# Patient Record
Sex: Female | Born: 1955 | Race: White | Hispanic: No | Marital: Married | Smoking: Former smoker
Health system: Southern US, Community
[De-identification: ages and names within clinical notes are randomized; demographics above are authoritative.]

## PROBLEM LIST (undated history)

## (undated) DIAGNOSIS — E785 Hyperlipidemia, unspecified: Secondary | ICD-10-CM

## (undated) DIAGNOSIS — J302 Other seasonal allergic rhinitis: Secondary | ICD-10-CM

## (undated) DIAGNOSIS — Z8616 Personal history of COVID-19: Secondary | ICD-10-CM

## (undated) DIAGNOSIS — I10 Essential (primary) hypertension: Secondary | ICD-10-CM

## (undated) DIAGNOSIS — K219 Gastro-esophageal reflux disease without esophagitis: Secondary | ICD-10-CM

## (undated) DIAGNOSIS — I2510 Atherosclerotic heart disease of native coronary artery without angina pectoris: Secondary | ICD-10-CM

## (undated) HISTORY — DX: Hyperlipidemia, unspecified: E78.5

## (undated) HISTORY — DX: Personal history of COVID-19: Z86.16

## (undated) HISTORY — DX: Atherosclerotic heart disease of native coronary artery without angina pectoris: I25.10

---

## 1995-03-18 HISTORY — PX: OTHER SURGICAL HISTORY: SHX169

## 2005-10-21 ENCOUNTER — Ambulatory Visit: Payer: Self-pay | Admitting: Internal Medicine

## 2005-10-23 ENCOUNTER — Ambulatory Visit: Payer: Self-pay | Admitting: Internal Medicine

## 2005-10-23 ENCOUNTER — Ambulatory Visit (HOSPITAL_COMMUNITY): Admission: RE | Admit: 2005-10-23 | Discharge: 2005-10-23 | Payer: Self-pay | Admitting: Internal Medicine

## 2005-11-21 ENCOUNTER — Ambulatory Visit: Payer: Self-pay | Admitting: Internal Medicine

## 2010-03-17 HISTORY — PX: BACK SURGERY: SHX140

## 2010-07-12 ENCOUNTER — Ambulatory Visit (HOSPITAL_COMMUNITY)
Admission: RE | Admit: 2010-07-12 | Discharge: 2010-07-12 | Disposition: A | Discharge status: Home / Self Care | Payer: BC Managed Care – PPO | Source: Ambulatory Visit | Attending: Neurosurgery | Admitting: Neurosurgery

## 2010-07-12 ENCOUNTER — Encounter (HOSPITAL_COMMUNITY)
Admission: RE | Admit: 2010-07-12 | Discharge: 2010-07-12 | Disposition: A | Discharge status: Home / Self Care | Payer: BC Managed Care – PPO | Source: Ambulatory Visit | Attending: Neurosurgery | Admitting: Neurosurgery

## 2010-07-12 ENCOUNTER — Other Ambulatory Visit (HOSPITAL_COMMUNITY): Payer: Self-pay | Admitting: Neurosurgery

## 2010-07-12 DIAGNOSIS — R05 Cough: Secondary | ICD-10-CM | POA: Insufficient documentation

## 2010-07-12 DIAGNOSIS — M549 Dorsalgia, unspecified: Secondary | ICD-10-CM

## 2010-07-12 DIAGNOSIS — R059 Cough, unspecified: Secondary | ICD-10-CM | POA: Insufficient documentation

## 2010-07-12 DIAGNOSIS — Z01812 Encounter for preprocedural laboratory examination: Secondary | ICD-10-CM | POA: Insufficient documentation

## 2010-07-12 DIAGNOSIS — R0602 Shortness of breath: Secondary | ICD-10-CM | POA: Insufficient documentation

## 2010-07-12 DIAGNOSIS — Z01818 Encounter for other preprocedural examination: Secondary | ICD-10-CM | POA: Insufficient documentation

## 2010-07-12 LAB — BASIC METABOLIC PANEL
BUN: 11 mg/dL (ref 6–23)
CO2: 28 mEq/L (ref 19–32)
Calcium: 10.4 mg/dL (ref 8.4–10.5)
Chloride: 101 mEq/L (ref 96–112)
Creatinine, Ser: 0.88 mg/dL (ref 0.4–1.2)
GFR calc Af Amer: >60 mL/min (ref >60)
GFR calc non Af Amer: >60 mL/min (ref >60)
Glucose, Bld: 96 mg/dL (ref 70–99)
Potassium: 4.7 mEq/L (ref 3.5–5.1)
Sodium: 139 mEq/L (ref 135–145)

## 2010-07-12 LAB — CBC
HCT: 40.2 % (ref 36.0–46.0)
Hemoglobin: 13.3 g/dL (ref 12.0–15.0)
MCH: 28.9 pg (ref 26.0–34.0)
MCHC: 33.1 g/dL (ref 30.0–36.0)
MCV: 87.4 fL (ref 78.0–100.0)
Platelets: 214 10*3/uL (ref 150–400)
RBC: 4.6 MIL/uL (ref 3.87–5.11)
RDW: 13.8 % (ref 11.5–15.5)
WBC: 8.7 10*3/uL (ref 4.0–10.5)

## 2010-07-12 LAB — SURGICAL PCR SCREEN
MRSA, PCR: NEGATIVE
Staphylococcus aureus: NEGATIVE

## 2010-07-12 IMAGING — CR DG CHEST 2V
2 series · 2 of 2 positions shown · non-contrast
Comparison: None

CLINICAL DATA: preop radiograph.  Shortness of breath.  Dry cough.

CHEST - 2 VIEW

[view not recorded (1 of 2)]
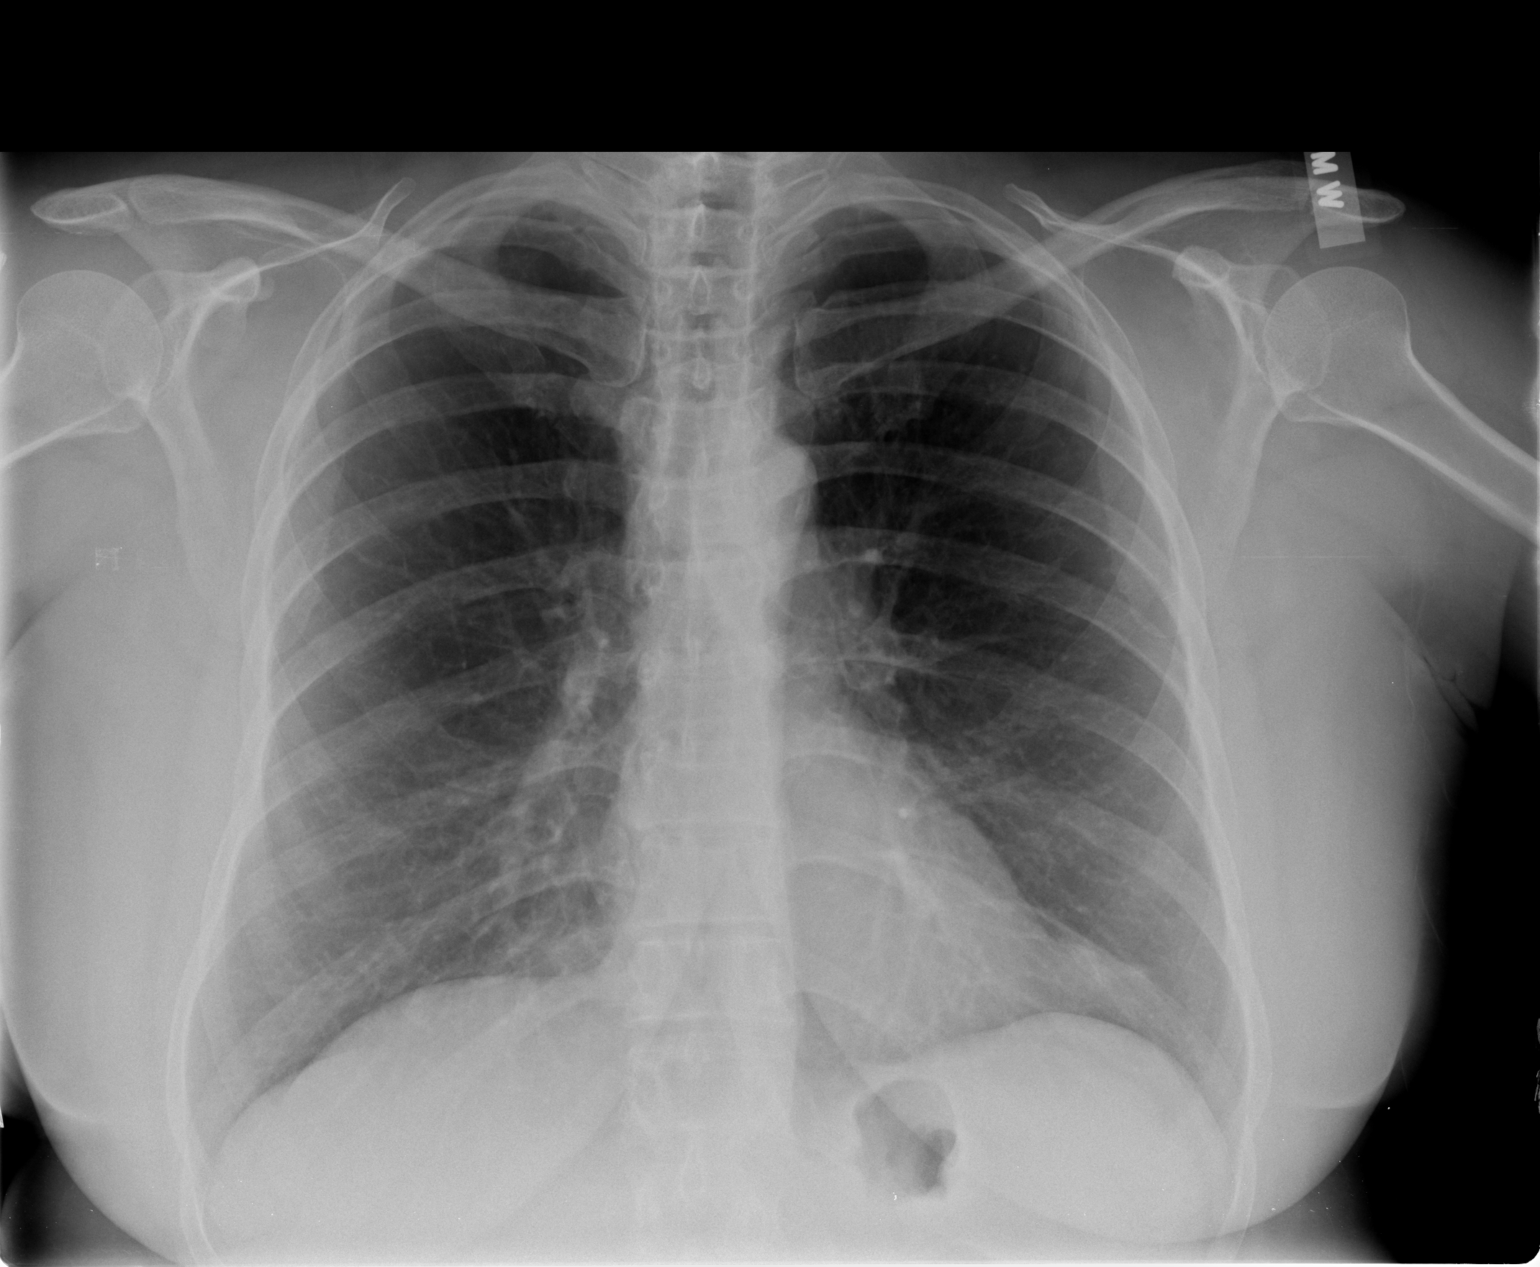

[view not recorded (2 of 2)]
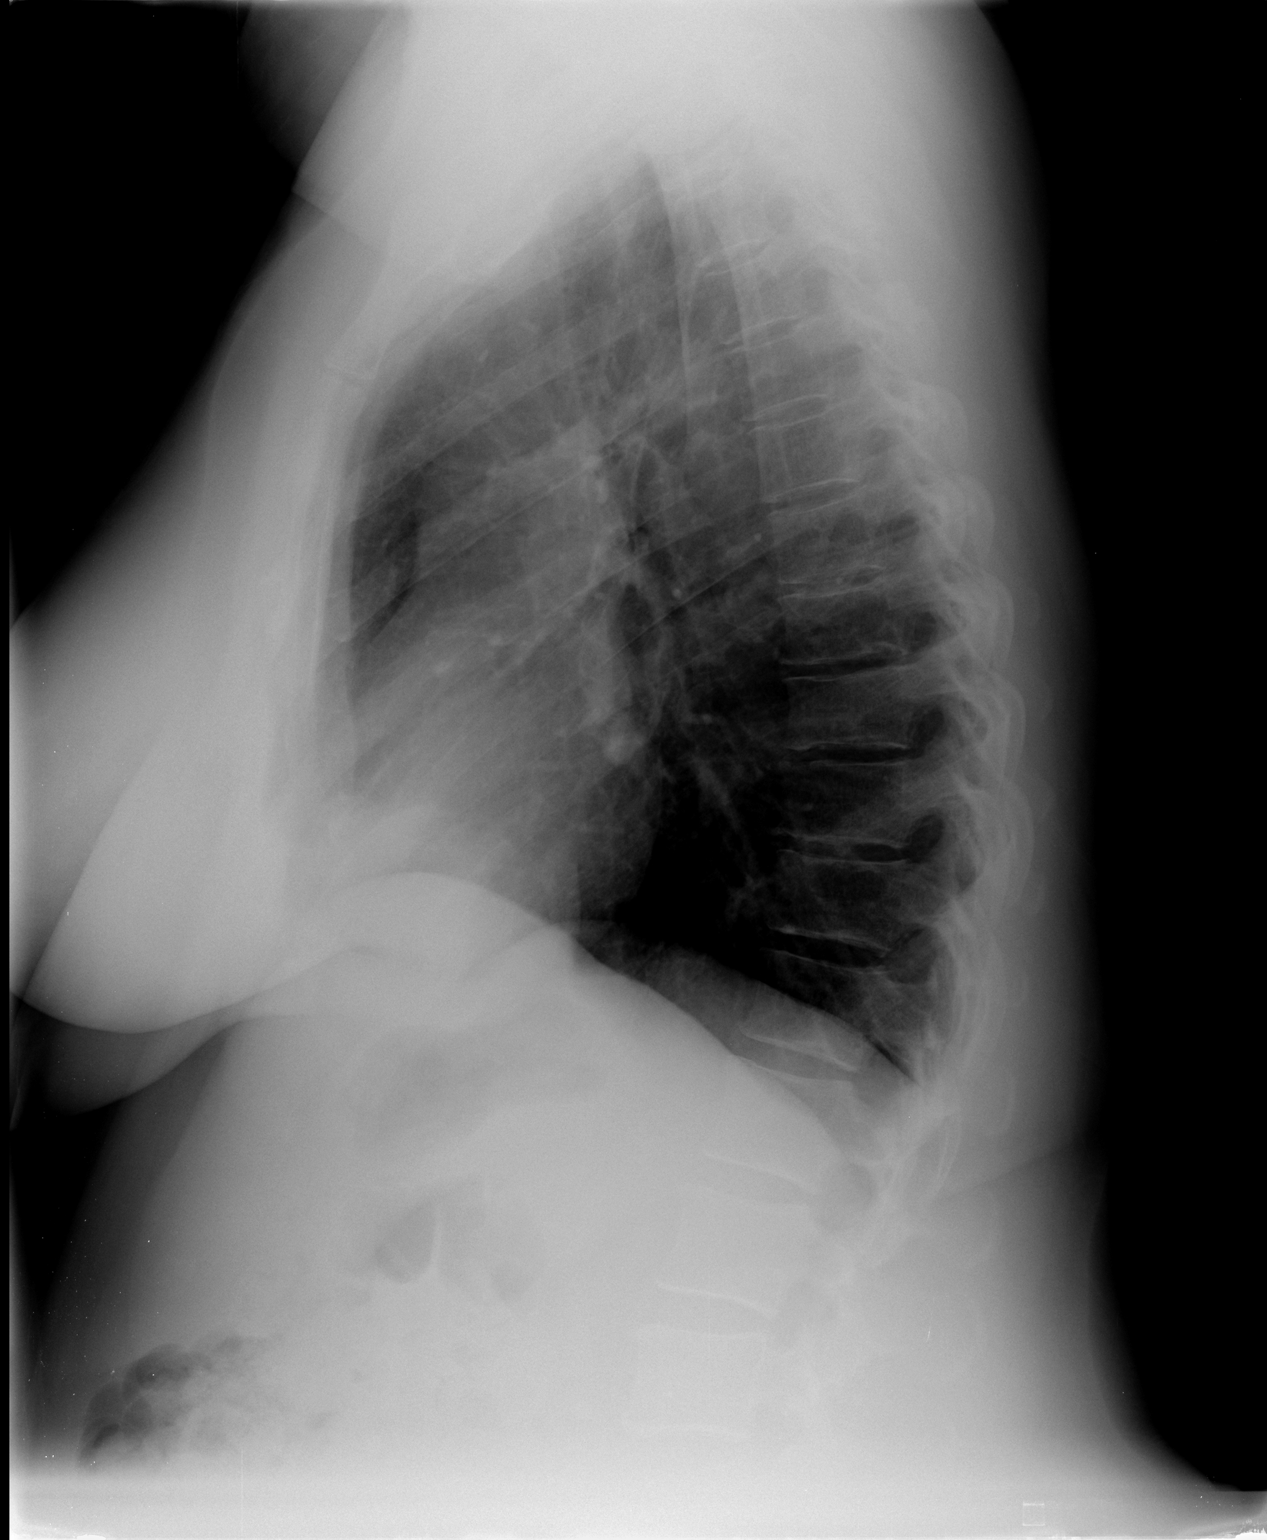

[2 of 2 positions shown; findings below may reference images not displayed]

FINDINGS: The heart size appears normal.

There is no pleural effusion or pulmonary edema

No airspace consolidation identified.

Mildly coarsened interstitial markings are identified bilaterally.

Review of the visualized osseous structures is unremarkable.
IMPRESSION: 1.  No acute cardiopulmonary abnormalities.
2.  Mild chronic coarsened interstitial markings.

## 2010-07-22 ENCOUNTER — Inpatient Hospital Stay (HOSPITAL_COMMUNITY): Payer: BC Managed Care – PPO

## 2010-07-22 ENCOUNTER — Observation Stay (HOSPITAL_COMMUNITY): Payer: BC Managed Care – PPO

## 2010-07-22 ENCOUNTER — Observation Stay (HOSPITAL_COMMUNITY)
Admission: RE | Admit: 2010-07-22 | Discharge: 2010-07-23 | Disposition: A | Discharge status: Home / Self Care | Payer: BC Managed Care – PPO | Source: Ambulatory Visit | Attending: Neurosurgery | Admitting: Neurosurgery

## 2010-07-22 DIAGNOSIS — M47817 Spondylosis without myelopathy or radiculopathy, lumbosacral region: Principal | ICD-10-CM | POA: Insufficient documentation

## 2010-07-22 DIAGNOSIS — M51379 Other intervertebral disc degeneration, lumbosacral region without mention of lumbar back pain or lower extremity pain: Secondary | ICD-10-CM | POA: Insufficient documentation

## 2010-07-22 DIAGNOSIS — M5137 Other intervertebral disc degeneration, lumbosacral region: Secondary | ICD-10-CM | POA: Insufficient documentation

## 2010-07-22 DIAGNOSIS — Q762 Congenital spondylolisthesis: Secondary | ICD-10-CM | POA: Insufficient documentation

## 2010-07-22 LAB — ABO/RH: ABO/RH(D): A POS

## 2010-07-22 LAB — TYPE AND SCREEN
ABO/RH(D): A POS
Antibody Screen: NEGATIVE

## 2010-07-22 IMAGING — CR DG LUMBAR SPINE 2-3V
1 series · 1 of 1 positions shown · non-contrast
Comparison: None.

CLINICAL DATA: L4-5 PLIF.

OPERATIVE LUMBAR SPINE - 2-3 VIEW [DATE]:

[view not recorded]
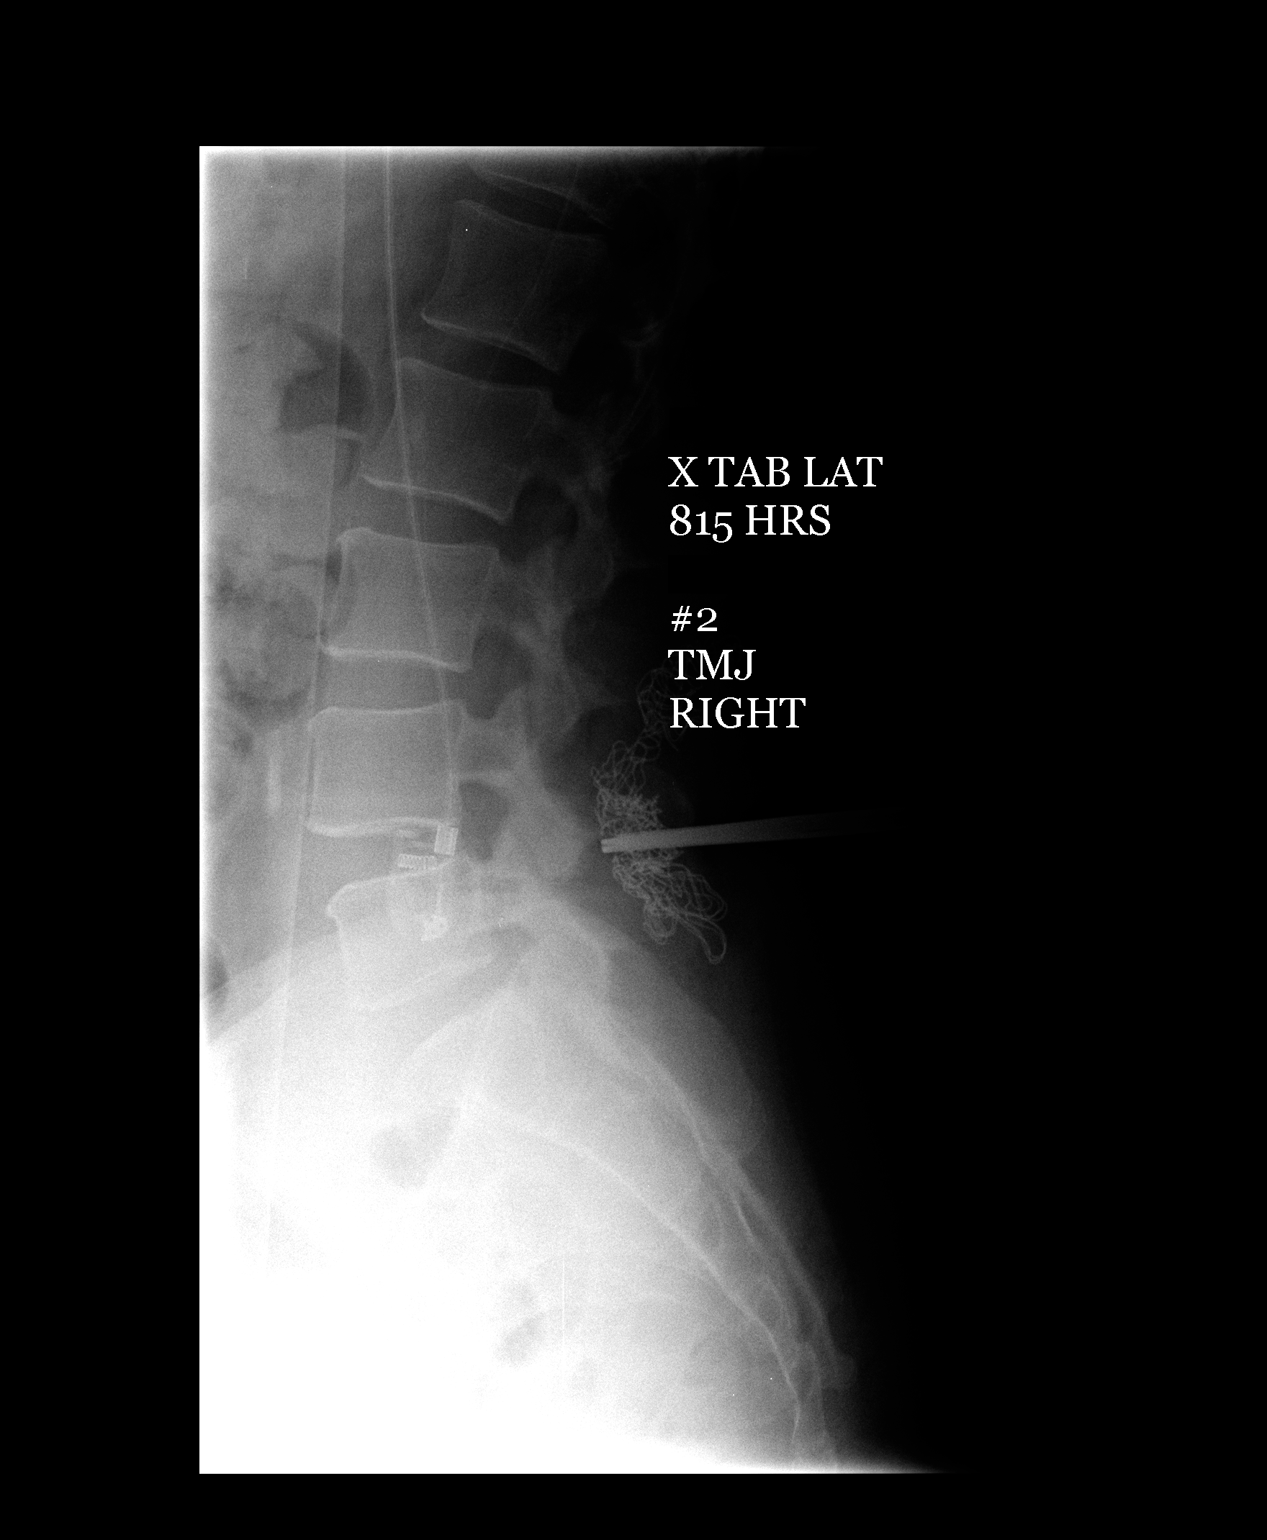

[1 of 1 positions shown; findings below may reference images not displayed]

FINDINGS: The purposes of this dictation, I am going to assume five
non-rib bearing lumbar vertebra.  Images were submitted for
interpretation post-operatively.  Initial image obtained at [DH]
hours demonstrates the localizer needle adjacent to the L3 spinous
process.  Second image obtained at [DH] hours demonstrates the
localizer device directed toward the L4-5 disc space.  A surgical
sponge is noted at the operative site posteriorly.
IMPRESSION: L4-5 localized intraoperatively.

## 2010-07-22 IMAGING — RF DG LUMBAR SPINE 2-3V
1 series · 2 of 2 positions shown · non-contrast
Comparison: None

CLINICAL DATA: L4-5 decompression.

LUMBAR SPINE - 2-3 VIEW

[Series 1: run · 2 of 2 slices shown]
[im 1/2]
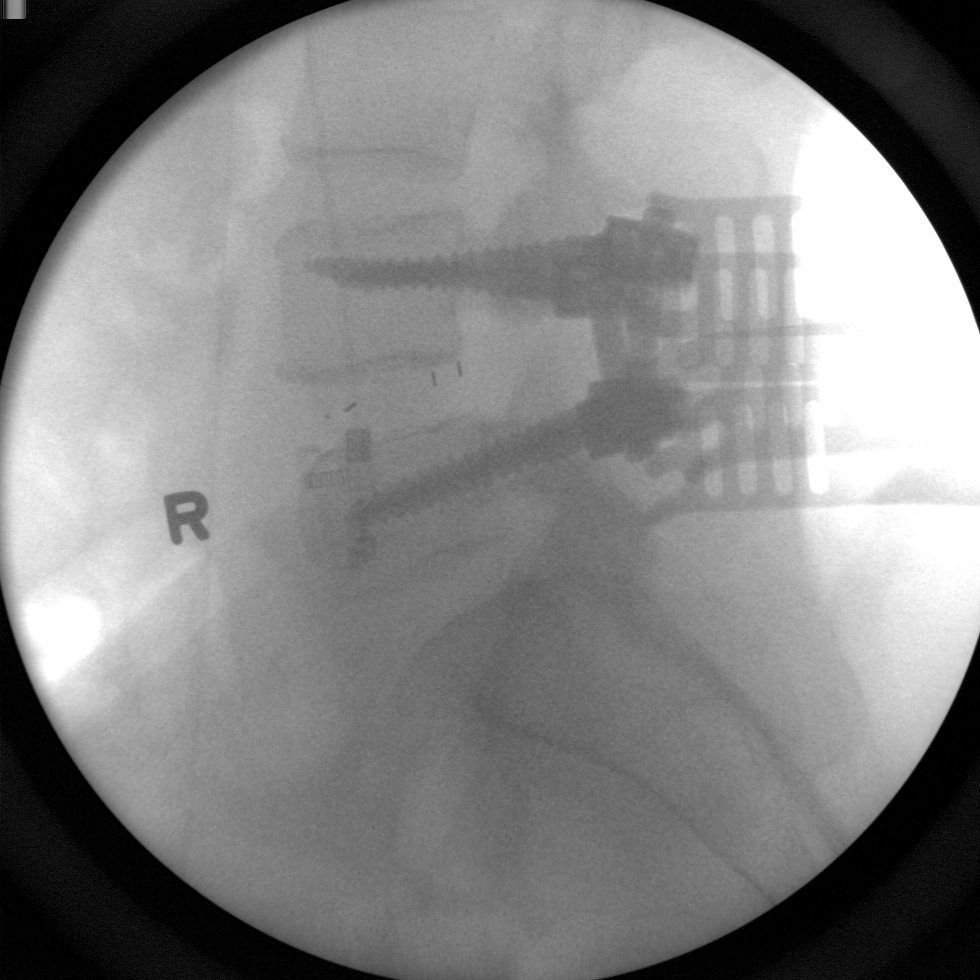
[im 2/2]
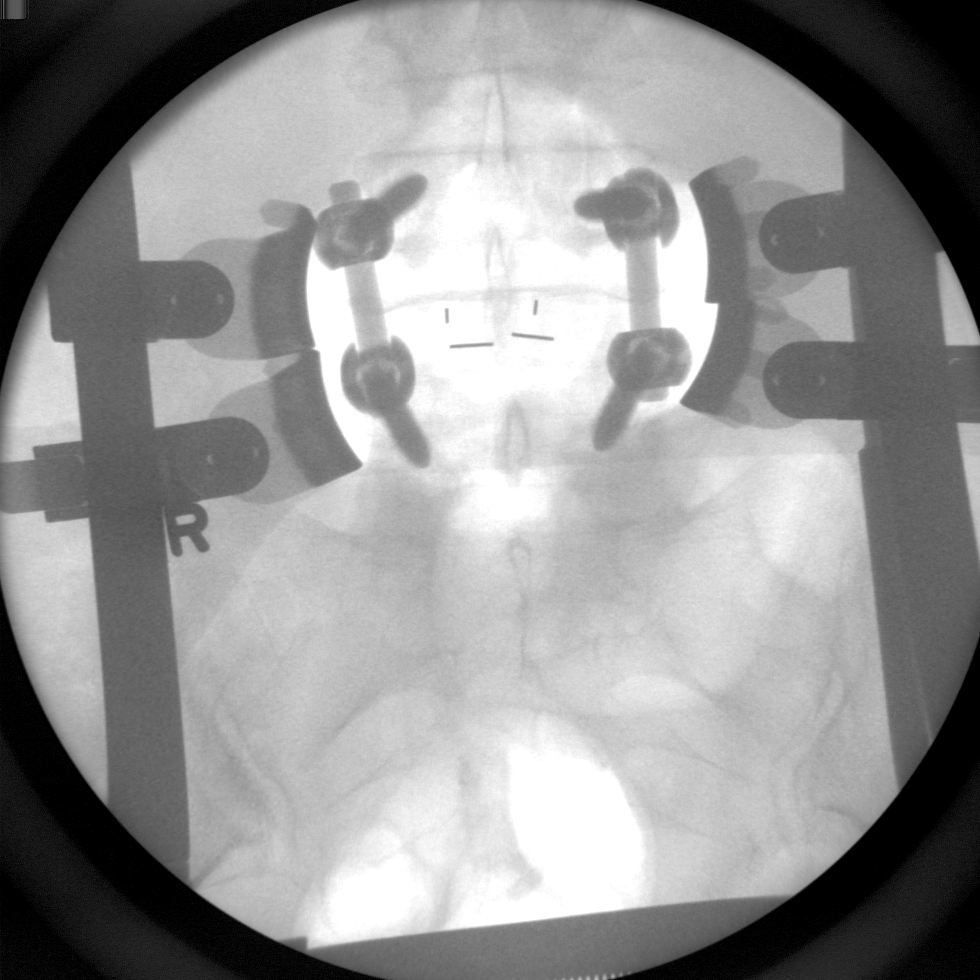

[2 of 2 positions shown; findings below may reference images not displayed]

FINDINGS: Two intraoperative spot images demonstrate changes of
posterior fusion at L4-5.  Normal alignment.  No complicating
feature.
IMPRESSION: L4-5 posterior fusion.

## 2010-07-27 NOTE — Op Note (Signed)
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  POSTOPERATIVE DIAGNOSES:  L4-5 lumbar stenosis, L4-5 dynamic degenerative grade 1 spondylolisthesis, lumbar spondylosis, and lumbar degenerative disk disease.  PROCEDURES:  Bilateral L4-5 lumbar decompression including bilateral lumbar laminotomy, facetectomy, and foraminotomy with decompression of the exiting L4 and L5 nerve roots bilaterally with decompression beyond that required for interbody arthrodesis with the operating microscope and microdissection and microsurgical technique, bilateral L4-5 posterior lumbar interbody arthrodesis with a AVS PEEK interbody implants and Vitoss with bone marrow aspirate and Infuse and bilateral L4-5 posterolateral arthrodesis with radius posterior instrumentation, Vitoss with bone marrow aspirate, and Infuse.  SURGEON:  Hewitt Shorts, MD  ASSISTANT:  Stefani Dama, MD  ANESTHESIA:  General endotracheal.  INDICATIONS:  The patient is a 55 year old woman who presents with right lumbar radicular pain.  She is found to have a grade 1 dynamic degenerative spondylolisthesis of L4-5 with significant stenosis secondary to advance facet arthropathy.  Intraoperatively, we found bilateral synovial cyst emanating from the arthritic facet arthropathy. Decision was made to proceed with decompression and arthrodesis.  PROCEDURE:  The patient was brought to the operating room and placed under general endotracheal anesthesia.  The patient was turned to  prone position.  Lumbar region was prepped with Betadine soap and solution, draped in a sterile fashion.  The midline was infiltrated with local anesthetic with epinephrine.  X-ray was taken and the L4-5 level was identified.  The midline incision was made over the L4-5 level and carried down through the subcutaneous tissue.  Bipolar cautery and electrocautery was used to maintain hemostasis.  Dissection was carried down to the lumbar fascia which was incised bilaterally.  The paraspinal muscles were dissected from the spinous process and lamina in a subperiosteal fashion.  Self-retaining retractor was placed in the L4-5. Anterior laminar space was identified and then we proceeded with decompression using the operating microscope and microdissection and microsurgical technique.  Bilateral laminotomy, facetectomy, and foraminotomy was performed.  Ligamentum flavum was carefully removed. There were bilateral synovial cysts.  The cyst on the left side was somewhat adherent to the dura and a small rent in the dura was closed aswe removed the synovial cyst.  This was closed with 6-0 Prolene suture. At the end of procedure, we laid a layer of Durafoam over the dural repair.  We continued decompression laterally exposing the exiting L5 nerve roots.  Medial facetectomy was performed.  A complete L4 inferior facetectomy was performed.  We then removed the thickened ligamentum flavum laterally exposing and decompressing the L4 nerve roots bilaterally and good decompression of the thecal sac and nerve roots achieved.  We then proceeded with the interbody arthrodesis.  Epidural veins  and the ventral epidural space were coagulated and divided and then we identified the annulus and it was incised bilaterally and the disk space entered.  A thorough diskectomy was performed using variety of pituitary rongeurs and microcurettes.  The cartilaginous endplates were carefully removed using paddle curettes  and we sized the interbody space using sizers and selected 12-mm in height PEEK implants.  We then did brought the C-arm fluoroscope into the field.  It was draped in a sterile fashion and using C-arm fluoroscopic guidance we identified pedicle entry sites for L4 and L5 bilaterally.  We first probed the left L5 pedicle, aspirated bone marrow aspirate, and was injected over 10 mL strip of Vitoss.  We then went ahead and probed the other three pedicles.  Each was examined with the ball probe.  Good bony surfaces were noted.  Each was tapped with a 5.25-mm tap.  Good threading was noted.  Good bony surfaces noted and then we placed 5.75 x 40 mm screws bilaterally at each level.  We then packed the PEEK implants with a combination of Vitoss with bone marrow aspirate and infuse.  We placed the first implant on the right side.  It was countersunk.  We then went to the left side, packed the midline with a combination of Infuse and Vitoss with bone marrow aspirate and then placed the left-sided implant again retracting thecal sac and nerve root away of the way.  We packed the interbody space lateral to each of the PEEK implants with additional Vitoss with bone marrow aspirates.  We then packed the lateral gutter overlying the transverse process of L4 and L5 which had been previously exposed and decorticated and intertransverse space with a combination of Infuse and Vitoss with bone marrow aspirate.  We then selected 40-mm rods.  They were preloaded.  They were positioned within the screw heads and secured with locking caps and once all four locking caps were placed, final tightening was performed against counter torque.  The wound was irrigated extensively with bacitracin solution. Hemostasis was established and confirmed.  The Durafoam was placed over the dural repair and then we proceeded with closure.  Deep fascia was closed with interrupted undyed #1 Vicryl sutures.  Scarpa fascia  was closed with interrupted undyed #1 Vicryl sutures.  Subcutaneous and subcuticular were closed with interrupted inverted 2-0 undyed Vicryl sutures.  Skin was reapproximated with Dermabond.  Once Dermabond was dried, a layer of gauze and 4-inch Hypafix was applied as a dressing. Following surgery, the patient was turned back to supine position, reversed from the anesthetic, extubated, and transferred to the recovery room for further care where she was noted removing all 4 extremities to command.  Estimated blood loss was 150 mL.  We did use a Cell Saver during the procedure, but the Cell Saver technician felt that there was insufficient blood loss to process the collected blood.  Sponge and needle counts were correct.     Hewitt Shorts, M.D.     RWN/MEDQ  D:  07/22/2010  T:  07/23/2010  Job:  [id number]  Electronically Signed by Shirlean Kelly M.D. on 07/27/2010 11:03:45 AM

## 2011-03-18 HISTORY — PX: VEIN SURGERY: SHX48

## 2012-03-17 HISTORY — PX: ABDOMINAL HYSTERECTOMY: SHX81

## 2015-09-24 ENCOUNTER — Telehealth: Payer: Self-pay | Admitting: Internal Medicine

## 2015-09-24 NOTE — Telephone Encounter (Signed)
RECALL FOR TCS °

## 2015-09-25 NOTE — Telephone Encounter (Signed)
Tried to call with no answer  

## 2015-09-25 NOTE — Telephone Encounter (Signed)
Letter mailed for pt to call.  

## 2018-11-29 ENCOUNTER — Encounter: Payer: Self-pay | Admitting: *Deleted

## 2018-12-20 ENCOUNTER — Ambulatory Visit (INDEPENDENT_AMBULATORY_CARE_PROVIDER_SITE_OTHER): Payer: Self-pay | Admitting: *Deleted

## 2018-12-20 ENCOUNTER — Other Ambulatory Visit: Payer: Self-pay

## 2018-12-20 DIAGNOSIS — Z1211 Encounter for screening for malignant neoplasm of colon: Secondary | ICD-10-CM

## 2018-12-20 MED ORDER — PEG 3350-KCL-NA BICARB-NACL 420 G PO SOLR: 4000.0000 mL | Freq: Once | ORAL | 0 refills | Status: AC

## 2018-12-20 NOTE — Progress Notes (Addendum)
Gastroenterology Pre-Procedure Review  Request Date: 12/20/2018 Requesting Physician: Etter Sjogren, NP @ [facility], Last TCS 2007 done by [facility], no report available  PATIENT REVIEW QUESTIONS: The patient responded to the following health history questions as indicated:    1. Diabetes Melitis: no 2. Joint replacements in the past 12 months: no 3. Major health problems in the past 3 months: no 4. Has an artificial valve or MVP: no 5. Has a defibrillator: no 6. Has been advised in past to take antibiotics in advance of a procedure like teeth cleaning: no 7. Family history of colon cancer: no  8. Alcohol Use: no 9. Illicit drug Use: no 10. History of sleep apnea: no  11. History of coronary artery or other vascular stents placed within the last 12 months: no 12. History of any prior anesthesia complications: no 13. There is no height or weight on file to calculate BMI. ht: 5'0 wt: 190 lbs    MEDICATIONS & ALLERGIES:    Patient reports the following regarding taking any blood thinners:   Plavix? no Aspirin? yes Coumadin? no Brilinta? no Xarelto? no Eliquis? no Pradaxa? no Savaysa? no Effient? no  Patient confirms/reports the following medications:  Current Outpatient Medications  Medication Sig Dispense Refill  . aspirin EC 81 MG tablet Take 81 mg by mouth daily.    . cetirizine (ZYRTEC) 10 MG tablet Take 10 mg by mouth daily.    . Cholecalciferol (VITAMIN D3) 50 MCG (2000 UT) TABS Take by mouth daily.    Marland Kitchen estradiol (ESTRACE) 0.5 MG tablet Take 0.5 mg by mouth daily.    Marland Kitchen lisinopril-hydrochlorothiazide (ZESTORETIC) 20-25 MG tablet Take 1 tablet by mouth daily.    . Potassium Aminobenzoate 500 MG CAPS Take by mouth daily.    . solifenacin (VESICARE) 10 MG tablet Take by mouth daily.    . Turmeric (QC TUMERIC COMPLEX) 500 MG CAPS Take by mouth daily.    Marland Kitchen UNABLE TO FIND 1 tablet daily. Vari-Gone     No current facility-administered medications for this visit.      Patient confirms/reports the following allergies:  Allergies  Allergen Reactions  . Penicillins Hives  . Vitamin B12 Nausea And Vomiting  . Codeine Palpitations    Other reaction(s): Irregular Heart Rate    No orders of the defined types were placed in this encounter.   AUTHORIZATION INFORMATION Primary Insurance: Medicare ,  ID [id number] Pre-Cert / Auth required: No, not required  Secondary Insurance: BCBS Supplement,  ID [id number] ,  Group #: [id number] Pre-Cert / Auth required: Not required per [facility] / Auth #: [id number]  SCHEDULE INFORMATION: Procedure has been scheduled as follows:  Date: 02/16/2019, Time: 2:00 Location: [facility] with Dr. Gala Romney  This Gastroenterology Pre-Precedure Review Form is being routed to the following provider(s): Neil Crouch, PA-C

## 2018-12-20 NOTE — Patient Instructions (Signed)
Carmen Morgan   65/17/1957 MRN: [id number]    Procedure Date: 02/16/2019 Time to register: 1:00 pm Place to register: [facility] Procedure Time: 2:00 pm Scheduled provider: Dr. Gala Romney  PREPARATION FOR COLONOSCOPY WITH TRI-LYTE SPLIT PREP  Please notify us immediately if you are diabetic, take iron supplements, or if you are on Coumadin or any other blood thinners.   You will need to purchase 1 fleet enema and 1 box of Bisacodyl 70m tablets.   2 DAYS BEFORE PROCEDURE:  DATE: 02/14/2019   DAY: Monday Begin clear liquid diet AFTER your lunch meal. NO SOLID FOODS after this point.  1 DAY BEFORE PROCEDURE:  DATE: 02/15/2019   DAY: Tuesday Continue clear liquids the entire day - NO SOLID FOOD.   At 2:00 pm:  Take 2 Bisacodyl tablets.   At 4:00pm:  Start drinking your solution. Make sure you mix well per instructions on the bottle. Try to drink 1 (one) 8 ounce glass every 10-15 minutes until you have consumed HALF the jug. You should complete by 6:00pm.You must keep the left over solution refrigerated until completed next day.  Continue clear liquids. You must drink plenty of clear liquids to prevent dehyration and kidney failure.     DAY OF PROCEDURE:   DATE: 02/16/2019   DAY: Wednesday If you take medications for your heart, blood pressure or breathing, you may take these medications.   Five hours before your procedure time @ 9:00 am:  Finish remaining amout of bowel prep, drinking 1 (one) 8 ounce glass every 10-15 minutes until complete. You have two hours to consume remaining prep.   Three hours before your procedure time @ 11:00 am:  Nothing by mouth.   At least one hour before going to the hospital:  Give yourself one Fleet enema. You may take your morning medications with sip of water unless we have instructed otherwise.      Please see below for Dietary Information.  CLEAR LIQUIDS INCLUDE:  Water Jello (NOT red in color)   Ice Popsicles (NOT red in color)    Tea (sugar ok, no milk/cream) Powdered fruit flavored drinks  Coffee (sugar ok, no milk/cream) Gatorade/ Lemonade/ Kool-Aid  (NOT red in color)   Juice: apple, white grape, white cranberry Soft drinks  Clear bullion, consomme, broth (fat free beef/chicken/vegetable)  Carbonated beverages (any kind)  Strained chicken noodle soup Hard Candy   Remember: Clear liquids are liquids that will allow you to see your fingers on the other side of a clear glass. Be sure liquids are NOT red in color, and not cloudy, but CLEAR.  DO NOT EAT OR DRINK ANY OF THE FOLLOWING:  Dairy products of any kind   Cranberry juice Tomato juice / V8 juice   Grapefruit juice Orange juice     Red grape juice  Do not eat any solid foods, including such foods as: cereal, oatmeal, yogurt, fruits, vegetables, creamed soups, eggs, bread, crackers, pureed foods in a blender, etc.   HELPFUL HINTS FOR DRINKING PREP SOLUTION:   Make sure prep is extremely cold. Mix and refrigerate the the morning of the prep. You may also put in the freezer.   You may try mixing some Crystal Light or Country Time Lemonade if you prefer. Mix in small amounts; add more if necessary.  Try drinking through a straw  Rinse mouth with water or a mouthwash between glasses, to remove after-taste.  Try sipping on a cold beverage /ice/ popsicles between glasses of  prep.  Place a piece of sugar-free hard candy in mouth between glasses.  If you become nauseated, try consuming smaller amounts, or stretch out the time between glasses. Stop for 30-60 minutes, then slowly start back drinking.        OTHER INSTRUCTIONS  You will need a responsible adult at least 65 years of age to accompany you and drive you home. This person must remain in the waiting room during your procedure. The hospital will cancel your procedure if you do not have a responsible adult with you.   1. Wear loose fitting clothing that is easily removed. 2. Leave jewelry and  other valuables at home.  3. Remove all body piercing jewelry and leave at home. 4. Total time from sign-in until discharge is approximately 2-3 hours. 5. You should go home directly after your procedure and rest. You can resume normal activities the day after your procedure. 6. The day of your procedure you should not:  Drive  Make legal decisions  Operate machinery  Drink alcohol  Return to work   You may call the office (Dept: [phone]) before 5:00pm, or page the doctor on call ([phone]) after 5:00pm, for further instructions, if necessary.   Insurance Information YOU WILL NEED TO CHECK WITH YOUR INSURANCE COMPANY FOR THE BENEFITS OF COVERAGE YOU HAVE FOR THIS PROCEDURE.  UNFORTUNATELY, NOT ALL INSURANCE COMPANIES HAVE BENEFITS TO COVER ALL OR PART OF THESE TYPES OF PROCEDURES.  IT IS YOUR RESPONSIBILITY TO CHECK YOUR BENEFITS, HOWEVER, WE WILL BE GLAD TO ASSIST YOU WITH ANY CODES YOUR INSURANCE COMPANY MAY NEED.    PLEASE NOTE THAT MOST INSURANCE COMPANIES WILL NOT COVER A SCREENING COLONOSCOPY FOR PEOPLE UNDER THE AGE OF 65  IF YOU HAVE BCBS INSURANCE, YOU MAY HAVE BENEFITS FOR A SCREENING COLONOSCOPY BUT IF POLYPS ARE FOUND THE DIAGNOSIS WILL CHANGE AND THEN YOU MAY HAVE A DEDUCTIBLE THAT WILL NEED TO BE MET. SO PLEASE MAKE SURE YOU CHECK YOUR BENEFITS FOR A SCREENING COLONOSCOPY AS WELL AS A DIAGNOSTIC COLONOSCOPY.  

## 2018-12-21 NOTE — Progress Notes (Signed)
Ok to schedule.

## 2018-12-22 NOTE — Addendum Note (Signed)
Addended by: Metro Kung on: 12/22/2018 09:32 AM   Modules accepted: Orders, SmartSet

## 2019-02-08 ENCOUNTER — Telehealth: Payer: Self-pay | Admitting: *Deleted

## 2019-02-08 NOTE — Telephone Encounter (Signed)
Noted  

## 2019-02-08 NOTE — Telephone Encounter (Signed)
Patient called and said disregard, her pharmacy has her prep

## 2019-02-08 NOTE — Telephone Encounter (Signed)
[phone]  PATIENT CALLED AND SAID THAT HER PHARMACY DOES NOT HAVE HER PREP

## 2019-02-14 ENCOUNTER — Other Ambulatory Visit (HOSPITAL_COMMUNITY)
Admission: RE | Admit: 2019-02-14 | Discharge: 2019-02-14 | Disposition: A | Discharge status: Home / Self Care | Payer: Medicare Other | Source: Ambulatory Visit | Attending: Internal Medicine | Admitting: Internal Medicine

## 2019-02-14 ENCOUNTER — Other Ambulatory Visit: Payer: Self-pay

## 2019-02-14 DIAGNOSIS — Z20828 Contact with and (suspected) exposure to other viral communicable diseases: Secondary | ICD-10-CM | POA: Insufficient documentation

## 2019-02-14 DIAGNOSIS — Z01812 Encounter for preprocedural laboratory examination: Secondary | ICD-10-CM | POA: Insufficient documentation

## 2019-02-14 LAB — SARS CORONAVIRUS 2 (TAT 6-24 HRS): SARS Coronavirus 2: NEGATIVE

## 2019-02-15 ENCOUNTER — Telehealth: Payer: Self-pay | Admitting: *Deleted

## 2019-02-15 NOTE — Telephone Encounter (Signed)
Spoke with pt this morning and she is aware that Day Surgery would like her to come in at 8:30 for her procedure due to a cancellation that they had.  Reviewed all necessary time changes on her prep instructions.  Pt voiced understanding.  Melanie in Endo notified that pt will be there at 8:30.

## 2019-02-16 ENCOUNTER — Other Ambulatory Visit: Payer: Self-pay

## 2019-02-16 ENCOUNTER — Encounter (HOSPITAL_COMMUNITY)
Admission: RE | Disposition: A | Discharge status: Home / Self Care | Payer: Self-pay | Source: Home / Self Care | Attending: Internal Medicine

## 2019-02-16 ENCOUNTER — Encounter (HOSPITAL_COMMUNITY): Payer: Self-pay | Admitting: *Deleted

## 2019-02-16 ENCOUNTER — Ambulatory Visit (HOSPITAL_COMMUNITY)
Admission: RE | Admit: 2019-02-16 | Discharge: 2019-02-16 | Disposition: A | Discharge status: Home / Self Care | Payer: Medicare Other | Attending: Internal Medicine | Admitting: Internal Medicine

## 2019-02-16 DIAGNOSIS — Z1211 Encounter for screening for malignant neoplasm of colon: Secondary | ICD-10-CM | POA: Diagnosis present

## 2019-02-16 DIAGNOSIS — I10 Essential (primary) hypertension: Secondary | ICD-10-CM | POA: Diagnosis not present

## 2019-02-16 DIAGNOSIS — Z79899 Other long term (current) drug therapy: Secondary | ICD-10-CM | POA: Insufficient documentation

## 2019-02-16 DIAGNOSIS — K219 Gastro-esophageal reflux disease without esophagitis: Secondary | ICD-10-CM | POA: Diagnosis not present

## 2019-02-16 DIAGNOSIS — K5730 Diverticulosis of large intestine without perforation or abscess without bleeding: Secondary | ICD-10-CM | POA: Diagnosis not present

## 2019-02-16 DIAGNOSIS — Z7982 Long term (current) use of aspirin: Secondary | ICD-10-CM | POA: Insufficient documentation

## 2019-02-16 DIAGNOSIS — Z87891 Personal history of nicotine dependence: Secondary | ICD-10-CM | POA: Insufficient documentation

## 2019-02-16 DIAGNOSIS — Z7951 Long term (current) use of inhaled steroids: Secondary | ICD-10-CM | POA: Insufficient documentation

## 2019-02-16 HISTORY — DX: Essential (primary) hypertension: I10

## 2019-02-16 HISTORY — DX: Other seasonal allergic rhinitis: J30.2

## 2019-02-16 HISTORY — DX: Gastro-esophageal reflux disease without esophagitis: K21.9

## 2019-02-16 HISTORY — PX: COLONOSCOPY: SHX5424

## 2019-02-16 SURGERY — COLONOSCOPY
Anesthesia: Moderate Sedation

## 2019-02-16 MED ORDER — SODIUM CHLORIDE 0.9 % IV SOLN
INTRAVENOUS | Status: DC
  Administered 2019-02-16: 09:00:00 via INTRAVENOUS

## 2019-02-16 MED ORDER — ONDANSETRON HCL 4 MG/2ML IJ SOLN
INTRAMUSCULAR | Status: DC | PRN
  Administered 2019-02-16: 4 mg via INTRAVENOUS

## 2019-02-16 MED ORDER — MIDAZOLAM HCL 5 MG/5ML IJ SOLN
INTRAMUSCULAR | Status: AC
  Filled 2019-02-16: qty 10

## 2019-02-16 MED ORDER — MIDAZOLAM HCL 5 MG/5ML IJ SOLN
INTRAMUSCULAR | Status: DC | PRN
  Administered 2019-02-16 (×2): 1 mg via INTRAVENOUS
  Administered 2019-02-16: 2 mg via INTRAVENOUS
  Administered 2019-02-16 (×2): 1 mg via INTRAVENOUS
  Administered 2019-02-16: 2 mg via INTRAVENOUS

## 2019-02-16 MED ORDER — MEPERIDINE HCL 100 MG/ML IJ SOLN
INTRAMUSCULAR | Status: DC | PRN
  Administered 2019-02-16: 10 mg via INTRAVENOUS
  Administered 2019-02-16: 25 mg via INTRAVENOUS
  Administered 2019-02-16: 15 mg via INTRAVENOUS

## 2019-02-16 MED ORDER — MEPERIDINE HCL 50 MG/ML IJ SOLN
INTRAMUSCULAR | Status: AC
  Filled 2019-02-16: qty 1

## 2019-02-16 MED ORDER — ONDANSETRON HCL 4 MG/2ML IJ SOLN
INTRAMUSCULAR | Status: AC
  Filled 2019-02-16: qty 2

## 2019-02-16 NOTE — Op Note (Signed)
[facility] Patient Name: Carmen Morgan Procedure Date: 02/16/2019 9:01 AM MRN: [id number] Date of Birth: [birth year] Attending MD: Norvel Richards , MD CSN: [id number] Age: 63 Admit Type: Outpatient Procedure:                Colonoscopy Indications:              Screening for colorectal malignant neoplasm Providers:                Norvel Richards, MD, Charlsie Quest. Theda Sers RN, RN,                            Aram Candela Referring MD:              Medicines:                Midazolam 8 mg IV, Meperidine 50 mg IV, Ondansetron                            4 mg IV Complications:            No immediate complications. Estimated Blood Loss:     Estimated blood loss: none. Procedure:                Pre-Anesthesia Assessment:                           - Prior to the procedure, a History and Physical                            was performed, and patient medications and                            allergies were reviewed. The patient's tolerance of                            previous anesthesia was also reviewed. The risks                            and benefits of the procedure and the sedation                            options and risks were discussed with the patient.                            All questions were answered, and informed consent                            was obtained. Prior Anticoagulants: The patient has                            taken no previous anticoagulant or antiplatelet                            agents. ASA Grade Assessment: II - A patient with  mild systemic disease. After reviewing the risks                            and benefits, the patient was deemed in                            satisfactory condition to undergo the procedure.                           After obtaining informed consent, the colonoscope                            was passed under direct vision. Throughout the                            procedure, the patient's  blood pressure, pulse, and                            oxygen saturations were monitored continuously. The                            CF-[id number] scope was introduced through                            the anus and advanced to the the cecum, identified                            by appendiceal orifice and ileocecal valve. The                            colonoscopy was performed without difficulty. The                            patient tolerated the procedure well. The quality                            of the bowel preparation was adequate. The                            ileocecal valve, appendiceal orifice, and rectum                            were photographed. Scope In: 9:21:45 AM Scope Out: 9:41:06 AM Scope Withdrawal Time: 0 hours 7 minutes 39 seconds  Total Procedure Duration: 0 hours 19 minutes 21 seconds  Findings:      The perianal and digital rectal examinations were normal. Somewhat of a       stiff noncompliant left colon.      Scattered medium-mouthed diverticula were found in the entire colon.      The exam was otherwise without abnormality on direct and retroflexion       views. Impression:               - Diverticulosis in the entire examined colon.  Noncompliant left colon.                           - The examination was otherwise normal on direct                            and retroflexion views.                           - No specimens collected. Patient related to me she                            is actually had quite a bit of bloating gas thanks                            of not been "right" since she had her hysterectomy. Moderate Sedation:      Moderate (conscious) sedation was administered by the endoscopy nurse       and supervised by the endoscopist. The following parameters were       monitored: oxygen saturation, heart rate, blood pressure, respiratory       rate, EKG, adequacy of pulmonary ventilation, and response to  care. Recommendation:           - Patient has a contact number available for                            emergencies. The signs and symptoms of potential                            delayed complications were discussed with the                            patient. Return to normal activities tomorrow.                            Written discharge instructions were provided to the                            patient.                           - Resume previous diet.                           - Continue present medications.                           - Repeat colonoscopy in 10 years for screening                            purposes.                           - Return to GI office in 2 months to address her  nonspecific GI symptoms.. Procedure Code(s):        --- Professional ---                           [phone], Colonoscopy, flexible; diagnostic, including                            collection of specimen(s) by brushing or washing,                            when performed (separate procedure) Diagnosis Code(s):        --- Professional ---                           Z12.11, Encounter for screening for malignant                            neoplasm of colon                           K57.30, Diverticulosis of large intestine without                            perforation or abscess without bleeding CPT copyright 2019 American [employer] Association. All rights reserved. The codes documented in this report are preliminary and upon coder review may  be revised to meet current compliance requirements. Cristopher Estimable. Letti Towell, MD Norvel Richards, MD 02/16/2019 9:54:17 AM This report has been signed electronically. Number of Addenda: 0

## 2019-02-16 NOTE — Discharge Instructions (Signed)
Colonoscopy Discharge Instructions  Read the instructions outlined below and refer to this sheet in the next few weeks. These discharge instructions provide you with general information on caring for yourself after you leave the hospital. Your doctor may also give you specific instructions. While your treatment has been planned according to the most current medical practices available, unavoidable complications occasionally occur. If you have any problems or questions after discharge, call Dr. Gala Romney at [phone]. ACTIVITY  You may resume your regular activity, but move at a slower pace for the next 24 hours.   Take frequent rest periods for the next 24 hours.   Walking will help get rid of the air and reduce the bloated feeling in your belly (abdomen).   No driving for 24 hours (because of the medicine (anesthesia) used during the test).    Do not sign any important legal documents or operate any machinery for 24 hours (because of the anesthesia used during the test).  NUTRITION  Drink plenty of fluids.   You may resume your normal diet as instructed by your doctor.   Begin with a light meal and progress to your normal diet. Heavy or fried foods are harder to digest and may make you feel sick to your stomach (nauseated).   Avoid alcoholic beverages for 24 hours or as instructed.  MEDICATIONS  You may resume your normal medications unless your doctor tells you otherwise.  WHAT YOU CAN EXPECT TODAY  Some feelings of bloating in the abdomen.   Passage of more gas than usual.   Spotting of blood in your stool or on the toilet paper.  IF YOU HAD POLYPS REMOVED DURING THE COLONOSCOPY:  No aspirin products for 7 days or as instructed.   No alcohol for 7 days or as instructed.   Eat a soft diet for the next 24 hours.  FINDING OUT THE RESULTS OF YOUR TEST Not all test results are available during your visit. If your test results are not back during the visit, make an appointment  with your caregiver to find out the results. Do not assume everything is normal if you have not heard from your caregiver or the medical facility. It is important for you to follow up on all of your test results.  SEEK IMMEDIATE MEDICAL ATTENTION IF:  You have more than a spotting of blood in your stool.   Your belly is swollen (abdominal distention).   You are nauseated or vomiting.   You have a temperature over 101.   You have abdominal pain or discomfort that is severe or gets worse throughout the day.    Diverticulosis information provided  Repeat colonoscopy in 10 years for screening purposes  Office visit with [place] in 2 months  At patient request, I called Raiyah Jankowiak, [phone] -called x2; unable to reach Mr. Draves   Diverticulosis  Diverticulosis is a condition that develops when small pouches (diverticula) form in the wall of the large intestine (colon). The colon is where water is absorbed and stool is formed. The pouches form when the inside layer of the colon pushes through weak spots in the outer layers of the colon. You may have a few pouches or many of them. What are the causes? The cause of this condition is not known. What increases the risk? The following factors may make you more likely to develop this condition:  Being older than age 71. Your risk for this condition increases with age. Diverticulosis is rare among people younger than  age 62. By age 36, many people have it.  Eating a low-fiber diet.  Having frequent constipation.  Being overweight.  Not getting enough exercise.  Smoking.  Taking over-the-counter pain medicines, like aspirin and ibuprofen.  Having a family history of diverticulosis. What are the signs or symptoms? In most people, there are no symptoms of this condition. If you do have symptoms, they may include:  Bloating.  Cramps in the abdomen.  Constipation or diarrhea.  Pain in the lower left side of the abdomen. How is  this diagnosed? This condition is most often diagnosed during an exam for other colon problems. Because diverticulosis usually has no symptoms, it often cannot be diagnosed independently. This condition may be diagnosed by:  Using a flexible scope to examine the colon (colonoscopy).  Taking an X-ray of the colon after dye has been put into the colon (barium enema).  Doing a CT scan. How is this treated? You may not need treatment for this condition if you have never developed an infection related to diverticulosis. If you have had an infection before, treatment may include:  Eating a high-fiber diet. This may include eating more fruits, vegetables, and grains.  Taking a fiber supplement.  Taking a live bacteria supplement (probiotic).  Taking medicine to relax your colon.  Taking antibiotic medicines. Follow these instructions at home:  Drink 6-8 glasses of water or more each day to prevent constipation.  Try not to strain when you have a bowel movement.  If you have had an infection before: ? Eat more fiber as directed by your health care provider or your diet and nutrition specialist (dietitian). ? Take a fiber supplement or probiotic, if your health care provider approves.  Take over-the-counter and prescription medicines only as told by your health care provider.  If you were prescribed an antibiotic, take it as told by your health care provider. Do not stop taking the antibiotic even if you start to feel better.  Keep all follow-up visits as told by your health care provider. This is important. Contact a health care provider if:  You have pain in your abdomen.  You have bloating.  You have cramps.  You have not had a bowel movement in 3 days. Get help right away if:  Your pain gets worse.  Your bloating becomes very bad.  You have a fever or chills, and your symptoms suddenly get worse.  You vomit.  You have bowel movements that are bloody or black.  You  have bleeding from your rectum. Summary  Diverticulosis is a condition that develops when small pouches (diverticula) form in the wall of the large intestine (colon).  You may have a few pouches or many of them.  This condition is most often diagnosed during an exam for other colon problems.  If you have had an infection related to diverticulosis, treatment may include increasing the fiber in your diet, taking supplements, or taking medicines. This information is not intended to replace advice given to you by your health care provider. Make sure you discuss any questions you have with your health care provider. Document Released: 11/29/2003 Document Revised: 02/13/2017 Document Reviewed: 01/21/2016 Elsevier Patient Education  2020 [employer].

## 2019-02-16 NOTE — H&P (Signed)
@LOGO @   Primary Care Physician:  Etter Sjogren, FNP Primary Gastroenterologist:  Dr. Gala Romney  Pre-Procedure History & Physical: HPI:  Carmen Morgan is a 63 y.o. female is here for a screening colonoscopy.  Reported negative colonoscopy 2007-records unavailable.  No family history colon cancer.  No bowel symptoms currently.  Past Medical History:  Diagnosis Date  . GERD (gastroesophageal reflux disease)   . Hypertension   . Seasonal allergies     Past Surgical History:  Procedure Laterality Date  . ABDOMINAL HYSTERECTOMY  2014  . BACK SURGERY  2012  . left breast cystectomy  1997  . VEIN SURGERY  2013    Prior to Admission medications   Medication Sig Start Date End Date Taking? Authorizing Provider  acetaminophen (TYLENOL) 500 MG tablet Take 1,000 mg by mouth every 6 (six) hours as needed for moderate pain or headache.   Yes [provider]  aspirin EC 81 MG tablet Take 81 mg by mouth daily.   Yes [provider]  cetirizine (ZYRTEC) 10 MG tablet Take 10 mg by mouth daily.   Yes [provider]  Cholecalciferol (VITAMIN D3) 50 MCG (2000 UT) TABS Take 2,000 Units by mouth daily.    Yes [provider]  estradiol (ESTRACE) 0.5 MG tablet Take 0.5 mg by mouth at bedtime.    Yes [provider]  famotidine (PEPCID) 20 MG tablet Take 20 mg by mouth daily as needed for heartburn or indigestion.   Yes [provider]  fluticasone (FLONASE) 50 MCG/ACT nasal spray Place 1 spray into both nostrils daily.   Yes [provider]  ibuprofen (ADVIL) 200 MG tablet Take 600-800 mg by mouth every 8 (eight) hours as needed for headache or moderate pain.   Yes [provider]  lisinopril-hydrochlorothiazide (ZESTORETIC) 20-25 MG tablet Take 1 tablet by mouth daily.   Yes [provider]  Melatonin 10 MG CAPS Take 20 mg by mouth at bedtime.   Yes [provider]  OVER THE COUNTER MEDICATION Take 1 capsule by  mouth daily. Vari-Gone otc supplement   Yes [provider]  Potassium Gluconate 550 (90 K) MG TABS Take 550 mg by mouth daily.   Yes [provider]  solifenacin (VESICARE) 10 MG tablet Take 10 mg by mouth daily.    Yes [provider]    Allergies as of 12/22/2018 - Review Complete 12/20/2018  Allergen Reaction Noted  . Penicillins Hives 10/29/2018  . Vitamin b12 Nausea And Vomiting 10/29/2018  . Codeine Palpitations 10/29/2018    Family History  Problem Relation Age of Onset  . Colon cancer Neg Hx     Social History   Socioeconomic History  . Marital status: Married    Spouse name: Not on file  . Number of children: Not on file  . Years of education: Not on file  . Highest education level: Not on file  Occupational History  . Not on file  Social Needs  . Financial resource strain: Not on file  . Food insecurity    Worry: Not on file    Inability: Not on file  . Transportation needs    Medical: Not on file    Non-medical: Not on file  Tobacco Use  . Smoking status: Former Research scientist (life sciences)  . Smokeless tobacco: Never Used  Substance and Sexual Activity  . Alcohol use: Never    Frequency: Never  . Drug use: Never  . Sexual activity: Not on file  Lifestyle  .  Physical activity    Days per week: Not on file    Minutes per session: Not on file  . Stress: Not on file  Relationships  . Social Herbalist on phone: Not on file    Gets together: Not on file    Attends religious service: Not on file    Active member of club or organization: Not on file    Attends meetings of clubs or organizations: Not on file    Relationship status: Not on file  . Intimate partner violence    Fear of current or ex partner: Not on file    Emotionally abused: Not on file    Physically abused: Not on file    Forced sexual activity: Not on file  Other Topics Concern  . Not on file  Social History Narrative  . Not on file    Review of Systems: See HPI,  otherwise negative ROS  Physical Exam: BP 132/77   Pulse 90   Temp 98.2 F (36.8 C) (Oral)   Resp 18   Ht 5' (1.524 m)   Wt 87.5 kg   SpO2 97%   BMI 37.69 kg/m  General:   Alert,  Well-developed, well-nourished, pleasant and cooperative in NAD Head:  Normocephalic and atraumatic. Lungs:  Clear throughout to auscultation.   No wheezes, crackles, or rhonchi. No acute distress. Heart:  Regular rate and rhythm; no murmurs, clicks, rubs,  or gallops. Abdomen:  Soft, nontender and nondistended. No masses, hepatosplenomegaly or hernias noted. Normal bowel sounds, without guarding, and without rebound.     Impression/Plan: Carmen Morgan is now here to undergo a screening colonoscopy.  Average rescreening examination.  Risks, benefits, limitations, imponderables and alternatives regarding colonoscopy have been reviewed with the patient. Questions have been answered. All parties agreeable.     Notice:  This dictation was prepared with Dragon dictation along with smaller phrase technology. Any transcriptional errors that result from this process are unintentional and may not be corrected upon review.

## 2019-02-18 ENCOUNTER — Encounter (HOSPITAL_COMMUNITY): Payer: Self-pay | Admitting: Internal Medicine

## 2019-04-19 ENCOUNTER — Ambulatory Visit: Payer: Medicare Other | Admitting: Nurse Practitioner

## 2019-06-16 ENCOUNTER — Ambulatory Visit (INDEPENDENT_AMBULATORY_CARE_PROVIDER_SITE_OTHER): Payer: Medicare Other | Admitting: Nurse Practitioner

## 2019-06-16 ENCOUNTER — Encounter: Payer: Self-pay | Admitting: Nurse Practitioner

## 2019-06-16 ENCOUNTER — Other Ambulatory Visit: Payer: Self-pay

## 2019-06-16 DIAGNOSIS — K5900 Constipation, unspecified: Secondary | ICD-10-CM | POA: Diagnosis not present

## 2019-06-16 DIAGNOSIS — R109 Unspecified abdominal pain: Secondary | ICD-10-CM

## 2019-06-16 DIAGNOSIS — K625 Hemorrhage of anus and rectum: Secondary | ICD-10-CM | POA: Diagnosis not present

## 2019-06-16 NOTE — Patient Instructions (Signed)
Your health issues we discussed today were:   Abdominal discomfort with constipation and rectal bleeding: 1. Some of your abdominal discomfort may be coming from constipation 2. Your rectal bleeding that you had 4 months ago was likely due to constipation as well 3. Start taking Colace 100 mg once a day, over-the-counter to see if this helps your constipation 4. You can also take MiraLAX once a day to twice a day as needed if Colace is not enough to have more regular bowel movements 5. If you start having diarrhea, back off on the medications 6. Call us if you have any worsening or severe symptoms  Overall I recommend:  1. Continue your other current medications 2. Return for follow-up in 6 months 3. Call us if you have any questions or concerns   ---------------------------------------------------------------  I am glad you got your Covid vaccine, keep your appointment for your second dose tomorrow.  Even when you are fully vaccinated he should continue to wear a mask, socially distance from those you do not live with, and wash your hands frequently.  ---------------------------------------------------------------   At [facility] we value your feedback. You may receive a survey about your visit today. Please share your experience as we strive to create trusting relationships with our patients to provide genuine, compassionate, quality care.  We appreciate your understanding and patience as we review any laboratory studies, imaging, and other diagnostic tests that are ordered as we care for you. Our office policy is 5 business days for review of these results, and any emergent or urgent results are addressed in a timely manner for your best interest. If you do not hear from our office in 1 week, please contact us.   We also encourage the use of MyChart, which contains your medical information for your review as well. If you are not enrolled in this feature, an access code  is on this after visit summary for your convenience. Thank you for allowing [place] to be involved in your care.  It was great to see you today!  I hope you have a great day!!

## 2019-06-16 NOTE — Assessment & Plan Note (Signed)
She had an episode of rectal bleeding in December, about 4 months ago, associated with constipation and straining.  Likely benign anorectal source.  Colonoscopy has recently been updated in early December 2020 and found to be normal with recommended 10-year repeat.  This is reassuring.  Further recommendations for constipation as per above.

## 2019-06-16 NOTE — Assessment & Plan Note (Signed)
The patient did admit mid to lower abdominal discomfort since hysterectomy in 2014.  It is not particularly severe.  She does have constipation going on as per above and this could be playing a role.  Also query possible scar tissue after abdominal hysterectomy.  I will try to treat her constipation as per above and see if this has an improvement.  Further recommendations to follow based on her clinical progression.  Follow-up in 6 months.

## 2019-06-16 NOTE — Assessment & Plan Note (Signed)
The patient describes constipation with a small and hard stools, although she typically has a bowel movement about 4 times a week.  She does not like she empties completely.  Possibly some straining.  Initially denied abdominal pain but towards the end of the visit she did states she has had abdominal discomfort since her hysterectomy but not at the incision.  Query possible scar tissue playing a role.  However, I will try to treat her constipation to see if this improves her abdominal discomfort.  I do not think she needs prescription medication at this time.  I will have her start Colace 100 mg once a day.  She can add MiraLAX 1-2 times a day as needed.  Return for follow-up in 6 months.  Call for any worsening or severe symptoms.

## 2019-06-16 NOTE — Progress Notes (Signed)
Referring Provider: Etter Sjogren, FNP Primary Care Physician:  Etter Sjogren, FNP Primary GI:  Dr. Gala Romney  Chief Complaint  Patient presents with  . Constipation    since TCS in decemeber. Had RB the week of christmas but none since. Has "hard balls"     HPI:   Carmen Morgan is a 64 y.o. female who presents for post procedure follow-up.  The patient was triaged on 12/20/2018 for colonoscopy.  Colonoscopy was completed 02/16/2019 which found diverticulosis in the entire examined colon, otherwise normal.  At that time she noted she has had quite a bit of bloating and gas and has not been "right" since her hysterectomy.  Recommend continue current medications, repeat colonoscopy in 10 years, follow-up in the office in 2 months.  Today she states she's doing ok. Has had constipation since her colonoscopy with small, hard stools consistent with Bristol 1. Prior to hysterectomy in 2014 was having Bristol 4 stools. After hysterectomy had diarrhea 3 times a week. This persisted since until colonoscopy. Has a bowel movement about four times a week, incomplete emptying. No persistent gas or bloating. Denies N/V (other than an episode 4 months ago). Denies hematochezia (other than an episode 4 months ago), melena, fever, chills, unintentional weight loss. Denies URI or flu-like symptoms. Denies loss of sense of taste or smell. Had COVID-19 in January. Had her first vaccine and has second dose tomorrow. Denies chest pain, dyspnea, dizziness, lightheadedness, syncope, near syncope. Denies any other upper or lower GI symptoms.   During the exam she states she has had abdominal soreness since hysterectomy.  Past Medical History:  Diagnosis Date  . GERD (gastroesophageal reflux disease)   . Hypertension   . Seasonal allergies     Past Surgical History:  Procedure Laterality Date  . ABDOMINAL HYSTERECTOMY  2014  . BACK SURGERY  2012  . COLONOSCOPY N/A 02/16/2019   Procedure: COLONOSCOPY;   Surgeon: Daneil Dolin, MD;  Location: [facility];  Service: Endoscopy;  Laterality: N/A;  2:00  . left breast cystectomy  1997  . VEIN SURGERY  2013    Current Outpatient Medications  Medication Sig Dispense Refill  . acetaminophen (TYLENOL) 500 MG tablet Take 1,000 mg by mouth every 6 (six) hours as needed for moderate pain or headache.    Marland Kitchen aspirin EC 81 MG tablet Take 81 mg by mouth daily.    . Cholecalciferol (VITAMIN D3) 50 MCG (2000 UT) TABS Take 2,000 Units by mouth daily.     Marland Kitchen estradiol (ESTRACE) 0.5 MG tablet Take 0.5 mg by mouth at bedtime.     . famotidine (PEPCID) 20 MG tablet Take 20 mg by mouth daily as needed for heartburn or indigestion.    . fexofenadine (ALLEGRA) 180 MG tablet Take 180 mg by mouth daily.    . fluticasone (FLONASE) 50 MCG/ACT nasal spray Place 1 spray into both nostrils daily.    Marland Kitchen ibuprofen (ADVIL) 200 MG tablet Take 600-800 mg by mouth every 8 (eight) hours as needed for headache or moderate pain.    Marland Kitchen lisinopril-hydrochlorothiazide (ZESTORETIC) 20-25 MG tablet Take 1 tablet by mouth daily.    . Melatonin 10 MG CAPS Take 20 mg by mouth at bedtime.    Marland Kitchen OVER THE COUNTER MEDICATION Take 1 capsule by mouth daily. Vari-Gone otc supplement    . Potassium Gluconate 550 (90 K) MG TABS Take 550 mg by mouth daily.    . solifenacin (VESICARE) 10 MG tablet Take 10  mg by mouth daily.      No current facility-administered medications for this visit.    Allergies as of 06/16/2019 - Review Complete 06/16/2019  Allergen Reaction Noted  . Penicillins Hives 10/29/2018  . Vitamin b12 Nausea And Vomiting 10/29/2018  . Codeine Palpitations 10/29/2018    Family History  Problem Relation Age of Onset  . Colon cancer Neg Hx     Social History   Socioeconomic History  . Marital status: Married    Spouse name: Not on file  . Number of children: Not on file  . Years of education: Not on file  . Highest education level: Not on file  Occupational History  .  Not on file  Tobacco Use  . Smoking status: Former Research scientist (life sciences)  . Smokeless tobacco: Never Used  Substance and Sexual Activity  . Alcohol use: Never  . Drug use: Never  . Sexual activity: Not on file  Other Topics Concern  . Not on file  Social History Narrative  . Not on file   Social Determinants of Health   Financial Resource Strain:   . Difficulty of Paying Living Expenses:   Food Insecurity:   . Worried About Charity fundraiser in the Last Year:   . Arboriculturist in the Last Year:   Transportation Needs:   . Film/video editor (Medical):   Marland Kitchen Lack of Transportation (Non-Medical):   Physical Activity:   . Days of Exercise per Week:   . Minutes of Exercise per Session:   Stress:   . Feeling of Stress :   Social Connections:   . Frequency of Communication with Friends and Family:   . Frequency of Social Gatherings with Friends and Family:   . Attends Religious Services:   . Active Member of Clubs or Organizations:   . Attends Archivist Meetings:   Marland Kitchen Marital Status:     Subjective: Review of Systems  Constitutional: Negative for chills, fever, malaise/fatigue and weight loss.  HENT: Negative for congestion and sore throat.   Respiratory: Negative for cough and shortness of breath.   Cardiovascular: Negative for chest pain and palpitations.  Gastrointestinal: Positive for blood in stool (last episode 4 months ago) and constipation. Negative for abdominal pain, diarrhea, melena, nausea and vomiting.  Musculoskeletal: Negative for joint pain and myalgias.  Skin: Negative for rash.  Neurological: Negative for dizziness and weakness.  Endo/Heme/Allergies: Does not bruise/bleed easily.  Psychiatric/Behavioral: Negative for depression. The patient is not nervous/anxious.   All other systems reviewed and are negative.    Objective: BP (!) 151/71   Pulse 86   Temp (!) 97.5 F (36.4 C) (Oral)   Ht 5\' 2"  (1.575 m)   Wt 190 lb 3.2 oz (86.3 kg)   BMI 34.79  kg/m  Physical Exam Vitals and nursing note reviewed.  Constitutional:      General: She is not in acute distress.    Appearance: Normal appearance. She is well-developed. She is obese. She is not ill-appearing, toxic-appearing or diaphoretic.  HENT:     Head: Normocephalic and atraumatic.  Eyes:     General: No scleral icterus. Cardiovascular:     Rate and Rhythm: Normal rate and regular rhythm.     Heart sounds: Normal heart sounds.  Pulmonary:     Effort: Pulmonary effort is normal. No respiratory distress.     Breath sounds: Normal breath sounds.  Abdominal:     General: Bowel sounds are normal.  Palpations: Abdomen is soft. There is no hepatomegaly, splenomegaly or mass.     Tenderness: There is abdominal tenderness in the periumbilical area. There is no guarding or rebound.     Hernia: No hernia is present.     Comments: Mid-abdominal mild soreness  Skin:    General: Skin is warm and dry.     Coloration: Skin is not jaundiced.     Findings: No rash.  Neurological:     General: No focal deficit present.     Mental Status: She is alert and oriented to person, place, and time.  Psychiatric:        Attention and Perception: Attention normal.        Mood and Affect: Mood normal.        Speech: Speech normal.        Behavior: Behavior normal.        Thought Content: Thought content normal.        Cognition and Memory: Cognition and memory normal.       06/16/2019 10:45 AM   Disclaimer: This note was dictated with voice recognition software. Similar sounding words can inadvertently be transcribed and may not be corrected upon review.

## 2019-06-16 NOTE — Progress Notes (Signed)
Cc'ed to pcp °

## 2019-12-15 ENCOUNTER — Other Ambulatory Visit: Payer: Self-pay

## 2019-12-15 ENCOUNTER — Encounter: Payer: Self-pay | Admitting: Nurse Practitioner

## 2019-12-15 ENCOUNTER — Telehealth: Payer: Self-pay | Admitting: *Deleted

## 2019-12-15 ENCOUNTER — Ambulatory Visit (INDEPENDENT_AMBULATORY_CARE_PROVIDER_SITE_OTHER): Payer: Medicare Other | Admitting: Nurse Practitioner

## 2019-12-15 ENCOUNTER — Telehealth: Payer: Self-pay | Admitting: Internal Medicine

## 2019-12-15 VITALS — BP 145/73 | HR 78 | Temp 97.5°F | Ht 62.0 in | Wt 185.8 lb

## 2019-12-15 DIAGNOSIS — K219 Gastro-esophageal reflux disease without esophagitis: Secondary | ICD-10-CM

## 2019-12-15 DIAGNOSIS — R1310 Dysphagia, unspecified: Secondary | ICD-10-CM | POA: Diagnosis not present

## 2019-12-15 DIAGNOSIS — R109 Unspecified abdominal pain: Secondary | ICD-10-CM

## 2019-12-15 DIAGNOSIS — R1319 Other dysphagia: Secondary | ICD-10-CM

## 2019-12-15 DIAGNOSIS — K5900 Constipation, unspecified: Secondary | ICD-10-CM

## 2019-12-15 MED ORDER — OMEPRAZOLE 40 MG PO CPDR: 40.0000 mg | DELAYED_RELEASE_CAPSULE | Freq: Two times a day (BID) | ORAL | 3 refills | Status: DC

## 2019-12-15 NOTE — Telephone Encounter (Signed)
Returning call and she is aware that the scheduler will be back on Monday. [phone]

## 2019-12-15 NOTE — Addendum Note (Signed)
Addended by: Gordy Levan, Lilygrace Rodick A on: 12/15/2019 11:30 AM   Modules accepted: Orders

## 2019-12-15 NOTE — Telephone Encounter (Signed)
LMOVM for pt to schedule EGD/+/-DIL w/ Dr. Gala Romney

## 2019-12-15 NOTE — Progress Notes (Signed)
Referring Provider: Etter Sjogren, FNP Primary Care Physician:  Etter Sjogren, FNP Primary GI:  Dr. Gala Romney  Chief Complaint  Patient presents with  . Constipation    'about pratically back to normal  . Abdominal Pain    none currently    HPI:   Carmen Morgan is a 64 y.o. female who presents for follow-up on constipation and abdominal pain.  The patient was last seen in our office 06/16/2019 for the same as well as rectal bleeding.  Colonoscopy up-to-date December 2020 with diverticulosis in the entire examined colon and otherwise normal.  Recommended repeat colonoscopy in 10 years.  At her last visit noted constipation since her colonoscopy with small hard stools consistent with Bristol 1.  Prior to hysterectomy in 2014 having Bristol 4 stools and after hysterectomy had diarrhea 3 times a week.  Has a bowel movement about 4 times a week with incomplete emptying.  No persisting gas or bloating, which was a previous complaint.  No other overt GI complaints.  Notes abdominal soreness since hysterectomy, likely scar tissue or adhesions.  However, some abdominal discomfort could be emanating from constipation.  Recommended Colace 100 mg daily and add MiraLAX 1-2 times a day if Colace not effective enough.  Cut back on medications if diarrhea begins.  Follow-up in 6 months.  Today she states doing okay overall. She had COVID-19 in January and was out of work for a month. Had to wait 90 days until getting COVID-19 vaccine which she did. Her husband ended up in the hospital but did recover. She feels like she has a more frequent cough and wonders if this is a lingering symptom. PCP did a CXR and it was normal. States her stools are essentially back to normal, no further constipation. Stools are bristol 4, has a bowel movement every other days. Denies any further abdominal pain. Thinks she's having some more indigestion with throat burning and is on Pepcid, uses OTC omeprazole prn. Having  intermittent solid food dysphagia and requires fluids to pass, rare regurgitation. Denies N/V, hematochezia, melena, fever, chills, unintentional weight loss. Denies URI or flu-like symptoms. Denies loss of sense of taste or smell. The patient has received COVID-19 vaccination(s). Denies chest pain, dyspnea, dizziness, lightheadedness, syncope, near syncope. Denies any other upper or lower GI symptoms.   Past Medical History:  Diagnosis Date  . GERD (gastroesophageal reflux disease)   . Hypertension   . Seasonal allergies     Past Surgical History:  Procedure Laterality Date  . ABDOMINAL HYSTERECTOMY  2014  . BACK SURGERY  2012  . COLONOSCOPY N/A 02/16/2019   Procedure: COLONOSCOPY;  Surgeon: Daneil Dolin, MD;  Location: [facility];  Service: Endoscopy;  Laterality: N/A;  2:00  . left breast cystectomy  1997  . VEIN SURGERY  2013    Current Outpatient Medications  Medication Sig Dispense Refill  . acetaminophen (TYLENOL) 500 MG tablet Take 1,000 mg by mouth every 6 (six) hours as needed for moderate pain or headache.    Marland Kitchen aspirin EC 81 MG tablet Take 81 mg by mouth daily.    . Cholecalciferol (VITAMIN D3) 50 MCG (2000 UT) TABS Take 2,000 Units by mouth daily.     Marland Kitchen estradiol (ESTRACE) 0.5 MG tablet Take 0.5 mg by mouth at bedtime.     . famotidine (PEPCID) 20 MG tablet Take 20 mg by mouth daily as needed for heartburn or indigestion.    . fexofenadine (ALLEGRA) 180 MG tablet Take  180 mg by mouth daily.    . fluticasone (FLONASE) 50 MCG/ACT nasal spray Place 1 spray into both nostrils daily.    Marland Kitchen ibuprofen (ADVIL) 200 MG tablet Take 600-800 mg by mouth every 8 (eight) hours as needed for headache or moderate pain.    Marland Kitchen lisinopril-hydrochlorothiazide (ZESTORETIC) 20-25 MG tablet Take 1 tablet by mouth daily.    . Melatonin 10 MG CAPS Take 20 mg by mouth at bedtime.    Marland Kitchen OVER THE COUNTER MEDICATION Take 1 capsule by mouth daily. Vari-Gone otc supplement    . Potassium Gluconate 550  (90 K) MG TABS Take 550 mg by mouth daily.    . solifenacin (VESICARE) 10 MG tablet Take 10 mg by mouth daily.      No current facility-administered medications for this visit.    Allergies as of 12/15/2019 - Review Complete 12/15/2019  Allergen Reaction Noted  . Penicillins Hives 10/29/2018  . Vitamin b12 Nausea And Vomiting 10/29/2018  . Codeine Palpitations 10/29/2018    Family History  Problem Relation Age of Onset  . Colon cancer Neg Hx     Social History   Socioeconomic History  . Marital status: Married    Spouse name: Not on file  . Number of children: Not on file  . Years of education: Not on file  . Highest education level: Not on file  Occupational History  . Not on file  Tobacco Use  . Smoking status: Former Research scientist (life sciences)  . Smokeless tobacco: Never Used  Vaping Use  . Vaping Use: Former  Substance and Sexual Activity  . Alcohol use: Never  . Drug use: Never  . Sexual activity: Not on file  Other Topics Concern  . Not on file  Social History Narrative  . Not on file   Social Determinants of Health   Financial Resource Strain:   . Difficulty of Paying Living Expenses: Not on file  Food Insecurity:   . Worried About Charity fundraiser in the Last Year: Not on file  . Ran Out of Food in the Last Year: Not on file  Transportation Needs:   . Lack of Transportation (Medical): Not on file  . Lack of Transportation (Non-Medical): Not on file  Physical Activity:   . Days of Exercise per Week: Not on file  . Minutes of Exercise per Session: Not on file  Stress:   . Feeling of Stress : Not on file  Social Connections:   . Frequency of Communication with Friends and Family: Not on file  . Frequency of Social Gatherings with Friends and Family: Not on file  . Attends Religious Services: Not on file  . Active Member of Clubs or Organizations: Not on file  . Attends Archivist Meetings: Not on file  . Marital Status: Not on file     Subjective: Review of Systems  Constitutional: Negative for chills, fever, malaise/fatigue and weight loss.  HENT: Negative for congestion and sore throat.   Respiratory: Negative for cough and shortness of breath.   Cardiovascular: Negative for chest pain and palpitations.  Gastrointestinal: Positive for heartburn. Negative for abdominal pain, blood in stool, constipation, diarrhea, melena, nausea and vomiting.       Dysphagia  Musculoskeletal: Negative for joint pain and myalgias.  Skin: Negative for rash.  Neurological: Negative for dizziness and weakness.  Endo/Heme/Allergies: Does not bruise/bleed easily.  Psychiatric/Behavioral: Negative for depression. The patient is not nervous/anxious.   All other systems reviewed and are negative.  Objective: BP (!) 145/73   Pulse 78   Temp (!) 97.5 F (36.4 C) (Oral)   Ht 5\' 2"  (1.575 m)   Wt 185 lb 12.8 oz (84.3 kg)   BMI 33.98 kg/m  Physical Exam Vitals and nursing note reviewed.  Constitutional:      General: She is not in acute distress.    Appearance: Normal appearance. She is well-developed. She is obese. She is not ill-appearing, toxic-appearing or diaphoretic.  HENT:     Head: Normocephalic and atraumatic.     Nose: No congestion or rhinorrhea.  Eyes:     General: No scleral icterus. Cardiovascular:     Rate and Rhythm: Normal rate and regular rhythm.     Heart sounds: Normal heart sounds.  Pulmonary:     Effort: Pulmonary effort is normal. No respiratory distress.     Breath sounds: Normal breath sounds.  Abdominal:     General: Bowel sounds are normal.     Palpations: Abdomen is soft. There is no hepatomegaly, splenomegaly or mass.     Tenderness: There is no abdominal tenderness. There is no guarding or rebound.     Hernia: No hernia is present.  Skin:    General: Skin is warm and dry.     Coloration: Skin is not jaundiced.     Findings: No rash.  Neurological:     General: No focal deficit present.      Mental Status: She is alert and oriented to person, place, and time.  Psychiatric:        Attention and Perception: Attention normal.        Mood and Affect: Mood normal.        Speech: Speech normal.        Behavior: Behavior normal.        Thought Content: Thought content normal.        Cognition and Memory: Cognition and memory normal.      Assessment:  Pleasant 64 year old female who presents for follow-up on constipation and abdominal pain.  Those symptoms have resolved.  However, she has had new onset/worsening GERD and dysphagia symptoms.  She does have a history of intermittent GERD and takes Pepcid as well as over-the-counter Prilosec as needed.  GERD: Several days of worsening of GERD symptoms with burning up into her throat, bitter sour taste.  I will have her stop Pepcid and will start her on a PPI twice daily try to manage symptoms until we can get her prescription.  Dysphagia: Solid food dysphagia intermittently, not every meal.  No regurgitation.  Likely due to longstanding chronic GERD but cannot rule out stricture, web, ring.  We will set her up for an upper endoscopy with possible dilation to further evaluate and treat her symptoms  Proceed with EGD with Dr. Gala Romney in near future: the risks, benefits, and alternatives have been discussed with the patient in detail. The patient states understanding and desires to proceed.  ASA II   Plan: 1. Stop Pepcid 2. Prescription for Prilosec 40 mg twice daily to the pharmacy 3. EGD with possible dilation 4. Follow-up in 3 months    Thank you for allowing [place] to participate in the care of [place], DNP, AGNP-C Adult & Gerontological Nurse Practitioner [facility]   12/15/2019 11:14 AM   Disclaimer: This note was dictated with voice recognition software. Similar sounding words can inadvertently be transcribed and may not be corrected upon review.

## 2019-12-15 NOTE — Patient Instructions (Signed)
Your health issues we discussed today were:   GERD (reflux/heartburn) with dysphagia (swallowing difficulties): 1. Stop taking Pepcid and "occasional Prilosec over-the-counter" 2. I sent a prescription for Prilosec 40 mg to your pharmacy.  Take this twice a day, pursing in the morning on an empty stomach and 30 minutes for your last meal the day 3. We will schedule you for an upper endoscopy with possible dilation with Dr. Gala Romney to further evaluate 4. Further recommendations will follow your endoscopy 5. Call us if you have any worsening or severe symptoms 6. If food gets stuck when eating and will not go forward or regurgitate back up for more than 2 hours proceed to the emergency room  Constipation and abdominal pain: 1. I am glad your symptoms are doing better! 2. Let us know if you have any worsening or recurrent symptoms  Overall I recommend:  1. Continue your other current medications 2. Return for follow-up in 3 months 3. Call us for any questions or concerns   ---------------------------------------------------------------  I am glad you have gotten your COVID-19 vaccination!  Even though you are fully vaccinated you should continue to follow CDC and state/local guidelines.  ---------------------------------------------------------------   At [facility] we value your feedback. You may receive a survey about your visit today. Please share your experience as we strive to create trusting relationships with our patients to provide genuine, compassionate, quality care.  We appreciate your understanding and patience as we review any laboratory studies, imaging, and other diagnostic tests that are ordered as we care for you. Our office policy is 5 business days for review of these results, and any emergent or urgent results are addressed in a timely manner for your best interest. If you do not hear from our office in 1 week, please contact us.   We also encourage the  use of MyChart, which contains your medical information for your review as well. If you are not enrolled in this feature, an access code is on this after visit summary for your convenience. Thank you for allowing [place] to be involved in your care.  It was great to see you today!  I hope you have a great Fall!!

## 2019-12-19 NOTE — Telephone Encounter (Signed)
Patient returned call. She has been scheduled for procedure 11/3 at 2:45pm. Aware will mail instructions with covid test appt. Confirmed mailing address.

## 2019-12-29 ENCOUNTER — Encounter: Payer: Self-pay | Admitting: Nurse Practitioner

## 2020-01-17 ENCOUNTER — Telehealth: Payer: Self-pay | Admitting: *Deleted

## 2020-01-17 ENCOUNTER — Other Ambulatory Visit (HOSPITAL_COMMUNITY)
Admission: RE | Admit: 2020-01-17 | Discharge: 2020-01-17 | Disposition: A | Discharge status: Home / Self Care | Payer: Medicare Other | Source: Ambulatory Visit | Attending: Internal Medicine | Admitting: Internal Medicine

## 2020-01-17 ENCOUNTER — Other Ambulatory Visit: Payer: Self-pay

## 2020-01-17 DIAGNOSIS — Z20822 Contact with and (suspected) exposure to covid-19: Secondary | ICD-10-CM | POA: Insufficient documentation

## 2020-01-17 DIAGNOSIS — Z01812 Encounter for preprocedural laboratory examination: Secondary | ICD-10-CM | POA: Diagnosis present

## 2020-01-17 LAB — SARS CORONAVIRUS 2 (TAT 6-24 HRS): SARS Coronavirus 2: NEGATIVE

## 2020-01-17 NOTE — Telephone Encounter (Signed)
LMOVM for pt to see if she can move procedure time up tomorrow

## 2020-01-18 ENCOUNTER — Encounter (HOSPITAL_COMMUNITY): Payer: Self-pay | Admitting: Internal Medicine

## 2020-01-18 ENCOUNTER — Encounter (HOSPITAL_COMMUNITY)
Admission: RE | Disposition: A | Discharge status: Home / Self Care | Payer: Self-pay | Source: Home / Self Care | Attending: Internal Medicine

## 2020-01-18 ENCOUNTER — Ambulatory Visit (HOSPITAL_COMMUNITY)
Admission: RE | Admit: 2020-01-18 | Discharge: 2020-01-18 | Disposition: A | Discharge status: Home / Self Care | Payer: Medicare Other | Attending: Internal Medicine | Admitting: Internal Medicine

## 2020-01-18 ENCOUNTER — Other Ambulatory Visit: Payer: Self-pay

## 2020-01-18 ENCOUNTER — Encounter: Payer: Self-pay | Admitting: Internal Medicine

## 2020-01-18 DIAGNOSIS — Z87891 Personal history of nicotine dependence: Secondary | ICD-10-CM | POA: Insufficient documentation

## 2020-01-18 DIAGNOSIS — R1310 Dysphagia, unspecified: Secondary | ICD-10-CM | POA: Diagnosis not present

## 2020-01-18 DIAGNOSIS — K219 Gastro-esophageal reflux disease without esophagitis: Secondary | ICD-10-CM | POA: Diagnosis not present

## 2020-01-18 DIAGNOSIS — R1314 Dysphagia, pharyngoesophageal phase: Secondary | ICD-10-CM | POA: Diagnosis not present

## 2020-01-18 DIAGNOSIS — J302 Other seasonal allergic rhinitis: Secondary | ICD-10-CM | POA: Diagnosis not present

## 2020-01-18 DIAGNOSIS — K449 Diaphragmatic hernia without obstruction or gangrene: Secondary | ICD-10-CM | POA: Diagnosis not present

## 2020-01-18 HISTORY — PX: MALONEY DILATION: SHX5535

## 2020-01-18 HISTORY — PX: ESOPHAGOGASTRODUODENOSCOPY: SHX5428

## 2020-01-18 SURGERY — EGD (ESOPHAGOGASTRODUODENOSCOPY)
Anesthesia: Moderate Sedation

## 2020-01-18 MED ORDER — MIDAZOLAM HCL 5 MG/5ML IJ SOLN
INTRAMUSCULAR | Status: AC
  Filled 2020-01-18: qty 10

## 2020-01-18 MED ORDER — STERILE WATER FOR IRRIGATION IR SOLN
Status: DC | PRN
  Administered 2020-01-18: 1.5 mL

## 2020-01-18 MED ORDER — SODIUM CHLORIDE 0.9 % IV SOLN
INTRAVENOUS | Status: DC
  Administered 2020-01-18: 1000 mL via INTRAVENOUS

## 2020-01-18 MED ORDER — ONDANSETRON HCL 4 MG/2ML IJ SOLN
INTRAMUSCULAR | Status: AC
  Filled 2020-01-18: qty 2

## 2020-01-18 MED ORDER — LIDOCAINE VISCOUS HCL 2 % MT SOLN
OROMUCOSAL | Status: DC | PRN
  Administered 2020-01-18: 5 mL via OROMUCOSAL

## 2020-01-18 MED ORDER — MEPERIDINE HCL 100 MG/ML IJ SOLN
INTRAMUSCULAR | Status: DC | PRN
  Administered 2020-01-18: 25 mg via INTRAVENOUS
  Administered 2020-01-18: 15 mg via INTRAVENOUS

## 2020-01-18 MED ORDER — MIDAZOLAM HCL 5 MG/5ML IJ SOLN
INTRAMUSCULAR | Status: DC | PRN
  Administered 2020-01-18 (×2): 1 mg via INTRAVENOUS
  Administered 2020-01-18: 2 mg via INTRAVENOUS
  Administered 2020-01-18: 1 mg via INTRAVENOUS
  Administered 2020-01-18: 2 mg via INTRAVENOUS

## 2020-01-18 MED ORDER — LIDOCAINE VISCOUS HCL 2 % MT SOLN
OROMUCOSAL | Status: AC
  Filled 2020-01-18: qty 15

## 2020-01-18 MED ORDER — MEPERIDINE HCL 50 MG/ML IJ SOLN
INTRAMUSCULAR | Status: AC
  Filled 2020-01-18: qty 1

## 2020-01-18 MED ORDER — ONDANSETRON HCL 4 MG/2ML IJ SOLN
INTRAMUSCULAR | Status: DC | PRN
  Administered 2020-01-18: 4 mg via INTRAVENOUS

## 2020-01-18 NOTE — Discharge Instructions (Signed)
EGD Discharge instructions Please read the instructions outlined below and refer to this sheet in the next few weeks. These discharge instructions provide you with general information on caring for yourself after you leave the hospital. Your doctor may also give you specific instructions. While your treatment has been planned according to the most current medical practices available, unavoidable complications occasionally occur. If you have any problems or questions after discharge, please call your doctor. ACTIVITY  You may resume your regular activity but move at a slower pace for the next 24 hours.   Take frequent rest periods for the next 24 hours.   Walking will help expel (get rid of) the air and reduce the bloated feeling in your abdomen.   No driving for 24 hours (because of the anesthesia (medicine) used during the test).   You may shower.   Do not sign any important legal documents or operate any machinery for 24 hours (because of the anesthesia used during the test).  NUTRITION  Drink plenty of fluids.   You may resume your normal diet.   Begin with a light meal and progress to your normal diet.   Avoid alcoholic beverages for 24 hours or as instructed by your caregiver.  MEDICATIONS  You may resume your normal medications unless your caregiver tells you otherwise.  WHAT YOU CAN EXPECT TODAY  You may experience abdominal discomfort such as a feeling of fullness or "gas" pains.  FOLLOW-UP  Your doctor will discuss the results of your test with you.  SEEK IMMEDIATE MEDICAL ATTENTION IF ANY OF THE FOLLOWING OCCUR:  Excessive nausea (feeling sick to your stomach) and/or vomiting.   Severe abdominal pain and distention (swelling).   Trouble swallowing.   Temperature over 101 F (37.8 C).   Rectal bleeding or vomiting of blood.    You have a small hiatal hernia.  I stretched your esophagus today  Stop omeprazole/Prilosec; begin Protonix 40 mg twice  daily-before breakfast and supper  GERD information provided  Had a goal to lose 10 pounds in the next 3 months  Keep upcoming office visit with Walden Field  At patient request I called Skip Mayer at [phone] -left message on answering service with the results and recommendations   Gastroesophageal Reflux Disease, Adult Gastroesophageal reflux (GER) happens when acid from the stomach flows up into the tube that connects the mouth and the stomach (esophagus). Normally, food travels down the esophagus and stays in the stomach to be digested. However, when a person has GER, food and stomach acid sometimes move back up into the esophagus. If this becomes a more serious problem, the person may be diagnosed with a disease called gastroesophageal reflux disease (GERD). GERD occurs when the reflux:  Happens often.  Causes frequent or severe symptoms.  Causes problems such as damage to the esophagus. When stomach acid comes in contact with the esophagus, the acid may cause soreness (inflammation) in the esophagus. Over time, GERD may create small holes (ulcers) in the lining of the esophagus. What are the causes? This condition is caused by a problem with the muscle between the esophagus and the stomach (lower esophageal sphincter, or LES). Normally, the LES muscle closes after food passes through the esophagus to the stomach. When the LES is weakened or abnormal, it does not close properly, and that allows food and stomach acid to go back up into the esophagus. The LES can be weakened by certain dietary substances, medicines, and medical conditions, including:  Tobacco use.  Pregnancy.  Having a hiatal hernia.  Alcohol use.  Certain foods and beverages, such as coffee, chocolate, onions, and peppermint. What increases the risk? You are more likely to develop this condition if you:  Have an increased body weight.  Have a connective tissue disorder.  Use NSAID medicines. What are  the signs or symptoms? Symptoms of this condition include:  Heartburn.  Difficult or painful swallowing.  The feeling of having a lump in the throat.  Abitter taste in the mouth.  Bad breath.  Having a large amount of saliva.  Having an upset or bloated stomach.  Belching.  Chest pain. Different conditions can cause chest pain. Make sure you see your health care provider if you experience chest pain.  Shortness of breath or wheezing.  Ongoing (chronic) cough or a night-time cough.  Wearing away of tooth enamel.  Weight loss. How is this diagnosed? Your health care provider will take a medical history and perform a physical exam. To determine if you have mild or severe GERD, your health care provider may also monitor how you respond to treatment. You may also have tests, including:  A test to examine your stomach and esophagus with a small camera (endoscopy).  A test thatmeasures the acidity level in your esophagus.  A test thatmeasures how much pressure is on your esophagus.  A barium swallow or modified barium swallow test to show the shape, size, and functioning of your esophagus. How is this treated? The goal of treatment is to help relieve your symptoms and to prevent complications. Treatment for this condition may vary depending on how severe your symptoms are. Your health care provider may recommend:  Changes to your diet.  Medicine.  Surgery. Follow these instructions at home: Eating and drinking   Follow a diet as recommended by your health care provider. This may involve avoiding foods and drinks such as: ? Coffee and tea (with or without caffeine). ? Drinks that containalcohol. ? Energy drinks and sports drinks. ? Carbonated drinks or sodas. ? Chocolate and cocoa. ? Peppermint and mint flavorings. ? Garlic and onions. ? Horseradish. ? Spicy and acidic foods, including peppers, chili powder, curry powder, vinegar, hot sauces, and barbecue  sauce. ? Citrus fruit juices and citrus fruits, such as oranges, lemons, and limes. ? Tomato-based foods, such as red sauce, chili, salsa, and pizza with red sauce. ? Fried and fatty foods, such as donuts, french fries, potato chips, and high-fat dressings. ? High-fat meats, such as hot dogs and fatty cuts of red and white meats, such as rib eye steak, sausage, ham, and bacon. ? High-fat dairy items, such as whole milk, butter, and cream cheese.  Eat small, frequent meals instead of large meals.  Avoid drinking large amounts of liquid with your meals.  Avoid eating meals during the 2-3 hours before bedtime.  Avoid lying down right after you eat.  Do not exercise right after you eat. Lifestyle   Do not use any products that contain nicotine or tobacco, such as cigarettes, e-cigarettes, and chewing tobacco. If you need help quitting, ask your health care provider.  Try to reduce your stress by using methods such as yoga or meditation. If you need help reducing stress, ask your health care provider.  If you are overweight, reduce your weight to an amount that is healthy for you. Ask your health care provider for guidance about a safe weight loss goal. General instructions  Pay attention to any changes in your symptoms.  Take  over-the-counter and prescription medicines only as told by your health care provider. Do not take aspirin, ibuprofen, or other NSAIDs unless your health care provider told you to do so.  Wear loose-fitting clothing. Do not wear anything tight around your waist that causes pressure on your abdomen.  Raise (elevate) the head of your bed about 6 inches (15 cm).  Avoid bending over if this makes your symptoms worse.  Keep all follow-up visits as told by your health care provider. This is important. Contact a health care provider if:  You have: ? New symptoms. ? Unexplained weight loss. ? Difficulty swallowing or it hurts to swallow. ? Wheezing or a persistent  cough. ? A hoarse voice.  Your symptoms do not improve with treatment. Get help right away if you:  Have pain in your arms, neck, jaw, teeth, or back.  Feel sweaty, dizzy, or light-headed.  Have chest pain or shortness of breath.  Vomit and your vomit looks like blood or coffee grounds.  Faint.  Have stool that is bloody or black.  Cannot swallow, drink, or eat. Summary  Gastroesophageal reflux happens when acid from the stomach flows up into the esophagus. GERD is a disease in which the reflux happens often, causes frequent or severe symptoms, or causes problems such as damage to the esophagus.  Treatment for this condition may vary depending on how severe your symptoms are. Your health care provider may recommend diet and lifestyle changes, medicine, or surgery.  Contact a health care provider if you have new or worsening symptoms.  Take over-the-counter and prescription medicines only as told by your health care provider. Do not take aspirin, ibuprofen, or other NSAIDs unless your health care provider told you to do so.  Keep all follow-up visits as told by your health care provider. This is important. This information is not intended to replace advice given to you by your health care provider. Make sure you discuss any questions you have with your health care provider. Document Revised: 09/09/2017 Document Reviewed: 09/09/2017 Elsevier Patient Education  [place].  Hiatal Hernia  A hiatal hernia occurs when part of the stomach slides above the muscle that separates the abdomen from the chest (diaphragm). A person can be born with a hiatal hernia (congenital), or it may develop over time. In almost all cases of hiatal hernia, only the top part of the stomach pushes through the diaphragm. Many people have a hiatal hernia with no symptoms. The larger the hernia, the more likely it is that you will have symptoms. In some cases, a hiatal hernia allows stomach acid to  flow back into the tube that carries food from your mouth to your stomach (esophagus). This may cause heartburn symptoms. Severe heartburn symptoms may mean that you have developed a condition called gastroesophageal reflux disease (GERD). What are the causes? This condition is caused by a weakness in the opening (hiatus) where the esophagus passes through the diaphragm to attach to the upper part of the stomach. A person may be born with a weakness in the hiatus, or a weakness can develop over time. What increases the risk? This condition is more likely to develop in:  Older people. Age is a major risk factor for a hiatal hernia, especially if you are over the age of 80.  Pregnant women.  People who are overweight.  People who have frequent constipation. What are the signs or symptoms? Symptoms of this condition usually develop in the form of GERD symptoms. Symptoms  include:  Heartburn.  Belching.  Indigestion.  Trouble swallowing.  Coughing or wheezing.  Sore throat.  Hoarseness.  Chest pain.  Nausea and vomiting. How is this diagnosed? This condition may be diagnosed during testing for GERD. Tests that may be done include:  X-rays of your stomach or chest.  An upper gastrointestinal (GI) series. This is an X-ray exam of your GI tract that is taken after you swallow a chalky liquid that shows up clearly on the X-ray.  Endoscopy. This is a procedure to look into your stomach using a thin, flexible tube that has a tiny camera and light on the end of it. How is this treated? This condition may be treated by:  Dietary and lifestyle changes to help reduce GERD symptoms.  Medicines. These may include: ? Over-the-counter antacids. ? Medicines that make your stomach empty more quickly. ? Medicines that block the production of stomach acid (H2 blockers). ? Stronger medicines to reduce stomach acid (proton pump inhibitors).  Surgery to repair the hernia, if other  treatments are not helping. If you have no symptoms, you may not need treatment. Follow these instructions at home: Lifestyle and activity  Do not use any products that contain nicotine or tobacco, such as cigarettes and e-cigarettes. If you need help quitting, ask your health care provider.  Try to achieve and maintain a healthy body weight.  Avoid putting pressure on your abdomen. Anything that puts pressure on your abdomen increases the amount of acid that may be pushed up into your esophagus. ? Avoid bending over, especially after eating. ? Raise the head of your bed by putting blocks under the legs. This keeps your head and esophagus higher than your stomach. ? Do not wear tight clothing around your chest or stomach. ? Try not to strain when having a bowel movement, when urinating, or when lifting heavy objects. Eating and drinking  Avoid foods that can worsen GERD symptoms. These may include: ? Fatty foods, like fried foods. ? Citrus fruits, like oranges or lemon. ? Other foods and drinks that contain acid, like orange juice or tomatoes. ? Spicy food. ? Chocolate.  Eat frequent small meals instead of three large meals a day. This helps prevent your stomach from getting too full. ? Eat slowly. ? Do not lie down right after eating. ? Do not eat 1-2 hours before bed.  Do not drink beverages with caffeine. These include cola, coffee, cocoa, and tea.  Do not drink alcohol. General instructions  Take over-the-counter and prescription medicines only as told by your health care provider.  Keep all follow-up visits as told by your health care provider. This is important. Contact a health care provider if:  Your symptoms are not controlled with medicines or lifestyle changes.  You are having trouble swallowing.  You have coughing or wheezing that will not go away. Get help right away if:  Your pain is getting worse.  Your pain spreads to your arms, neck, jaw, teeth, or  back.  You have shortness of breath.  You sweat for no reason.  You feel sick to your stomach (nauseous) or you vomit.  You vomit blood.  You have bright red blood in your stools.  You have black, tarry stools. This information is not intended to replace advice given to you by your health care provider. Make sure you discuss any questions you have with your health care provider. Document Revised: 02/13/2017 Document Reviewed: 10/06/2016 Elsevier Patient Education  [place].

## 2020-01-18 NOTE — H&P (Signed)
@LOGO @   Primary Care Physician:  Etter Sjogren, FNP Primary Gastroenterologist:  Dr. Gala Romney  Pre-Procedure History & Physical: HPI:  Carmen Morgan is a 64 y.o. female here for further evaluation of worsening GERD and esophageal dysphagia.  States omeprazole 40 mg twice daily is help with her reflux.  Wakes up with mucus in the back of her throat quite a bit.  Has quite a bit of allergies and takes multiple agents for sinus issues.  Notes 40 to 50 pound weight gain in the past 5 years. Patient has been taking meloxicam.  Past Medical History:  Diagnosis Date  . GERD (gastroesophageal reflux disease)   . Hypertension   . Seasonal allergies     Past Surgical History:  Procedure Laterality Date  . ABDOMINAL HYSTERECTOMY  2014  . BACK SURGERY  2012  . COLONOSCOPY N/A 02/16/2019   Procedure: COLONOSCOPY;  Surgeon: Daneil Dolin, MD;  Location: [facility];  Service: Endoscopy;  Laterality: N/A;  2:00  . left breast cystectomy  1997  . VEIN SURGERY  2013    Prior to Admission medications   Medication Sig Start Date End Date Taking? Authorizing Provider  acetaminophen (TYLENOL) 500 MG tablet Take 1,000 mg by mouth every 6 (six) hours as needed for moderate pain or headache.   Yes [provider]  aspirin EC 81 MG tablet Take 81 mg by mouth daily.   Yes [provider]  Cholecalciferol (VITAMIN D3) 50 MCG (2000 UT) TABS Take 2,000 Units by mouth daily.    Yes [provider]  cyclobenzaprine (FLEXERIL) 10 MG tablet Take 10 mg by mouth 3 (three) times daily as needed for muscle spasms.   Yes [provider]  estradiol (ESTRACE) 0.5 MG tablet Take 0.5 mg by mouth at bedtime.    Yes [provider]  fexofenadine (ALLEGRA) 180 MG tablet Take 180 mg by mouth daily.   Yes [provider]  fluticasone (FLONASE) 50 MCG/ACT nasal spray Place 2 sprays into both nostrils daily.    Yes [provider]  lisinopril-hydrochlorothiazide  (ZESTORETIC) 20-25 MG tablet Take 1 tablet by mouth daily.   Yes [provider]  Melatonin 10 MG CAPS Take 10 mg by mouth at bedtime.    Yes [provider]  meloxicam (MOBIC) 15 MG tablet Take 15 mg by mouth daily as needed for pain.  11/27/19  Yes [provider]  omeprazole (PRILOSEC) 40 MG capsule Take 1 capsule (40 mg total) by mouth in the morning and at bedtime. 12/15/19  Yes Carlis Stable, NP  OVER THE COUNTER MEDICATION Take 1 capsule by mouth daily. Vari-Gone otc supplement   Yes [provider]  Potassium Gluconate 550 (90 K) MG TABS Take 550 mg by mouth daily.   Yes [provider]  solifenacin (VESICARE) 10 MG tablet Take 10 mg by mouth daily.    Yes [provider]  traMADol (ULTRAM) 50 MG tablet Take 50 mg by mouth 2 (two) times daily as needed for pain. 01/06/20  Yes [provider]  ketoconazole (NIZORAL) 2 % shampoo Apply 1 application topically daily as needed for psychosis. 01/03/20   [provider]    Allergies as of 12/19/2019 - Review Complete 12/15/2019  Allergen Reaction Noted  . Penicillins Hives 10/29/2018  . Vitamin b12 Nausea And Vomiting 10/29/2018  . Codeine Palpitations 10/29/2018    Family History  Problem Relation Age of Onset  . Colon cancer Neg Hx  Social History   Socioeconomic History  . Marital status: Married    Spouse name: Not on file  . Number of children: Not on file  . Years of education: Not on file  . Highest education level: Not on file  Occupational History  . Not on file  Tobacco Use  . Smoking status: Former Research scientist (life sciences)  . Smokeless tobacco: Never Used  Vaping Use  . Vaping Use: Former  Substance and Sexual Activity  . Alcohol use: Never  . Drug use: Never  . Sexual activity: Not on file  Other Topics Concern  . Not on file  Social History Narrative  . Not on file   Social Determinants of Health   Financial Resource Strain:   . Difficulty of Paying  Living Expenses: Not on file  Food Insecurity:   . Worried About Charity fundraiser in the Last Year: Not on file  . Ran Out of Food in the Last Year: Not on file  Transportation Needs:   . Lack of Transportation (Medical): Not on file  . Lack of Transportation (Non-Medical): Not on file  Physical Activity:   . Days of Exercise per Week: Not on file  . Minutes of Exercise per Session: Not on file  Stress:   . Feeling of Stress : Not on file  Social Connections:   . Frequency of Communication with Friends and Family: Not on file  . Frequency of Social Gatherings with Friends and Family: Not on file  . Attends Religious Services: Not on file  . Active Member of Clubs or Organizations: Not on file  . Attends Archivist Meetings: Not on file  . Marital Status: Not on file  Intimate Partner Violence:   . Fear of Current or Ex-Partner: Not on file  . Emotionally Abused: Not on file  . Physically Abused: Not on file  . Sexually Abused: Not on file    Review of Systems: See HPI, otherwise negative ROS  Physical Exam: BP (!) 116/56   Pulse 99   Temp 98.2 F (36.8 C) (Oral)   Resp 16   Ht 5\' 1"  (1.549 m)   Wt 81.6 kg   SpO2 97%   BMI 34.01 kg/m  General:   Alert,  Well-developed, well-nourished, pleasant and cooperative in NAD Neck:  Supple; no masses or thyromegaly. No significant cervical adenopathy. Lungs:  Clear throughout to auscultation.   No wheezes, crackles, or rhonchi. No acute distress. Heart:  Regular rate and rhythm; no murmurs, clicks, rubs,  or gallops. Abdomen: Non-distended, normal bowel sounds.  Soft and nontender without appreciable mass or hepatosplenomegaly.  Pulses:  Normal pulses noted. Extremities:  Without clubbing or edema.  Impression/Plan: 64 year old lady with recent flare of GERD now esophageal dysphagia.  Some improvement with omeprazole 40 mg twice daily.  EGD now being done to further evaluate.  I have offered the patient a  diagnostic EGD with esophageal dilation as feasible/appropriate per plan. The risks, benefits, limitations, alternatives and imponderables have been reviewed with the patient. Potential for esophageal dilation, biopsy, etc. have also been reviewed.  Questions have been answered. All parties agreeable.     Notice: This dictation was prepared with Dragon dictation along with smaller phrase technology. Any transcriptional errors that result from this process are unintentional and may not be corrected upon review.

## 2020-01-18 NOTE — Op Note (Signed)
[facility] Patient Name: Carmen Morgan Procedure Date: 01/18/2020 1:47 PM MRN: [id number] Date of Birth: December 30, 1955 Attending MD: Norvel Richards , MD CSN: [id number] Age: 64 Admit Type: Outpatient Procedure:                Upper GI endoscopy Indications:              Dysphagia Providers:                Norvel Richards, MD, Jeanann Lewandowsky. Sharon Seller, RN,                            Raphael Gibney, Technician Referring MD:              Medicines:                Midazolam 7 mg IV, Meperidine 40 mg IV Complications:            No immediate complications. Estimated Blood Loss:     Estimated blood loss: none. Estimated blood loss:                            none. Procedure:                Pre-Anesthesia Assessment:                           - Prior to the procedure, a History and Physical                            was performed, and patient medications and                            allergies were reviewed. The patient's tolerance of                            previous anesthesia was also reviewed. The risks                            and benefits of the procedure and the sedation                            options and risks were discussed with the patient.                            All questions were answered, and informed consent                            was obtained. Prior Anticoagulants: The patient has                            taken no previous anticoagulant or antiplatelet                            agents. ASA Grade Assessment: II - A patient with  mild systemic disease. After reviewing the risks                            and benefits, the patient was deemed in                            satisfactory condition to undergo the procedure.                           After obtaining informed consent, the endoscope was                            passed under direct vision. Throughout the                            procedure, the patient's blood  pressure, pulse, and                            oxygen saturations were monitored continuously. The                            [id number]-H190 ([id number]) scope was introduced through the                            mouth, and advanced to the second part of duodenum.                            The upper GI endoscopy was accomplished without                            difficulty. The patient tolerated the procedure                            well. Scope In: 2:35:09 PM Scope Out: 2:43:39 PM Total Procedure Duration: 0 hours 8 minutes 30 seconds  Findings:      The examined esophagus was normal.      A small hiatal hernia was present.      The duodenal bulb and second portion of the duodenum were normal. The       scope was withdrawn. Dilation was performed with a Maloney dilator with       no resistance at 52 Fr. The scope was withdrawn. Dilation was performed       with a Maloney dilator with moderate resistance at 56 Fr. The dilation       site was examined following endoscope reinsertion and showed no change.       Estimated blood loss: none. Impression:               - Normal esophagus. Status post Maloney dilation                           - Small hiatal hernia.                           - Normal duodenal bulb and second portion of the  duodenum.                           - No specimens collected. Some of patient's upper                            GI symptoms may be ENT in origin. Moderate Sedation:      Moderate (conscious) sedation was administered by the endoscopy nurse       and supervised by the endoscopist. The following parameters were       monitored: oxygen saturation, heart rate, blood pressure, respiratory       rate, EKG, adequacy of pulmonary ventilation, and response to care.       Total physician intraservice time was 18 minutes. Recommendation:           - Patient has a contact number available for                            emergencies. The signs  and symptoms of potential                            delayed complications were discussed with the                            patient. Return to normal activities tomorrow.                            Written discharge instructions were provided to the                            patient.                           - Advance diet as tolerated.                           - Continue present medications; Stop omeprazole;                            begin Protonix 40 mg twice daily; return to my                            office in 2 months. Goal of a 10 pound weight loss                            in the next 3 months. Procedure Code(s):        --- Professional ---                           [phone], Esophagogastroduodenoscopy, flexible,                            transoral; diagnostic, including collection of                            specimen(s) by brushing or washing, when  performed                            (separate procedure)                           43450, Dilation of esophagus, by unguided sound or                            bougie, single or multiple passes                           G0500, Moderate sedation services provided by the                            same physician or other qualified health care                            professional performing a gastrointestinal                            endoscopic service that sedation supports,                            requiring the presence of an independent trained                            observer to assist in the monitoring of the                            patient's level of consciousness and physiological                            status; initial 15 minutes of intra-service time;                            patient age 75 years or older (additional time may                            be reported with [phone], as appropriate) Diagnosis Code(s):        --- Professional ---                           K44.9, Diaphragmatic hernia without  obstruction or                            gangrene                           R13.10, Dysphagia, unspecified CPT copyright 2019 [employer]. All rights reserved. The codes documented in this report are preliminary and upon coder review may  be revised to meet current compliance requirements. Cristopher Estimable. Brieanne Mignone, MD Norvel Richards, MD 01/18/2020 3:03:08 PM This report has been signed electronically. Number of Addenda: 0

## 2020-01-24 ENCOUNTER — Encounter (HOSPITAL_COMMUNITY): Payer: Self-pay | Admitting: Internal Medicine

## 2020-03-14 ENCOUNTER — Ambulatory Visit: Payer: Medicare Other | Admitting: Nurse Practitioner

## 2020-03-15 ENCOUNTER — Ambulatory Visit: Payer: Medicare Other | Admitting: Nurse Practitioner

## 2020-07-06 ENCOUNTER — Other Ambulatory Visit: Payer: Self-pay | Admitting: Nurse Practitioner

## 2020-07-15 HISTORY — PX: URETHRAL SLING: SHX2621

## 2020-08-05 ENCOUNTER — Other Ambulatory Visit: Payer: Self-pay | Admitting: Internal Medicine

## 2020-08-07 NOTE — Telephone Encounter (Signed)
Approved refill but needs office visit.

## 2020-08-24 NOTE — Telephone Encounter (Signed)
OV MADE °

## 2020-10-07 ENCOUNTER — Other Ambulatory Visit: Payer: Self-pay | Admitting: Gastroenterology

## 2020-10-13 ENCOUNTER — Inpatient Hospital Stay (HOSPITAL_COMMUNITY)
Admission: EM | Admit: 2020-10-13 | Discharge: 2020-10-16 | DRG: 282 | Disposition: A | Discharge status: Home / Self Care | Payer: Medicare Other | Attending: Internal Medicine | Admitting: Internal Medicine

## 2020-10-13 ENCOUNTER — Other Ambulatory Visit: Payer: Self-pay

## 2020-10-13 ENCOUNTER — Emergency Department (HOSPITAL_COMMUNITY): Payer: Medicare Other

## 2020-10-13 ENCOUNTER — Encounter (HOSPITAL_COMMUNITY): Payer: Self-pay | Admitting: Emergency Medicine

## 2020-10-13 DIAGNOSIS — Z880 Allergy status to penicillin: Secondary | ICD-10-CM

## 2020-10-13 DIAGNOSIS — Z20822 Contact with and (suspected) exposure to covid-19: Secondary | ICD-10-CM | POA: Diagnosis present

## 2020-10-13 DIAGNOSIS — J439 Emphysema, unspecified: Secondary | ICD-10-CM | POA: Diagnosis present

## 2020-10-13 DIAGNOSIS — Z90710 Acquired absence of both cervix and uterus: Secondary | ICD-10-CM

## 2020-10-13 DIAGNOSIS — R739 Hyperglycemia, unspecified: Secondary | ICD-10-CM | POA: Diagnosis present

## 2020-10-13 DIAGNOSIS — Z6834 Body mass index (BMI) 34.0-34.9, adult: Secondary | ICD-10-CM

## 2020-10-13 DIAGNOSIS — I209 Angina pectoris, unspecified: Secondary | ICD-10-CM | POA: Diagnosis present

## 2020-10-13 DIAGNOSIS — Z79899 Other long term (current) drug therapy: Secondary | ICD-10-CM

## 2020-10-13 DIAGNOSIS — I2510 Atherosclerotic heart disease of native coronary artery without angina pectoris: Secondary | ICD-10-CM | POA: Diagnosis present

## 2020-10-13 DIAGNOSIS — R0989 Other specified symptoms and signs involving the circulatory and respiratory systems: Secondary | ICD-10-CM

## 2020-10-13 DIAGNOSIS — R079 Chest pain, unspecified: Secondary | ICD-10-CM

## 2020-10-13 DIAGNOSIS — I10 Essential (primary) hypertension: Secondary | ICD-10-CM | POA: Diagnosis present

## 2020-10-13 DIAGNOSIS — Z79890 Hormone replacement therapy (postmenopausal): Secondary | ICD-10-CM

## 2020-10-13 DIAGNOSIS — R778 Other specified abnormalities of plasma proteins: Secondary | ICD-10-CM

## 2020-10-13 DIAGNOSIS — I447 Left bundle-branch block, unspecified: Secondary | ICD-10-CM | POA: Diagnosis present

## 2020-10-13 DIAGNOSIS — Z7982 Long term (current) use of aspirin: Secondary | ICD-10-CM

## 2020-10-13 DIAGNOSIS — E669 Obesity, unspecified: Secondary | ICD-10-CM

## 2020-10-13 DIAGNOSIS — I214 Non-ST elevation (NSTEMI) myocardial infarction: Principal | ICD-10-CM | POA: Diagnosis present

## 2020-10-13 DIAGNOSIS — Z87891 Personal history of nicotine dependence: Secondary | ICD-10-CM

## 2020-10-13 DIAGNOSIS — R0789 Other chest pain: Secondary | ICD-10-CM

## 2020-10-13 DIAGNOSIS — I771 Stricture of artery: Secondary | ICD-10-CM

## 2020-10-13 DIAGNOSIS — Z791 Long term (current) use of non-steroidal anti-inflammatories (NSAID): Secondary | ICD-10-CM

## 2020-10-13 DIAGNOSIS — I6523 Occlusion and stenosis of bilateral carotid arteries: Secondary | ICD-10-CM | POA: Diagnosis present

## 2020-10-13 DIAGNOSIS — Z888 Allergy status to other drugs, medicaments and biological substances status: Secondary | ICD-10-CM

## 2020-10-13 DIAGNOSIS — K219 Gastro-esophageal reflux disease without esophagitis: Secondary | ICD-10-CM | POA: Diagnosis present

## 2020-10-13 DIAGNOSIS — R072 Precordial pain: Secondary | ICD-10-CM

## 2020-10-13 DIAGNOSIS — R9431 Abnormal electrocardiogram [ECG] [EKG]: Secondary | ICD-10-CM

## 2020-10-13 DIAGNOSIS — Z885 Allergy status to narcotic agent status: Secondary | ICD-10-CM

## 2020-10-13 DIAGNOSIS — I708 Atherosclerosis of other arteries: Secondary | ICD-10-CM | POA: Diagnosis present

## 2020-10-13 LAB — CBC WITH DIFFERENTIAL/PLATELET
Abs Immature Granulocytes: 0.03 10*3/uL (ref 0.00–0.07)
Basophils Absolute: 0.1 10*3/uL (ref 0.0–0.1)
Basophils Relative: 1 %
Eosinophils Absolute: 0.1 10*3/uL (ref 0.0–0.5)
Eosinophils Relative: 1 %
HCT: 41 % (ref 36.0–46.0)
Hemoglobin: 13.1 g/dL (ref 12.0–15.0)
Immature Granulocytes: 0 %
Lymphocytes Relative: 18 %
Lymphs Abs: 1.8 10*3/uL (ref 0.7–4.0)
MCH: 27.1 pg (ref 26.0–34.0)
MCHC: 32 g/dL (ref 30.0–36.0)
MCV: 84.7 fL (ref 80.0–100.0)
Monocytes Absolute: 0.7 10*3/uL (ref 0.1–1.0)
Monocytes Relative: 7 %
Neutro Abs: 7.3 10*3/uL (ref 1.7–7.7)
Neutrophils Relative %: 73 %
Platelets: 317 10*3/uL (ref 150–400)
RBC: 4.84 MIL/uL (ref 3.87–5.11)
RDW: 14.6 % (ref 11.5–15.5)
WBC: 10 10*3/uL (ref 4.0–10.5)
nRBC: 0 % (ref 0.0–0.2)

## 2020-10-13 LAB — BASIC METABOLIC PANEL
Anion gap: 9 (ref 5–15)
BUN: 23 mg/dL (ref 8–23)
CO2: 25 mmol/L (ref 22–32)
Calcium: 10.2 mg/dL (ref 8.9–10.3)
Chloride: 102 mmol/L (ref 98–111)
Creatinine, Ser: 0.75 mg/dL (ref 0.44–1.00)
GFR, Estimated: >60 mL/min (ref >60)
Glucose, Bld: 115 mg/dL — ABNORMAL HIGH (ref 70–99)
Potassium: 3.5 mmol/L (ref 3.5–5.1)
Sodium: 136 mmol/L (ref 135–145)

## 2020-10-13 LAB — PROTIME-INR
INR: 0.9 (ref 0.8–1.2)
Prothrombin Time: 12.1 seconds (ref 11.4–15.2)

## 2020-10-13 LAB — TROPONIN I (HIGH SENSITIVITY): Troponin I (High Sensitivity): 31 ng/L — ABNORMAL HIGH (ref <18)

## 2020-10-13 IMAGING — CT CT ANGIO CHEST
2 of 6 series · 18 of 46 positions shown · IV contrast (Omnipaque or Isovue)
Comparison: None.

CLINICAL DATA: Pulmonary embolism, tachycardia, chest pain

EXAM:
CT ANGIOGRAPHY CHEST WITH CONTRAST
TECHNIQUE: Multidetector CT imaging of the chest was performed using the
standard protocol during bolus administration of intravenous
contrast. Multiplanar CT image reconstructions and MIPs were
obtained to evaluate the vascular anatomy.
CONTRAST:  100mL OMNIPAQUE IOHEXOL 350 MG/ML SOLN

[Series 5: pe axial thins · axial · 0.57mm/px · z∈[+1359,+1585]mm · 15 of 309 slices shown]
[im 13/309  lung]
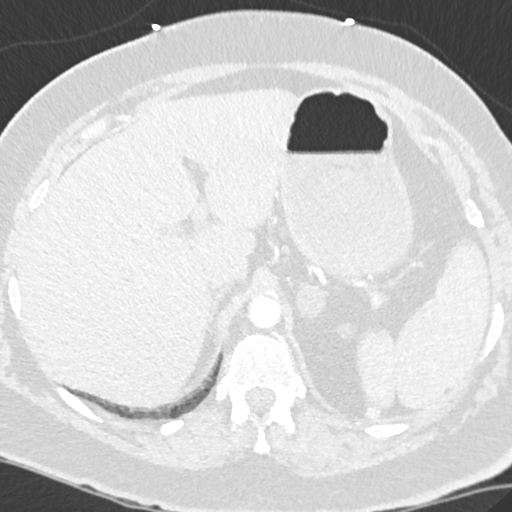
[im 39/309  soft-tissue]
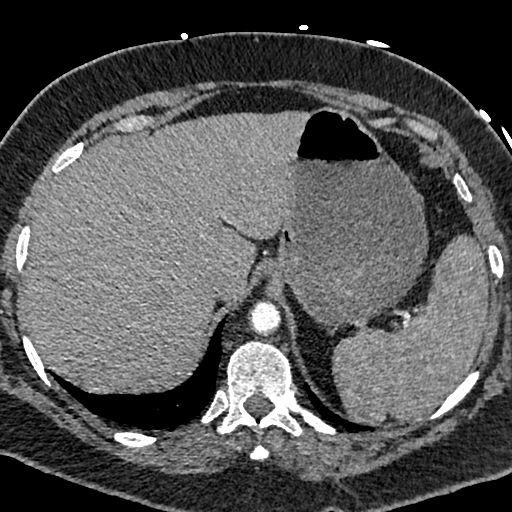
[im 52/309  lung]
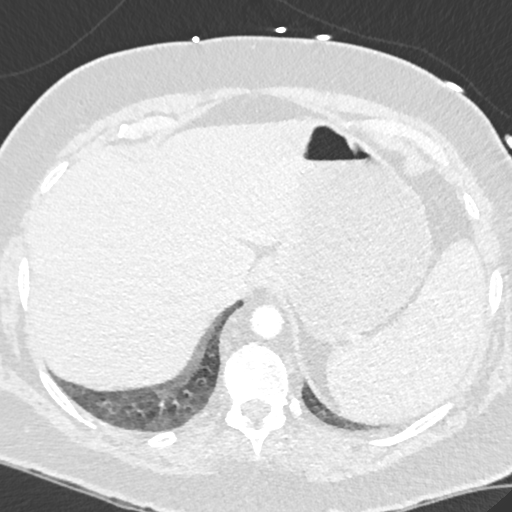
[im 78/309  soft-tissue]
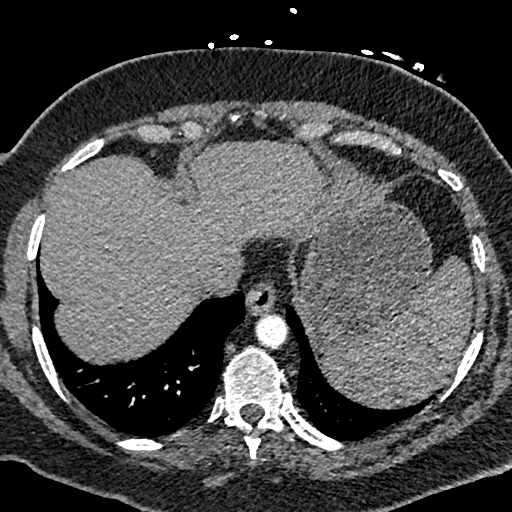
[im 90/309  lung]
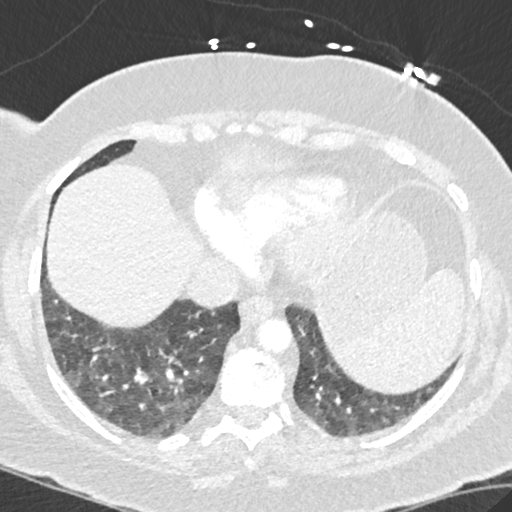
[im 116/309  soft-tissue]
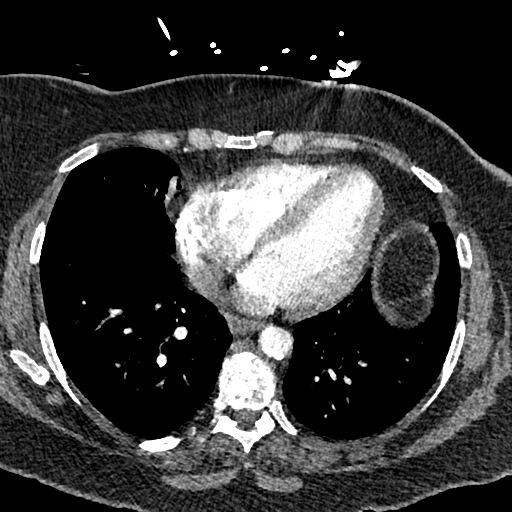
[im 129/309  lung]
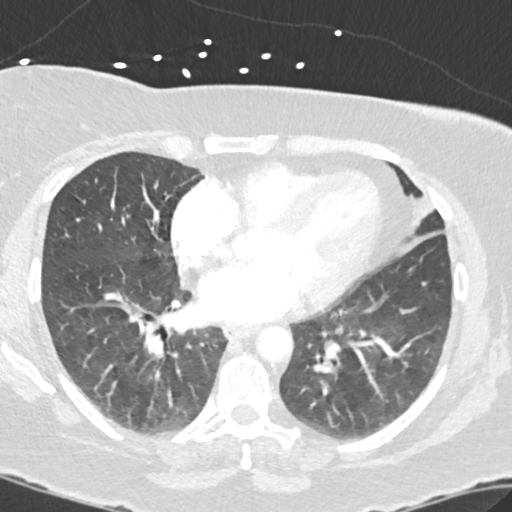
[im 155/309  soft-tissue]
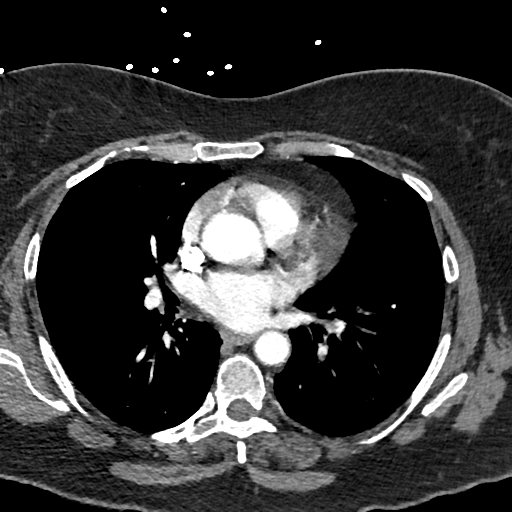
[im 180/309  lung]
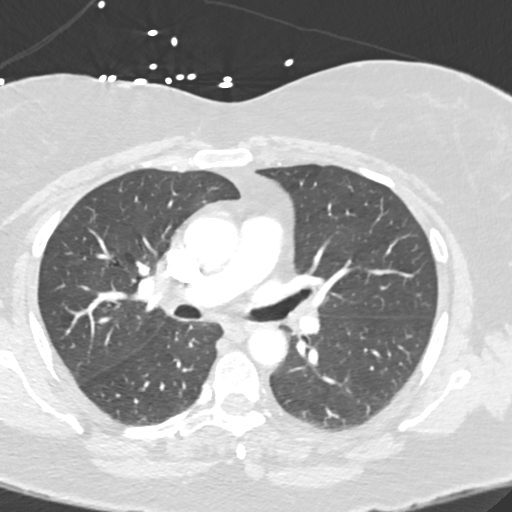
[im 193/309  soft-tissue]
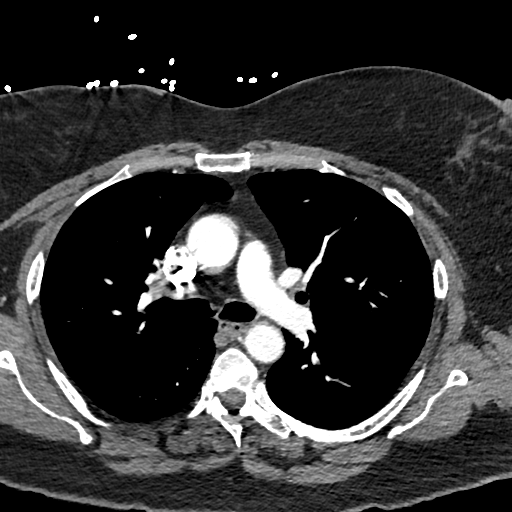
[im 219/309  lung]
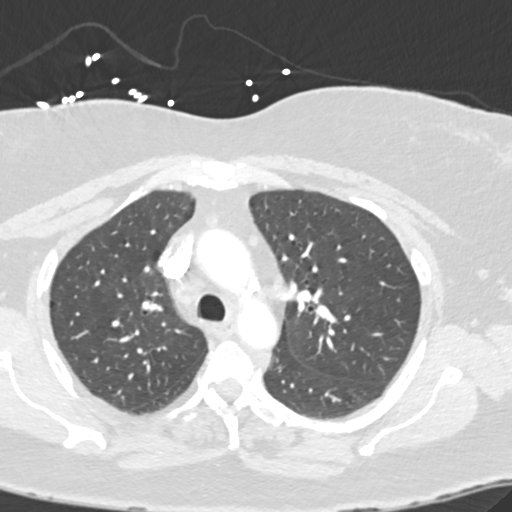
[im 232/309  soft-tissue]
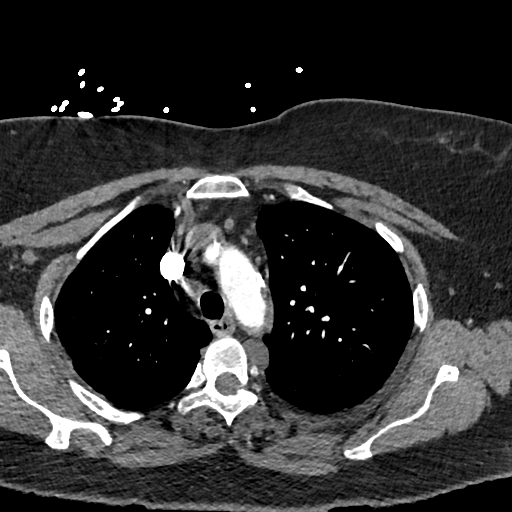
[im 257/309  lung]
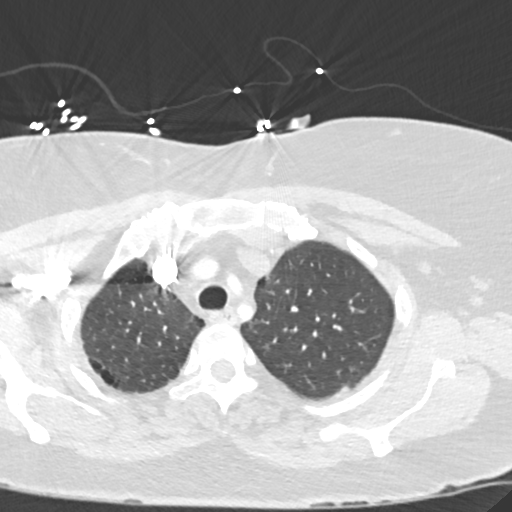
[im 270/309  soft-tissue]
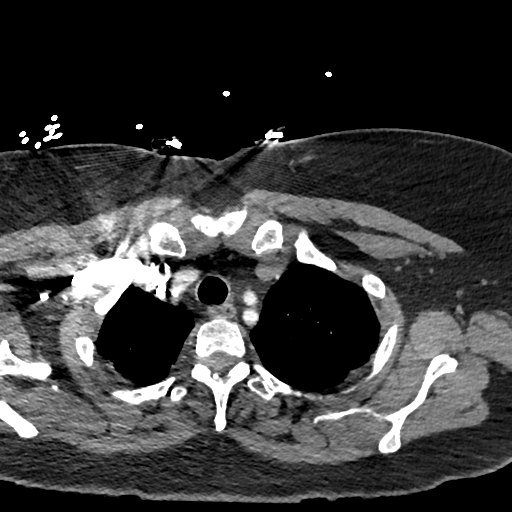
[im 296/309  lung]
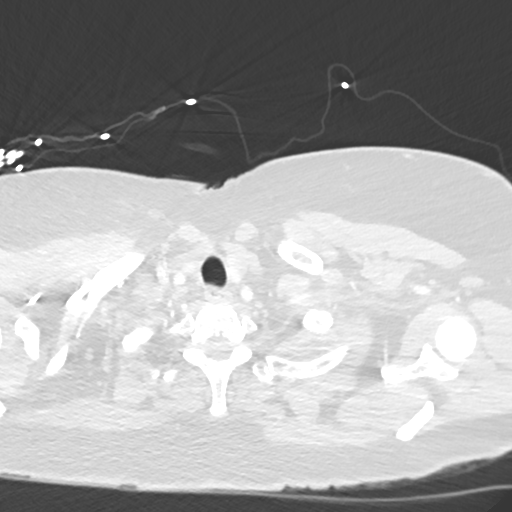

[Series 8: cor soft · coronal · 0.53mm/px · 3 of 121 slices shown]
[im 31/121  soft-tissue]
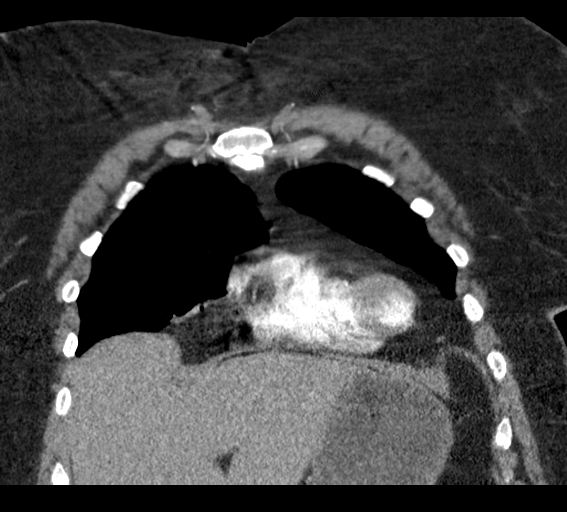
[im 61/121  soft-tissue]
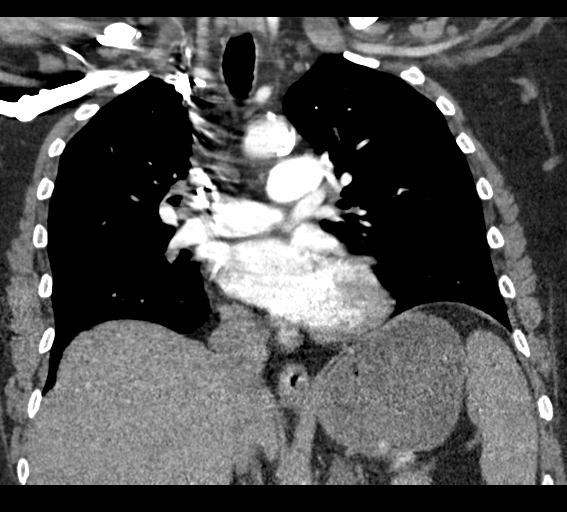
[im 91/121  soft-tissue]
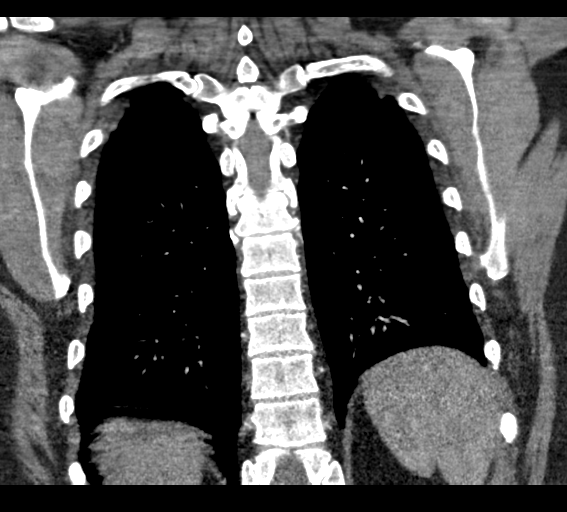

[18 of 46 positions shown; findings below may reference images not displayed]

FINDINGS: Cardiovascular: There is adequate opacification of the pulmonary
arterial tree. No intraluminal filling defect identified to suggest
acute pulmonary embolism. The central pulmonary arteries are of
normal caliber.

Mild global cardiomegaly. No pericardial effusion. No significant
coronary artery calcification. The thoracic aorta is of normal
caliber. Mild atherosclerotic calcification within the thoracic
aorta.

Mediastinum/Nodes: No enlarged mediastinal, hilar, or axillary lymph
nodes. Thyroid gland, trachea, and esophagus demonstrate no
significant findings.

Lungs/Pleura: Mild biapical paraseptal emphysema. Mild bibasilar
atelectasis. Minimal scarring within the medial right middle lobe
with associated cylindrical bronchiectasis, likely post infectious
or post inflammatory in nature. No superimposed focal pulmonary
infiltrate. No pneumothorax or pleural effusion. No central
obstructing lesion.

Upper Abdomen: Probable left adrenal adenoma is incompletely
evaluated on this examination. No acute abnormality within the
visualized upper abdomen.

Musculoskeletal: No acute bone abnormality. No lytic or blastic bone
lesion identified.

Review of the MIP images confirms the above findings.
IMPRESSION: No acute pulmonary embolism. No acute intrathoracic pathology
identified.

Mild global cardiomegaly.

Mild biapical paraseptal emphysema.

Probable left adrenal adenoma, incompletely evaluated on this
examination. If indicated, this could be better assessed with
dedicated CT or MRI examination.

Aortic Atherosclerosis ([U0]-[U0]) and Emphysema ([U0]-[U0]).

## 2020-10-13 MED ORDER — IOHEXOL 350 MG/ML SOLN
100.0000 mL | Freq: Once | INTRAVENOUS | Status: AC | PRN
  Administered 2020-10-13: 100 mL via INTRAVENOUS

## 2020-10-13 MED ORDER — ASPIRIN 81 MG PO CHEW
243.0000 mg | CHEWABLE_TABLET | Freq: Once | ORAL | Status: AC
  Administered 2020-10-13: 243 mg via ORAL
  Filled 2020-10-13: qty 3

## 2020-10-13 NOTE — ED Triage Notes (Signed)
Pt c/o sudden chest pain that started tonight about 1915. Pt took ASA and benadryl prior to arrival. Pt very anxious during triage.

## 2020-10-13 NOTE — ED Provider Notes (Signed)
[facility] EMERGENCY DEPARTMENT Provider Note  CSN: [id number] Arrival date & time: 10/13/20 2048    History Chief Complaint  Patient presents with   Chest Pain    Carmen Morgan is a 65 y.o. female with history of HTN, no DM or HLD who reports she was eating dinner tonight (shrimp) when she had sudden onset of severe midsternal chest pressure with SOB. Not radiating. Not associated with diaphoresis but she felt flushed. She took some benadryl initially because she thought she might be having an allergic reaction and then took '81mg'$  ASA. She states the symptoms have improved but not completely resolved at the time of my evaluation. She has had a hysterectomy/oophorectomy and is on estradiol supplements but does not smoke. She has never had any heart problems but reports once when she woke up from a surgery the anesthesiologist told her something looked wrong on her monitor and she needed to see a cardiologist, however she did not see anyone and isn't sure what the abnormality was. She has had two similar episodes in recent months that have improved without intervention and she did not seek medical attention. She had Covid about a month ago and has essentially recovered except for some mild continued cough.    Past Medical History:  Diagnosis Date   GERD (gastroesophageal reflux disease)    Hypertension    Seasonal allergies     Past Surgical History:  Procedure Laterality Date   ABDOMINAL HYSTERECTOMY  2014   BACK SURGERY  2012   COLONOSCOPY N/A 02/16/2019   Procedure: COLONOSCOPY;  Surgeon: Daneil Dolin, MD;  Location: [facility] ENDO SUITE;  Service: Endoscopy;  Laterality: N/A;  2:00   ESOPHAGOGASTRODUODENOSCOPY N/A 01/18/2020   Procedure: ESOPHAGOGASTRODUODENOSCOPY (EGD);  Surgeon: Daneil Dolin, MD;  Location: [facility] ENDO SUITE;  Service: Endoscopy;  Laterality: N/A;  2:45pm   left breast cystectomy  [place] N/A 01/18/2020   Procedure: MALONEY DILATION;  Surgeon: Daneil Dolin, MD;  Location: [facility] ENDO SUITE;  Service: Endoscopy;  Laterality: N/A;   VEIN SURGERY  2013    Family History  Problem Relation Age of Onset   Colon cancer Neg Hx     Social History   Tobacco Use   Smoking status: Former   Smokeless tobacco: Never  Scientific laboratory technician Use: Former  Substance Use Topics   Alcohol use: Never   Drug use: Never     Home Medications Prior to Admission medications   Medication Sig Start Date End Date Taking? Authorizing Provider  acetaminophen (TYLENOL) 500 MG tablet Take 1,000 mg by mouth every 6 (six) hours as needed for moderate pain or headache.    [provider]  aspirin EC 81 MG tablet Take 81 mg by mouth daily.    [provider]  Cholecalciferol (VITAMIN D3) 50 MCG (2000 UT) TABS Take 2,000 Units by mouth daily.     [provider]  cyclobenzaprine (FLEXERIL) 10 MG tablet Take 10 mg by mouth 3 (three) times daily as needed for muscle spasms.    [provider]  estradiol (ESTRACE) 0.5 MG tablet Take 0.5 mg by mouth at bedtime.     [provider]  fexofenadine (ALLEGRA) 180 MG tablet Take 180 mg by mouth daily.    [provider]  fluticasone (FLONASE) 50 MCG/ACT nasal spray Place 2 sprays into both nostrils daily.     [provider]  ketoconazole (NIZORAL) 2 % shampoo Apply  1 application topically daily as needed for psychosis. 01/03/20   [provider]  lisinopril-hydrochlorothiazide (ZESTORETIC) 20-25 MG tablet Take 1 tablet by mouth daily.    [provider]  Melatonin 10 MG CAPS Take 10 mg by mouth at bedtime.     [provider]  meloxicam (MOBIC) 15 MG tablet Take 15 mg by mouth daily as needed for pain.  11/27/19   [provider]  omeprazole (PRILOSEC) 40 MG capsule TAKE 1 CAPSULE IN THE MORNING AND AT BEDTIME 10/11/20   Annitta Needs, NP  OVER THE COUNTER MEDICATION Take 1 capsule by mouth daily. Vari-Gone otc supplement     [provider]  pantoprazole (PROTONIX) 40 MG tablet TAKE 1 TABLET BY MOUTH TWICE A DAY 08/07/20   Annitta Needs, NP  Potassium Gluconate 550 (90 K) MG TABS Take 550 mg by mouth daily.    [provider]  solifenacin (VESICARE) 10 MG tablet Take 10 mg by mouth daily.     [provider]  traMADol (ULTRAM) 50 MG tablet Take 50 mg by mouth 2 (two) times daily as needed for pain. 01/06/20   [provider]     Allergies    Penicillins, Vitamin b12, and Codeine   Review of Systems   Review of Systems A comprehensive review of systems was completed and negative except as noted in HPI.    Physical Exam BP (!) 90/56   Pulse 86   Temp 98 F (36.7 C) (Oral)   Resp 17   Ht '5\' 1"'$  (1.549 m)   Wt 82.6 kg   SpO2 93%   BMI 34.39 kg/m   Physical Exam Vitals and nursing note reviewed.  Constitutional:      Appearance: Normal appearance.  HENT:     Head: Normocephalic and atraumatic.     Nose: Nose normal.     Mouth/Throat:     Mouth: Mucous membranes are moist.  Eyes:     Extraocular Movements: Extraocular movements intact.     Conjunctiva/sclera: Conjunctivae normal.  Cardiovascular:     Rate and Rhythm: Regular rhythm. Tachycardia present.  Pulmonary:     Effort: Pulmonary effort is normal.     Breath sounds: Normal breath sounds. No wheezing or rales.  Abdominal:     General: Abdomen is flat.     Palpations: Abdomen is soft.     Tenderness: There is no abdominal tenderness.  Musculoskeletal:        General: No swelling. Normal range of motion.     Cervical back: Neck supple.     Right lower leg: No edema.     Left lower leg: No edema.  Skin:    General: Skin is warm and dry.  Neurological:     General: No focal deficit present.     Mental Status: She is alert.  Psychiatric:        Mood and Affect: Mood normal.     ED Results / Procedures / Treatments   Labs (all labs ordered are listed, but only abnormal results are  displayed) Labs Reviewed  BASIC METABOLIC PANEL - Abnormal; Notable for the following components:      Result Value   Glucose, Bld 115 (*)    All other components within normal limits  TROPONIN I (HIGH SENSITIVITY) - Abnormal; Notable for the following components:   Troponin I (High Sensitivity) 31 (*)    All other components within normal limits  CBC WITH DIFFERENTIAL/PLATELET  PROTIME-INR  TROPONIN I (HIGH SENSITIVITY)    EKG EKG Interpretation  Date/Time:  Saturday October 13 2020 21:14:00 EDT Ventricular Rate:  102 PR Interval:  135 QRS Duration: 137 QT Interval:  378 QTC Calculation: 493 R Axis:   28 Text Interpretation: Sinus tachycardia Probable left atrial enlargement Left bundle branch block No old tracing to compare Confirmed by Calvert Cantor [phone]) on 10/13/2020 9:25:17 PM  Radiology CT Angio Chest PE W/Cm &/Or Wo Cm  Result Date: 10/13/2020 CLINICAL DATA:  Pulmonary embolism, tachycardia, chest pain EXAM: CT ANGIOGRAPHY CHEST WITH CONTRAST TECHNIQUE: Multidetector CT imaging of the chest was performed using the standard protocol during bolus administration of intravenous contrast. Multiplanar CT image reconstructions and MIPs were obtained to evaluate the vascular anatomy. CONTRAST:  174m OMNIPAQUE IOHEXOL 350 MG/ML SOLN COMPARISON:  None. FINDINGS: Cardiovascular: There is adequate opacification of the pulmonary arterial tree. No intraluminal filling defect identified to suggest acute pulmonary embolism. The central pulmonary arteries are of normal caliber. Mild global cardiomegaly. No pericardial effusion. No significant coronary artery calcification. The thoracic aorta is of normal caliber. Mild atherosclerotic calcification within the thoracic aorta. Mediastinum/Nodes: No enlarged mediastinal, hilar, or axillary lymph nodes. Thyroid gland, trachea, and esophagus demonstrate no significant findings. Lungs/Pleura: Mild biapical paraseptal emphysema. Mild bibasilar  atelectasis. Minimal scarring within the medial right middle lobe with associated cylindrical bronchiectasis, likely post infectious or post inflammatory in nature. No superimposed focal pulmonary infiltrate. No pneumothorax or pleural effusion. No central obstructing lesion. Upper Abdomen: Probable left adrenal adenoma is incompletely evaluated on this examination. No acute abnormality within the visualized upper abdomen. Musculoskeletal: No acute bone abnormality. No lytic or blastic bone lesion identified. Review of the MIP images confirms the above findings. IMPRESSION: No acute pulmonary embolism. No acute intrathoracic pathology identified. Mild global cardiomegaly. Mild biapical paraseptal emphysema. Probable left adrenal adenoma, incompletely evaluated on this examination. If indicated, this could be better assessed with dedicated CT or MRI examination. Aortic Atherosclerosis (ICD10-I70.0) and Emphysema (ICD10-J43.9). Electronically Signed   By: AFidela SalisburyMD   On: 10/13/2020 23:29    Procedures Procedures  Medications Ordered in the ED Medications  aspirin chewable tablet 243 mg (243 mg Oral Given 10/13/20 2152)  iohexol (OMNIPAQUE) 350 MG/ML injection 100 mL (100 mLs Intravenous Contrast Given 10/13/20 2305)     MDM Rules/Calculators/A&P MDM Patient with chest pain, relatively low risk for ACS but recent covid and estrogen supplements increase her risk of PE. Will check labs and send for CTA.   ED Course  I have reviewed the triage vital signs and the nursing notes.  Pertinent labs & imaging results that were available during my care of the patient were reviewed by me and considered in my medical decision making (see chart for details).  Clinical Course as of 10/14/20 0022  Sat Oct 13, 2020  2151 EKG today with LBBB, no old to compare.  [CS]  2233 CBC is normal.  [CS]  2244 INR normal.  [CS]  2256 BMP is normal. First trop is mildly elevated. No priors to compare.  [CS]  Sun  Oct 14, 2020  0021 Care of the patient signed out to Dr. GRoxanne Minsat the change of shift pending repeat Trop.  [CS]    Clinical Course User Index [CS] STruddie Hidden MD    Final Clinical Impression(s) / ED Diagnoses Final diagnoses:  Chest pain, unspecified type    Rx / DC Orders ED Discharge Orders     None  Truddie Hidden, MD 10/14/20 Benancio Deeds

## 2020-10-13 NOTE — ED Notes (Signed)
ED Provider at bedside. 

## 2020-10-13 NOTE — ED Notes (Signed)
Pt transported to CT ?

## 2020-10-14 ENCOUNTER — Observation Stay (HOSPITAL_COMMUNITY): Payer: Medicare Other

## 2020-10-14 DIAGNOSIS — Z888 Allergy status to other drugs, medicaments and biological substances status: Secondary | ICD-10-CM | POA: Diagnosis not present

## 2020-10-14 DIAGNOSIS — I447 Left bundle-branch block, unspecified: Secondary | ICD-10-CM | POA: Diagnosis not present

## 2020-10-14 DIAGNOSIS — K219 Gastro-esophageal reflux disease without esophagitis: Secondary | ICD-10-CM | POA: Diagnosis not present

## 2020-10-14 DIAGNOSIS — Z7982 Long term (current) use of aspirin: Secondary | ICD-10-CM | POA: Diagnosis not present

## 2020-10-14 DIAGNOSIS — E669 Obesity, unspecified: Secondary | ICD-10-CM

## 2020-10-14 DIAGNOSIS — Z791 Long term (current) use of non-steroidal anti-inflammatories (NSAID): Secondary | ICD-10-CM | POA: Diagnosis not present

## 2020-10-14 DIAGNOSIS — R0789 Other chest pain: Secondary | ICD-10-CM | POA: Diagnosis not present

## 2020-10-14 DIAGNOSIS — Z79890 Hormone replacement therapy (postmenopausal): Secondary | ICD-10-CM | POA: Diagnosis not present

## 2020-10-14 DIAGNOSIS — Z87891 Personal history of nicotine dependence: Secondary | ICD-10-CM

## 2020-10-14 DIAGNOSIS — I10 Essential (primary) hypertension: Secondary | ICD-10-CM

## 2020-10-14 DIAGNOSIS — Z885 Allergy status to narcotic agent status: Secondary | ICD-10-CM | POA: Diagnosis not present

## 2020-10-14 DIAGNOSIS — I708 Atherosclerosis of other arteries: Secondary | ICD-10-CM | POA: Diagnosis not present

## 2020-10-14 DIAGNOSIS — Z90710 Acquired absence of both cervix and uterus: Secondary | ICD-10-CM | POA: Diagnosis not present

## 2020-10-14 DIAGNOSIS — R739 Hyperglycemia, unspecified: Secondary | ICD-10-CM | POA: Diagnosis not present

## 2020-10-14 DIAGNOSIS — I214 Non-ST elevation (NSTEMI) myocardial infarction: Secondary | ICD-10-CM | POA: Diagnosis not present

## 2020-10-14 DIAGNOSIS — R0989 Other specified symptoms and signs involving the circulatory and respiratory systems: Secondary | ICD-10-CM

## 2020-10-14 DIAGNOSIS — J439 Emphysema, unspecified: Secondary | ICD-10-CM | POA: Diagnosis not present

## 2020-10-14 DIAGNOSIS — R7989 Other specified abnormal findings of blood chemistry: Secondary | ICD-10-CM

## 2020-10-14 DIAGNOSIS — R072 Precordial pain: Secondary | ICD-10-CM

## 2020-10-14 DIAGNOSIS — Z79899 Other long term (current) drug therapy: Secondary | ICD-10-CM | POA: Diagnosis not present

## 2020-10-14 DIAGNOSIS — R9431 Abnormal electrocardiogram [ECG] [EKG]: Secondary | ICD-10-CM

## 2020-10-14 DIAGNOSIS — I2510 Atherosclerotic heart disease of native coronary artery without angina pectoris: Secondary | ICD-10-CM | POA: Diagnosis not present

## 2020-10-14 DIAGNOSIS — Z20822 Contact with and (suspected) exposure to covid-19: Secondary | ICD-10-CM | POA: Diagnosis not present

## 2020-10-14 DIAGNOSIS — I6523 Occlusion and stenosis of bilateral carotid arteries: Secondary | ICD-10-CM | POA: Diagnosis not present

## 2020-10-14 DIAGNOSIS — R778 Other specified abnormalities of plasma proteins: Secondary | ICD-10-CM | POA: Diagnosis not present

## 2020-10-14 DIAGNOSIS — R079 Chest pain, unspecified: Secondary | ICD-10-CM | POA: Diagnosis present

## 2020-10-14 DIAGNOSIS — Z880 Allergy status to penicillin: Secondary | ICD-10-CM | POA: Diagnosis not present

## 2020-10-14 DIAGNOSIS — Z6834 Body mass index (BMI) 34.0-34.9, adult: Secondary | ICD-10-CM | POA: Diagnosis not present

## 2020-10-14 LAB — COMPREHENSIVE METABOLIC PANEL
ALT: 20 U/L (ref 0–44)
AST: 33 U/L (ref 15–41)
Albumin: 3.7 g/dL (ref 3.5–5.0)
Alkaline Phosphatase: 85 U/L (ref 38–126)
Anion gap: 8 (ref 5–15)
BUN: 17 mg/dL (ref 8–23)
CO2: 24 mmol/L (ref 22–32)
Calcium: 9.7 mg/dL (ref 8.9–10.3)
Chloride: 105 mmol/L (ref 98–111)
Creatinine, Ser: 0.62 mg/dL (ref 0.44–1.00)
GFR, Estimated: >60 mL/min (ref >60)
Glucose, Bld: 123 mg/dL — ABNORMAL HIGH (ref 70–99)
Potassium: 3.8 mmol/L (ref 3.5–5.1)
Sodium: 137 mmol/L (ref 135–145)
Total Bilirubin: 0.4 mg/dL (ref 0.3–1.2)
Total Protein: 7.4 g/dL (ref 6.5–8.1)

## 2020-10-14 LAB — ECHOCARDIOGRAM COMPLETE
Area-P 1/2: 3.77 cm2
Height: 61 in
S' Lateral: 2.36 cm
Weight: 2912 oz

## 2020-10-14 LAB — PROTIME-INR
INR: 0.9 (ref 0.8–1.2)
Prothrombin Time: 12.4 seconds (ref 11.4–15.2)

## 2020-10-14 LAB — CBC
HCT: 39.8 % (ref 36.0–46.0)
Hemoglobin: 12.6 g/dL (ref 12.0–15.0)
MCH: 26.6 pg (ref 26.0–34.0)
MCHC: 31.7 g/dL (ref 30.0–36.0)
MCV: 84 fL (ref 80.0–100.0)
Platelets: 276 10*3/uL (ref 150–400)
RBC: 4.74 MIL/uL (ref 3.87–5.11)
RDW: 14.6 % (ref 11.5–15.5)
WBC: 8.1 10*3/uL (ref 4.0–10.5)
nRBC: 0 % (ref 0.0–0.2)

## 2020-10-14 LAB — RESP PANEL BY RT-PCR (FLU A&B, COVID) ARPGX2
Influenza A by PCR: NEGATIVE
Influenza B by PCR: NEGATIVE
SARS Coronavirus 2 by RT PCR: NEGATIVE

## 2020-10-14 LAB — HEPARIN LEVEL (UNFRACTIONATED)
Heparin Unfractionated: 0.19 IU/mL — ABNORMAL LOW (ref 0.30–0.70)
Heparin Unfractionated: 0.26 IU/mL — ABNORMAL LOW (ref 0.30–0.70)

## 2020-10-14 LAB — MAGNESIUM: Magnesium: 2 mg/dL (ref 1.7–2.4)

## 2020-10-14 LAB — PHOSPHORUS: Phosphorus: 2.5 mg/dL (ref 2.5–4.6)

## 2020-10-14 LAB — HIV ANTIBODY (ROUTINE TESTING W REFLEX): HIV Screen 4th Generation wRfx: NONREACTIVE

## 2020-10-14 LAB — TROPONIN I (HIGH SENSITIVITY)
Troponin I (High Sensitivity): 20 ng/L — ABNORMAL HIGH (ref <18)
Troponin I (High Sensitivity): 29 ng/L — ABNORMAL HIGH (ref <18)
Troponin I (High Sensitivity): 39 ng/L — ABNORMAL HIGH (ref <18)

## 2020-10-14 LAB — APTT: aPTT: 30 seconds (ref 24–36)

## 2020-10-14 MED ORDER — HEPARIN BOLUS VIA INFUSION
1200.0000 [IU] | Freq: Once | INTRAVENOUS | Status: AC
  Administered 2020-10-14: 1200 [IU] via INTRAVENOUS

## 2020-10-14 MED ORDER — AMLODIPINE BESYLATE 5 MG PO TABS
5.0000 mg | ORAL_TABLET | Freq: Every day | ORAL | Status: DC
  Administered 2020-10-15: 5 mg via ORAL
  Filled 2020-10-14: qty 1

## 2020-10-14 MED ORDER — ALBUTEROL SULFATE (2.5 MG/3ML) 0.083% IN NEBU: 3.0000 mL | INHALATION_SOLUTION | RESPIRATORY_TRACT | Status: DC | PRN

## 2020-10-14 MED ORDER — HEPARIN BOLUS VIA INFUSION
4000.0000 [IU] | Freq: Once | INTRAVENOUS | Status: AC
  Administered 2020-10-14: 4000 [IU] via INTRAVENOUS

## 2020-10-14 MED ORDER — METOPROLOL TARTRATE 50 MG PO TABS
50.0000 mg | ORAL_TABLET | Freq: Two times a day (BID) | ORAL | Status: DC
  Administered 2020-10-15 – 2020-10-16 (×3): 50 mg via ORAL
  Filled 2020-10-14 (×3): qty 1

## 2020-10-14 MED ORDER — LABETALOL HCL 5 MG/ML IV SOLN: 10.0000 mg | INTRAVENOUS | Status: DC | PRN

## 2020-10-14 MED ORDER — PANTOPRAZOLE SODIUM 40 MG PO TBEC
40.0000 mg | DELAYED_RELEASE_TABLET | Freq: Every day | ORAL | Status: DC
  Administered 2020-10-14 – 2020-10-16 (×3): 40 mg via ORAL
  Filled 2020-10-14 (×3): qty 1

## 2020-10-14 MED ORDER — ASPIRIN EC 81 MG PO TBEC
81.0000 mg | DELAYED_RELEASE_TABLET | Freq: Every day | ORAL | Status: DC
Start: 2020-10-15 — End: 2020-10-16
  Administered 2020-10-15 – 2020-10-16 (×2): 81 mg via ORAL
  Filled 2020-10-14 (×2): qty 1

## 2020-10-14 MED ORDER — HEPARIN (PORCINE) 25000 UT/250ML-% IV SOLN
1250.0000 [IU]/h | INTRAVENOUS | Status: AC
  Administered 2020-10-14: 1000 [IU]/h via INTRAVENOUS
  Administered 2020-10-14 – 2020-10-15 (×2): 1250 [IU]/h via INTRAVENOUS
  Filled 2020-10-14 (×3): qty 250

## 2020-10-14 NOTE — ED Notes (Signed)
[facility] here for pt. 

## 2020-10-14 NOTE — ED Notes (Signed)
Hospitalist at bedside 

## 2020-10-14 NOTE — ED Provider Notes (Signed)
Care assumed from Dr. Karle Starch, chest pain with slightly elevated initial troponin, pending second troponin. Will need to be admitted for further evaluation regardless of result.  Troponin has increased modestly.  She continues to be pain-free.  In light of increasing troponin, she is started on heparin.  Case has been discussed with Dr. Terri Skains, on-call for cardiology.  He requests patient be admitted to hospitalist service and he will see in consult.  Case is discussed with Dr. Josephine Cables of [facility] hospitalists, who agrees to admit the patient.  CRITICAL CARE Performed by: Delora Fuel Total critical care time: 35 minutes Critical care time was exclusive of separately billable procedures and treating other patients. Critical care was necessary to treat or prevent imminent or life-threatening deterioration. Critical care was time spent personally by me on the following activities: development of treatment plan with patient and/or surrogate as well as nursing, discussions with consultants, evaluation of patient's response to treatment, examination of patient, obtaining history from patient or surrogate, ordering and performing treatments and interventions, ordering and review of laboratory studies, ordering and review of radiographic studies, pulse oximetry and re-evaluation of patient's condition.   Delora Fuel, MD [id number] [phone]

## 2020-10-14 NOTE — ED Notes (Signed)
[facility] called to set up transportation at this time. 

## 2020-10-14 NOTE — Plan of Care (Signed)

## 2020-10-14 NOTE — Progress Notes (Signed)
ANTICOAGULATION CONSULT NOTE -   Pharmacy Consult for Heparin Indication: chest pain/ACS  Allergies  Allergen Reactions   Penicillins Hives    Did it involve swelling of the face/tongue/throat, SOB, or low BP? No Did it involve sudden or severe rash/hives, skin peeling, or any reaction on the inside of your mouth or nose? No Did you need to seek medical attention at a hospital or doctor's office? No When did it last happen?      childhood allergy  If all above answers are "NO", may proceed with cephalosporin use.    Vitamin B12 Nausea And Vomiting   Codeine Palpitations    Irregular Heart Rate    Patient Measurements: Height: '5\' 1"'$  (154.9 cm) Weight: 82.6 kg (182 lb) IBW/kg (Calculated) : 47.8 Heparin Dosing Weight: 70 kg  Vital Signs: Temp: 98 F (36.7 C) (07/30 2104) Temp Source: Oral (07/30 2104) BP: 129/78 (07/31 0505) Pulse Rate: 83 (07/31 0505)  Labs: Recent Labs    10/13/20 2200 10/13/20 2336 10/14/20 0300 10/14/20 0653  HGB 13.1  --  12.6  --   HCT 41.0  --  39.8  --   PLT 317  --  276  --   APTT  --   --  30  --   LABPROT 12.1  --  12.4  --   INR 0.9  --  0.9  --   HEPARINUNFRC  --   --   --  0.19*  CREATININE 0.75  --  0.62  --   TROPONINIHS 31* 39* 29* 20*     Estimated Creatinine Clearance: 67.4 mL/min (by C-G formula based on SCr of 0.62 mg/dL).   Medical History: Past Medical History:  Diagnosis Date   GERD (gastroesophageal reflux disease)    Hypertension    Seasonal allergies     Medications:  No current facility-administered medications on file prior to encounter.   Current Outpatient Medications on File Prior to Encounter  Medication Sig Dispense Refill   acetaminophen (TYLENOL) 500 MG tablet Take 1,000 mg by mouth every 6 (six) hours as needed for moderate pain or headache.     aspirin EC 81 MG tablet Take 81 mg by mouth daily.     Cholecalciferol (VITAMIN D3) 50 MCG (2000 UT) TABS Take 2,000 Units by mouth daily.       cyclobenzaprine (FLEXERIL) 10 MG tablet Take 10 mg by mouth 3 (three) times daily as needed for muscle spasms.     estradiol (ESTRACE) 0.5 MG tablet Take 0.5 mg by mouth at bedtime.      fexofenadine (ALLEGRA) 180 MG tablet Take 180 mg by mouth daily.     fluticasone (FLONASE) 50 MCG/ACT nasal spray Place 2 sprays into both nostrils daily.      ketoconazole (NIZORAL) 2 % shampoo Apply 1 application topically daily as needed for psychosis.     lisinopril-hydrochlorothiazide (ZESTORETIC) 20-25 MG tablet Take 1 tablet by mouth daily.     Melatonin 10 MG CAPS Take 10 mg by mouth at bedtime.      meloxicam (MOBIC) 15 MG tablet Take 15 mg by mouth daily as needed for pain.      omeprazole (PRILOSEC) 40 MG capsule TAKE 1 CAPSULE IN THE MORNING AND AT BEDTIME 60 capsule 5   OVER THE COUNTER MEDICATION Take 1 capsule by mouth daily. Vari-Gone otc supplement     pantoprazole (PROTONIX) 40 MG tablet TAKE 1 TABLET BY MOUTH TWICE A DAY 60 tablet 3   Potassium Gluconate  550 (90 K) MG TABS Take 550 mg by mouth daily.     solifenacin (VESICARE) 10 MG tablet Take 10 mg by mouth daily.      traMADol (ULTRAM) 50 MG tablet Take 50 mg by mouth 2 (two) times daily as needed for pain.       Assessment: 65 y.o. female with chest pain/possible NSTEMI.  Patient is not on anticoagulation prior to admission.  Plan to transfer to [facility].  HL 0.19- subtherapeutic but drawn 1 hour early Trop 20 CBC WNL  Goal of Therapy:  Heparin level 0.3-0.7 units/ml Monitor platelets by anticoagulation protocol: Yes   Plan:  Rebolus 1200 units x 1 Increase heparin infusion to 1150 units/hr Check heparin level in 6 hours and daily.   Margot Ables, PharmD Clinical Pharmacist 10/14/2020 7:53 AM

## 2020-10-14 NOTE — Consult Note (Signed)
CARDIOLOGY CONSULT NOTE  Patient ID: Carmen Morgan MRN: [id number] DOB/AGE: 1955-05-22 65 y.o.  Admit date: 10/13/2020 Attending physician: Elvin So* Primary Physician: Etter Sjogren, [place] Outpatient Cardiologist: NA Inpatient Cardiologist: Rex Kras, DO, [facility]  Reason of consultation: Chest Pain Referring physician: Dr. Delora Fuel  Chief complaint: Chest pain  HPI:  Carmen Morgan is a 65 y.o. Caucasian female who presents with a chief complaint of " chest pain." Her past medical history and cardiovascular risk factors include: History of COVID-19 infection (January 2020 and July 2022), hypertension, on hormone replacement therapy, former smoker, postmenopausal female, advanced age, obesity due to excess calories.  Patient states that yesterday she was about to have dinner and started having substernal chest discomfort at approximately 7:15 PM.  She describes the discomfort as a heaviness like sensation as if someone sitting on her chest, intensity 8 out of 10, lasting for 45 minutes, at times has occurred with activity in the past and does improve with resting and relaxing.  Patient was concerned that she may be having a cardiac event and goes to the hospital for further evaluation and management.  Patient states that the chest pain lasted until 3 AM this morning and now she is chest pain-free.  Patient is transfer for any planned hospital to [facility] for further evaluation and management.  Work-up thus far notes mildly elevated high sensitive troponin which is now downtrending, initial EKG noted normal sinus rhythm with left bundle branch block and ST-T changes.  Echocardiogram prior to coming to [facility] noted preserved LVEF without any obvious regional wall motion abnormalities.  No family history of premature CAD.  ALLERGIES: Allergies  Allergen Reactions   Penicillins Hives    Did it involve swelling of the face/tongue/throat, SOB, or low BP? No Did it  involve sudden or severe rash/hives, skin peeling, or any reaction on the inside of your mouth or nose? No Did you need to seek medical attention at a hospital or doctor's office? No When did it last happen?      childhood allergy  If all above answers are "NO", may proceed with cephalosporin use.    Vitamin B12 Nausea And Vomiting   Codeine Palpitations    Irregular Heart Rate    PAST MEDICAL HISTORY: Past Medical History:  Diagnosis Date   GERD (gastroesophageal reflux disease)    Hypertension    Seasonal allergies   COVID-19 infection January 2020 and July 2022  PAST SURGICAL HISTORY: Past Surgical History:  Procedure Laterality Date   ABDOMINAL HYSTERECTOMY  2014   BACK SURGERY  2012   COLONOSCOPY N/A 02/16/2019   Procedure: COLONOSCOPY;  Surgeon: Daneil Dolin, MD;  [facility];  Service: Endoscopy;  Laterality: N/A;  2:00   ESOPHAGOGASTRODUODENOSCOPY N/A 01/18/2020   Procedure: ESOPHAGOGASTRODUODENOSCOPY (EGD);  Surgeon: Daneil Dolin, MD;  Location: [facility];  Service: Endoscopy;  Laterality: N/A;  2:45pm   left breast cystectomy  [place] N/A 01/18/2020   Procedure: MALONEY DILATION;  Surgeon: Daneil Dolin, MD;  Location: [facility];  Service: Endoscopy;  Laterality: N/A;   VEIN SURGERY  2013    FAMILY HISTORY: No family history of premature CAD.   SOCIAL HISTORY:  The patient  reports that she has quit smoking. She has never used smokeless tobacco. She reports that she does not drink alcohol and does not use drugs.  Quit smoking approximately 11 years ago.  MEDICATIONS: Current Outpatient Medications  Medication Instructions   acetaminophen (TYLENOL) 1,000 mg, Oral, Every 6 hours PRN   cyclobenzaprine (FLEXERIL) 10 mg, 3 times daily PRN   estradiol (ESTRACE) 0.5 mg, Oral, Daily at bedtime   fexofenadine (ALLEGRA) 180 mg, Oral, Daily   fluticasone (FLONASE) 50 MCG/ACT nasal spray 2 sprays, Each Nare, Daily   ketoconazole  (NIZORAL) 2 % shampoo 1 application, Daily PRN   lisinopril-hydrochlorothiazide (ZESTORETIC) 20-25 MG tablet 1 tablet, Oral, Daily   meloxicam (MOBIC) 15 mg, Daily PRN   omeprazole (PRILOSEC) 40 MG capsule TAKE 1 CAPSULE IN THE MORNING AND AT BEDTIME   pantoprazole (PROTONIX) 40 MG tablet TAKE 1 TABLET BY MOUTH TWICE A DAY   Potassium Gluconate 550 mg, Oral, Daily   traMADol (ULTRAM) 50 mg, 2 times daily PRN   Vitamin D3 2,000 Units, Oral, Daily    REVIEW OF SYSTEMS: Review of Systems  Constitutional: Negative for chills and fever.  HENT:  Negative for hoarse voice and nosebleeds.   Eyes:  Negative for discharge, double vision and pain.  Cardiovascular:  Positive for chest pain. Negative for claudication, dyspnea on exertion, leg swelling, near-syncope, orthopnea, palpitations, paroxysmal nocturnal dyspnea and syncope.  Respiratory:  Positive for shortness of breath. Negative for hemoptysis.   Musculoskeletal:  Negative for muscle cramps and myalgias.  Gastrointestinal:  Negative for abdominal pain, constipation, diarrhea, hematemesis, hematochezia, melena, nausea and vomiting.  Neurological:  Negative for dizziness and light-headedness.   PHYSICAL EXAM: Vitals with BMI 10/14/2020 10/14/2020 10/14/2020  Height - - -  Weight - - -  BMI - - -  Systolic 0000000 [id number]  Diastolic 64 67 57  Pulse 93 85 88     Intake/Output Summary (Last 24 hours) at 10/14/2020 2255 Last data filed at 10/14/2020 2101 Gross per 24 hour  Intake 240 ml  Output --  Net 240 ml    Net IO Since Admission: 240 mL [10/14/20 2255]  CONSTITUTIONAL: Age-appropriate, hemodynamically stable, well-developed and well-nourished. No acute distress.  SKIN: Skin is warm and dry. No rash noted. No cyanosis. No pallor. No jaundice HEAD: Normocephalic and atraumatic.  EYES: No scleral icterus MOUTH/THROAT: Moist oral membranes.  NECK: No JVD present. No thyromegaly noted.  Bilateral carotid bruits  LYMPHATIC: No visible  cervical adenopathy.  CHEST Normal respiratory effort. No intercostal retractions  LUNGS: Clear to auscultation bilaterally.  No stridor. No wheezes. No rales.  CARDIOVASCULAR: Regular, positive S1-S2, no murmurs rubs or gallops appreciated. ABDOMINAL: Obese, soft, nontender, nondistended, positive bowel sounds in all 4 quadrants, no apparent ascites.  EXTREMITIES: No peripheral edema, 2+ dorsalis pedis and posterior tibial pulses, warm to touch. HEMATOLOGIC: No significant bruising NEUROLOGIC: Oriented to person, place, and time. Nonfocal. Normal muscle tone.  PSYCHIATRIC: Normal mood and affect. Normal behavior. Cooperative  RADIOLOGY: CT Angio Chest PE W/Cm &/Or Wo Cm  Result Date: 10/13/2020 CLINICAL DATA:  Pulmonary embolism, tachycardia, chest pain EXAM: CT ANGIOGRAPHY CHEST WITH CONTRAST TECHNIQUE: Multidetector CT imaging of the chest was performed using the standard protocol during bolus administration of intravenous contrast. Multiplanar CT image reconstructions and MIPs were obtained to evaluate the vascular anatomy. CONTRAST:  174m OMNIPAQUE IOHEXOL 350 MG/ML SOLN COMPARISON:  None. FINDINGS: Cardiovascular: There is adequate opacification of the pulmonary arterial tree. No intraluminal filling defect identified to suggest acute pulmonary embolism. The central pulmonary arteries are of normal caliber. Mild global cardiomegaly. No pericardial effusion. No significant coronary artery calcification. The thoracic aorta is of normal caliber. Mild atherosclerotic calcification within the thoracic aorta. Mediastinum/Nodes:  No enlarged mediastinal, hilar, or axillary lymph nodes. Thyroid gland, trachea, and esophagus demonstrate no significant findings. Lungs/Pleura: Mild biapical paraseptal emphysema. Mild bibasilar atelectasis. Minimal scarring within the medial right middle lobe with associated cylindrical bronchiectasis, likely post infectious or post inflammatory in nature. No superimposed  focal pulmonary infiltrate. No pneumothorax or pleural effusion. No central obstructing lesion. Upper Abdomen: Probable left adrenal adenoma is incompletely evaluated on this examination. No acute abnormality within the visualized upper abdomen. Musculoskeletal: No acute bone abnormality. No lytic or blastic bone lesion identified. Review of the MIP images confirms the above findings. IMPRESSION: No acute pulmonary embolism. No acute intrathoracic pathology identified. Mild global cardiomegaly. Mild biapical paraseptal emphysema. Probable left adrenal adenoma, incompletely evaluated on this examination. If indicated, this could be better assessed with dedicated CT or MRI examination. Aortic Atherosclerosis (ICD10-I70.0) and Emphysema (ICD10-J43.9). Electronically Signed   By: Fidela Salisbury MD   On: 10/13/2020 23:29   ECHOCARDIOGRAM COMPLETE  Result Date: 10/14/2020    ECHOCARDIOGRAM REPORT   Patient Name:   Carmen Morgan Date of Exam: 10/14/2020 Medical Rec #:  [id number]      Height:       61.0 in Accession #:    [id number]     Weight:       182.0 lb Date of Birth:  1956-01-03      BSA:          1.815 m Patient Age:    2 years       BP:           133/47 mmHg Patient Gender: F              HR:           85 bpm. Exam Location:  [facility] Procedure: 2D Echo, Cardiac Doppler and Color Doppler Indications:    R07.9* Chest pain, unspecified  History:        Patient has no prior history of Echocardiogram examinations.                 Risk Factors:Hypertension and GERD.  Sonographer:    Tiffany Dance Referring Phys: [id number] COURAGE EMOKPAE  Sonographer Comments: Technically difficult study due to poor echo windows. IMPRESSIONS  1. Left ventricular ejection fraction, by estimation, is 65 to 70%. The left ventricle has normal function. The left ventricle has no regional wall motion abnormalities. There is mild left ventricular hypertrophy. Left ventricular diastolic parameters were normal.  2. Right ventricular systolic  function is normal. The right ventricular size is normal. Tricuspid regurgitation signal is inadequate for assessing PA pressure.  3. The mitral valve is grossly normal. Trivial mitral valve regurgitation. No evidence of mitral stenosis.  4. The aortic valve was not well visualized. Aortic valve regurgitation is trivial. No aortic stenosis is present.  5. The inferior vena cava is normal in size with <50% respiratory variability, suggesting right atrial pressure of 8 mmHg. FINDINGS  Left Ventricle: Left ventricular ejection fraction, by estimation, is 65 to 70%. The left ventricle has normal function. The left ventricle has no regional wall motion abnormalities. The left ventricular internal cavity size was normal in size. There is  mild left ventricular hypertrophy. Left ventricular diastolic parameters were normal. Right Ventricle: The right ventricular size is normal. No increase in right ventricular wall thickness. Right ventricular systolic function is normal. Tricuspid regurgitation signal is inadequate for assessing PA pressure. Left Atrium: Left atrial size was normal in size. Right Atrium: Right atrial size was  normal in size. Pericardium: There is no evidence of pericardial effusion. Presence of pericardial fat pad. Mitral Valve: The mitral valve is grossly normal. Mild mitral annular calcification. Trivial mitral valve regurgitation. No evidence of mitral valve stenosis. Tricuspid Valve: The tricuspid valve is not well visualized. Tricuspid valve regurgitation is not demonstrated. No evidence of tricuspid stenosis. Aortic Valve: The aortic valve was not well visualized. Aortic valve regurgitation is trivial. No aortic stenosis is present. Pulmonic Valve: The pulmonic valve was not well visualized. Pulmonic valve regurgitation is not visualized. No evidence of pulmonic stenosis. Aorta: The aortic root is normal in size and structure. Venous: The inferior vena cava is normal in size with less than 50%  respiratory variability, suggesting right atrial pressure of 8 mmHg. IAS/Shunts: No atrial level shunt detected by color flow Doppler.  LEFT VENTRICLE PLAX 2D LVIDd:         3.60 cm  Diastology LVIDs:         2.36 cm  LV e' medial:    11.10 cm/s LV PW:         1.13 cm  LV E/e' medial:  7.6 LV IVS:        1.06 cm  LV e' lateral:   8.94 cm/s LVOT diam:     1.70 cm  LV E/e' lateral: 9.4 LV SV:         48 LV SV Index:   26 LVOT Area:     2.27 cm  RIGHT VENTRICLE RV Basal diam:  2.46 cm RV S prime:     14.50 cm/s TAPSE (M-mode): 1.9 cm LEFT ATRIUM             Index       RIGHT ATRIUM           Index LA diam:        3.50 cm 1.93 cm/m  RA Area:     11.40 cm LA Vol (A2C):   29.3 ml 16.15 ml/m RA Volume:   22.90 ml  12.62 ml/m LA Vol (A4C):   33.9 ml 18.68 ml/m LA Biplane Vol: 30.9 ml 17.03 ml/m  AORTIC VALVE LVOT Vmax:   96.95 cm/s LVOT Vmean:  65.150 cm/s LVOT VTI:    0.212 m  AORTA Ao Root diam: 2.90 cm Ao Asc diam:  3.10 cm MITRAL VALVE MV Area (PHT): 3.77 cm    SHUNTS MV Decel Time: 201 msec    Systemic VTI:  0.21 m MV E velocity: 84.10 cm/s  Systemic Diam: 1.70 cm MV A velocity: 98.70 cm/s MV E/A ratio:  0.85 Cherlynn Kaiser MD Electronically signed by Cherlynn Kaiser MD Signature Date/Time: 10/14/2020/1:58:58 PM    Final     LABORATORY DATA: Lab Results  Component Value Date   WBC 8.1 10/14/2020   HGB 12.6 10/14/2020   HCT 39.8 10/14/2020   MCV 84.0 10/14/2020   PLT 276 10/14/2020    Recent Labs  Lab 10/14/20 0300  NA 137  K 3.8  CL 105  CO2 24  BUN 17  CREATININE 0.62  CALCIUM 9.7  PROT 7.4  BILITOT 0.4  ALKPHOS 85  ALT 20  AST 33  GLUCOSE 123*    Lipid Panel  No results found for: CHOL, HDL, LDLCALC, LDLDIRECT, TRIG, CHOLHDL  BNP (last 3 results) No results for input(s): BNP in the last 8760 hours.  HEMOGLOBIN A1C No results found for: HGBA1C, MPG  Cardiac Panel (last 3 results) Recent Labs    10/13/20 2336 10/14/20 0300  10/14/20 0653  TROPONINIHS 39* 29* 20*      TSH No results for input(s): TSH in the last 8760 hours.   CARDIAC DATABASE: EKG: 10/13/2020: Sinus tachycardia, 102 bpm, left bundle branch block, ST-T changes in the inferolateral leads, without underlying injury pattern.  No prior EKGs available for comparison.  10/14/2020: Normal sinus rhythm, 81 bpm, old anteroseptal infarct, minimal ST-T changes, without underlying injury pattern.  Compared to prior EKG absence of left bundle branch block.  Echocardiogram: 10/14/2020 1. Left ventricular ejection fraction, by estimation, is 65 to 70%. The  left ventricle has normal function. The left ventricle has no regional  wall motion abnormalities. There is mild left ventricular hypertrophy.  Left ventricular diastolic parameters  were normal.   2. Right ventricular systolic function is normal. The right ventricular  size is normal. Tricuspid regurgitation signal is inadequate for assessing  PA pressure.   3. The mitral valve is grossly normal. Trivial mitral valve  regurgitation. No evidence of mitral stenosis.   4. The aortic valve was not well visualized. Aortic valve regurgitation  is trivial. No aortic stenosis is present.   5. The inferior vena cava is normal in size with <50% respiratory  variability, suggesting right atrial pressure of 8 mmHg.   IMPRESSION & RECOMMENDATIONS: Carmen Morgan is a 65 y.o. Caucasian female whose past medical history and cardiovascular risk factors include: History of COVID-19 infection (January 2020 and July 2022), hypertension, on hormone replacement therapy, former smoker, postmenopausal female, advanced age, obesity due to excess calories..  Primary Diagnosis: Non-STEMI Patient started experiencing typical cardiac discomfort evening of 10/13/2020, mild elevation in troponin, EKG findings of intermittent left bundle branch block and ST-T changes in inferolateral leads. Currently on IV heparin drip.  Will discontinue after 48 hours. Currently is  chest pain-free since 3 AM. Echocardiogram noted preserved LVEF without any significant valvular heart disease or regional wall motion abnormalities Discussed undergoing ischemic evaluation prior to discharge with either coronary CTA versus left heart catheterization.  Risks, benefits, limitations of the diagnostic testing discussed and patient would like to proceed with coronary CTA. Start Lopressor 50 mg p.o. twice daily to help control the ventricular rate. Will check a BMP in the morning.   N.p.o. after midnight. Check fasting lipid profile. Check hemoglobin A1c Check BNP  Elevated troponin: See plan above.  Benign essential hypertension: Start amlodipine 5 mg p.o. every afternoon. Start Lopressor. Will continue to monitor  Bilateral carotid artery bruits: We will check a carotid duplex.  Recent history of COVID-19 infection: Monitor for now.  Intermittent left bundle branch block: Continue to monitor currently on telemetry.  Currently on hormonal placement therapy: We will defer management to primary team  Former smoker: Educated on the importance of continued smoking cessation.   Will defer the management of her other chronic comorbid conditions to primary team.  Total encounter time 87 minutes. *Total Encounter Time as defined by the Centers for Medicare and Medicaid Services includes, in addition to the face-to-face time of a patient visit (documented in the note above) non-face-to-face time: obtaining and reviewing outside history, ordering and reviewing medications, tests or procedures, care coordination (communications with other health care professionals or caregivers) and documentation in the medical record.  Patient's questions and concerns were addressed to her satisfaction. She voices understanding of the instructions provided during this encounter.   This note was created using a voice recognition software as a result there may be grammatical errors inadvertently  enclosed that do not  reflect the nature of this encounter. Every attempt is made to correct such errors.  Mechele Claude [facility]  Pager: [phone] Office: [phone] 10/14/2020, 10:55 PM

## 2020-10-14 NOTE — Progress Notes (Signed)
ANTICOAGULATION CONSULT NOTE -   Pharmacy Consult for Heparin Indication: chest pain/ACS  Allergies  Allergen Reactions   Penicillins Hives    Did it involve swelling of the face/tongue/throat, SOB, or low BP? No Did it involve sudden or severe rash/hives, skin peeling, or any reaction on the inside of your mouth or nose? No Did you need to seek medical attention at a hospital or doctor's office? No When did it last happen?      childhood allergy  If all above answers are "NO", may proceed with cephalosporin use.    Vitamin B12 Nausea And Vomiting   Codeine Palpitations    Irregular Heart Rate    Patient Measurements: Height: '5\' 1"'$  (154.9 cm) Weight: 82.6 kg (182 lb) IBW/kg (Calculated) : 47.8 Heparin Dosing Weight: 70 kg  Vital Signs: Temp: 97.9 F (36.6 C) (07/31 1532) Temp Source: Oral (07/31 1532) BP: 115/67 (07/31 1532) Pulse Rate: 85 (07/31 1532)  Labs: Recent Labs    10/13/20 2200 10/13/20 2336 10/14/20 0300 10/14/20 0653 10/14/20 1702  HGB 13.1  --  12.6  --   --   HCT 41.0  --  39.8  --   --   PLT 317  --  276  --   --   APTT  --   --  30  --   --   LABPROT 12.1  --  12.4  --   --   INR 0.9  --  0.9  --   --   HEPARINUNFRC  --   --   --  0.19* 0.26*  CREATININE 0.75  --  0.62  --   --   TROPONINIHS 31* 39* 29* 20*  --      Estimated Creatinine Clearance: 67.4 mL/min (by C-G formula based on SCr of 0.62 mg/dL).   Medical History: Past Medical History:  Diagnosis Date   GERD (gastroesophageal reflux disease)    Hypertension    Seasonal allergies     Medications:  No current facility-administered medications on file prior to encounter.   Current Outpatient Medications on File Prior to Encounter  Medication Sig Dispense Refill   acetaminophen (TYLENOL) 500 MG tablet Take 1,000 mg by mouth every 6 (six) hours as needed for moderate pain or headache.     Cholecalciferol (VITAMIN D3) 50 MCG (2000 UT) TABS Take 2,000 Units by mouth daily.       estradiol (ESTRACE) 0.5 MG tablet Take 0.5 mg by mouth at bedtime.      fexofenadine (ALLEGRA) 180 MG tablet Take 180 mg by mouth daily.     fluticasone (FLONASE) 50 MCG/ACT nasal spray Place 2 sprays into both nostrils daily.      lisinopril-hydrochlorothiazide (ZESTORETIC) 20-25 MG tablet Take 1 tablet by mouth daily.     pantoprazole (PROTONIX) 40 MG tablet TAKE 1 TABLET BY MOUTH TWICE A DAY 60 tablet 3   Potassium Gluconate 550 (90 K) MG TABS Take 550 mg by mouth daily.     cyclobenzaprine (FLEXERIL) 10 MG tablet Take 10 mg by mouth 3 (three) times daily as needed for muscle spasms. (Patient not taking: Reported on 10/14/2020)     ketoconazole (NIZORAL) 2 % shampoo Apply 1 application topically daily as needed for psychosis. (Patient not taking: Reported on 10/14/2020)     meloxicam (MOBIC) 15 MG tablet Take 15 mg by mouth daily as needed for pain.  (Patient not taking: Reported on 10/14/2020)     omeprazole (PRILOSEC) 40 MG capsule TAKE 1  CAPSULE IN THE MORNING AND AT BEDTIME (Patient not taking: Reported on 10/14/2020) 60 capsule 5   traMADol (ULTRAM) 50 MG tablet Take 50 mg by mouth 2 (two) times daily as needed for pain. (Patient not taking: Reported on 10/14/2020)     [DISCONTINUED] aspirin EC 81 MG tablet Take 81 mg by mouth daily.     [DISCONTINUED] Melatonin 10 MG CAPS Take 10 mg by mouth at bedtime.      [DISCONTINUED] OVER THE COUNTER MEDICATION Take 1 capsule by mouth daily. Vari-Gone otc supplement     [DISCONTINUED] solifenacin (VESICARE) 10 MG tablet Take 10 mg by mouth daily.        Assessment: 65 y.o. female with chest pain/possible NSTEMI.  Patient is not on anticoagulation prior to admission.  Plan to transfer to [facility].  Heparin level below goal on 1150 units/hr.  No overt bleeding or complications noted.  Goal of Therapy:  Heparin level 0.3-0.7 units/ml Monitor platelets by anticoagulation protocol: Yes   Plan:  Increase IV heparin to 1250 units/hr. Recheck heparin  level in 8 hrs. Daily heparin level and CBC.  [place] Crane, Roylene Reason, BCCP Clinical Pharmacist  10/14/2020 5:53 PM   [facility] pharmacy phone numbers are listed on [email or web address]

## 2020-10-14 NOTE — Progress Notes (Addendum)
Patient seen and evaluated, chart reviewed, please see EMR for updated orders. Please see full H&P dictated by admitting physician Dr Josephine Cables for same date of service.   Brief summary -65 y.o. female with medical history significant for hypertension, GERD and ERT use admitted on 10/14/20 with atypical chest pains, elevated troponins and abnormal EKG   A/p 1)Atypical chest pain--- initial EKG was abnormal, reviewed with cardiologist Dr. Rex Kras, repeat EKGs more reassuring -Cardiologist recommends IV heparin -On palpation patient also have left costochondral area reproducible tenderness -Troponin-- 31>> 39>>29>>20  -Patient reports at least 3 episodes of concerning chest pains over the last several months -Echocardiogram pending -Fasting lipid profile pending -Give Aspirin and Lipitor -Defer to cardiology service if patient needs stress test versus LHC -Patient awaiting transfer to [place] for further cardiology evaluation  2)GERD--continue Protonix  3)HTN--hold lisinopril HCTZ, may use IV labetalol as needed elevated BP  4)ERT--- hold estrogen, CTA chest without PE,   5) recent COVID-19 infection--- patient was diagnosed with COVID-19 infection in January 2021 and again in July 2022 (more than 3 weeks ago) -Largely asymptomatic at this time -Out of isolation window  6)COPD/emphysema----please see CTA chest report -- --largely asymptomatic at this time May use bronchodilators as needed  Disposition:-Transfer to [place] for further cardiology evaluation, patient will stay on hospitalist service with cardiology consulting  -Total care time is about 43 minutes  Patient seen and evaluated, chart reviewed, please see EMR for updated orders. Please see full H&P dictated by admitting physician Dr Josephine Cables for same date of service.   Roxan Hockey, MD

## 2020-10-14 NOTE — Progress Notes (Signed)
Patient seen.  Was admitted earlier today for chest pain.  Currently on IV heparin.  Cardiology evaluation pending.  Was noted to have left forearm hematoma with no signs of infection.  Patient to continue with ice pack/cold compress and elevate arm while in bed.

## 2020-10-14 NOTE — ED Notes (Signed)
ED Provider at bedside. 

## 2020-10-14 NOTE — Progress Notes (Signed)
  Echocardiogram 2D Echocardiogram has been performed.  Carmen Morgan G Paxtyn Wisdom 10/14/2020, 1:42 PM

## 2020-10-14 NOTE — H&P (Signed)
History and Physical  Carmen Morgan [id number] DOB: 08-14-1955 DOA: 10/13/2020  Referring physician: Delora Fuel, MD PCP: Etter Sjogren, FNP  Patient coming from: Home  Chief Complaint: Chest pain  HPI: Carmen Morgan is a 65 y.o. female with medical history significant for hypertension, GERD who presents to the emergency department due to midsternal chest pain which started while eating dinner yesterday (7/30), chest pain was described as chest tightness with impaired shortness of breath, chest pain was also described as "sensation of someone sitting on her chest".  Chest pain was nonradiating, it was slightly alleviated with 1 dose of aspirin 81 mg taken prior to arrival to the ED.  She also took 1 capsule of Benadryl due to thoughts that her symptoms could be due to allergy, but there was no improvement in symptoms.  Patient states that she has had 2 similar episodes which self resolved in recent months.  She states that she had a COVID virus infection about a month ago and though still have cough, she has essentially recovered.  ED Course:  In the emergency department, she was hemodynamically stable except for soft BP at 90/56.  Work-up in the ED showed normal CBC and BMP except for mild hyperglycemia.  Troponin x2-31 > 39.  Influenza A, B, SARS coronavirus 2 was negative. CT angiography chest with contrast showed no acute pulmonary embolism and no acute intrathoracic pathology. 3 baby aspirin (81 mg each) tablets were given, patient was started on IV heparin drip.  Cardiologist on call at [facility] was consulted who recommended admitting patient to [facility] with plan for cardiology to follow-up on patient.  Hospitalist was asked to admit patient for further evaluation and management.  Review of Systems: Constitutional: Negative for chills and fever.  HENT: Negative for ear pain and sore throat.   Eyes: Negative for pain and visual disturbance.  Respiratory: Positive for cough with  production of thick white phlegm, chest tightness and shortness of breath.   Cardiovascular: Negative for chest pain and palpitations.  Gastrointestinal: Negative for abdominal pain and vomiting.  Endocrine: Negative for polyphagia and polyuria.  Genitourinary: Negative for decreased urine volume, dysuria, enuresis Musculoskeletal: Midsternal chest pain.  Negative for arthralgias and back pain.  Skin: Negative for color change and rash.  Allergic/Immunologic: Negative for immunocompromised state.  Neurological: Negative for tremors, syncope, speech difficulty, weakness, light-headedness and headaches.  Hematological: Does not bruise/bleed easily.  All other systems reviewed and are negative   Past Medical History:  Diagnosis Date   GERD (gastroesophageal reflux disease)    Hypertension    Seasonal allergies    Past Surgical History:  Procedure Laterality Date   ABDOMINAL HYSTERECTOMY  2014   BACK SURGERY  2012   COLONOSCOPY N/A 02/16/2019   Procedure: COLONOSCOPY;  Surgeon: Daneil Dolin, MD;  [facility];  Service: Endoscopy;  Laterality: N/A;  2:00   ESOPHAGOGASTRODUODENOSCOPY N/A 01/18/2020   Procedure: ESOPHAGOGASTRODUODENOSCOPY (EGD);  Surgeon: Daneil Dolin, MD;  Location: [facility];  Service: Endoscopy;  Laterality: N/A;  2:45pm   left breast cystectomy  [place] N/A 01/18/2020   Procedure: MALONEY DILATION;  Surgeon: Daneil Dolin, MD;  Location: [facility];  Service: Endoscopy;  Laterality: N/A;   VEIN SURGERY  2013    Social History:  reports that she has quit smoking. She has never used smokeless tobacco. She reports that she does not drink alcohol and does not use drugs.   Allergies  Allergen Reactions   Penicillins Hives    Did it involve swelling of the face/tongue/throat, SOB, or low BP? No Did it involve sudden or severe rash/hives, skin peeling, or any reaction on the inside of your mouth or nose? No Did you need to seek  medical attention at a hospital or doctor's office? No When did it last happen?      childhood allergy  If all above answers are "NO", may proceed with cephalosporin use.    Vitamin B12 Nausea And Vomiting   Codeine Palpitations    Irregular Heart Rate    Family History  Problem Relation Age of Onset   Colon cancer Neg Hx      Prior to Admission medications   Medication Sig Start Date End Date Taking? Authorizing Provider  acetaminophen (TYLENOL) 500 MG tablet Take 1,000 mg by mouth every 6 (six) hours as needed for moderate pain or headache.    [provider]  aspirin EC 81 MG tablet Take 81 mg by mouth daily.    [provider]  Cholecalciferol (VITAMIN D3) 50 MCG (2000 UT) TABS Take 2,000 Units by mouth daily.     [provider]  cyclobenzaprine (FLEXERIL) 10 MG tablet Take 10 mg by mouth 3 (three) times daily as needed for muscle spasms.    [provider]  estradiol (ESTRACE) 0.5 MG tablet Take 0.5 mg by mouth at bedtime.     [provider]  fexofenadine (ALLEGRA) 180 MG tablet Take 180 mg by mouth daily.    [provider]  fluticasone (FLONASE) 50 MCG/ACT nasal spray Place 2 sprays into both nostrils daily.     [provider]  ketoconazole (NIZORAL) 2 % shampoo Apply 1 application topically daily as needed for psychosis. 01/03/20   [provider]  lisinopril-hydrochlorothiazide (ZESTORETIC) 20-25 MG tablet Take 1 tablet by mouth daily.    [provider]  Melatonin 10 MG CAPS Take 10 mg by mouth at bedtime.     [provider]  meloxicam (MOBIC) 15 MG tablet Take 15 mg by mouth daily as needed for pain.  11/27/19   [provider]  omeprazole (PRILOSEC) 40 MG capsule TAKE 1 CAPSULE IN THE MORNING AND AT BEDTIME 10/11/20   Annitta Needs, NP  OVER THE COUNTER MEDICATION Take 1 capsule by mouth daily. Vari-Gone otc supplement    [provider]  pantoprazole (PROTONIX) 40 MG  tablet TAKE 1 TABLET BY MOUTH TWICE A DAY 08/07/20   Annitta Needs, NP  Potassium Gluconate 550 (90 K) MG TABS Take 550 mg by mouth daily.    [provider]  solifenacin (VESICARE) 10 MG tablet Take 10 mg by mouth daily.     [provider]  traMADol (ULTRAM) 50 MG tablet Take 50 mg by mouth 2 (two) times daily as needed for pain. 01/06/20   [provider]    Physical Exam: BP 113/84   Pulse 87   Temp 98 F (36.7 C) (Oral)   Resp 17   Ht '5\' 1"'$  (1.549 m)   Wt 82.6 kg   SpO2 95%   BMI 34.39 kg/m   General: 65 y.o. year-old female well developed well nourished in no acute distress.  Alert and oriented x3. HEENT: NCAT, EOMI Neck: Supple, trachea medial Cardiovascular: Regular rate and rhythm with no rubs or gallops.  No thyromegaly or JVD noted.  2/4 pulses in all 4 extremities. Respiratory: Clear to auscultation with no wheezes or  rales. Good inspiratory effort. Abdomen: Soft, nontender nondistended with normal bowel sounds x4 quadrants. Muskuloskeletal: Tender to palpation of midsternal area.  No cyanosis, clubbing or edema noted bilaterally Neuro: CN II-XII intact, strength 5/5 x 4, sensation, reflexes intact Skin: No ulcerative lesions noted or rashes Psychiatry: Judgement and insight appear normal. Mood is appropriate for condition and setting          Labs on Admission:  Basic Metabolic Panel: Recent Labs  Lab 10/13/20 2200  NA 136  K 3.5  CL 102  CO2 25  GLUCOSE 115*  BUN 23  CREATININE 0.75  CALCIUM 10.2   Liver Function Tests: No results for input(s): AST, ALT, ALKPHOS, BILITOT, PROT, ALBUMIN in the last 168 hours. No results for input(s): LIPASE, AMYLASE in the last 168 hours. No results for input(s): AMMONIA in the last 168 hours. CBC: Recent Labs  Lab 10/13/20 2200 10/14/20 0300  WBC 10.0 8.1  NEUTROABS 7.3  --   HGB 13.1 12.6  HCT 41.0 39.8  MCV 84.7 84.0  PLT 317 276   Cardiac Enzymes: No results for input(s):  CKTOTAL, CKMB, CKMBINDEX, TROPONINI in the last 168 hours.  BNP (last 3 results) No results for input(s): BNP in the last 8760 hours.  ProBNP (last 3 results) No results for input(s): PROBNP in the last 8760 hours.  CBG: No results for input(s): GLUCAP in the last 168 hours.  Radiological Exams on Admission: CT Angio Chest PE W/Cm &/Or Wo Cm  Result Date: 10/13/2020 CLINICAL DATA:  Pulmonary embolism, tachycardia, chest pain EXAM: CT ANGIOGRAPHY CHEST WITH CONTRAST TECHNIQUE: Multidetector CT imaging of the chest was performed using the standard protocol during bolus administration of intravenous contrast. Multiplanar CT image reconstructions and MIPs were obtained to evaluate the vascular anatomy. CONTRAST:  130m OMNIPAQUE IOHEXOL 350 MG/ML SOLN COMPARISON:  None. FINDINGS: Cardiovascular: There is adequate opacification of the pulmonary arterial tree. No intraluminal filling defect identified to suggest acute pulmonary embolism. The central pulmonary arteries are of normal caliber. Mild global cardiomegaly. No pericardial effusion. No significant coronary artery calcification. The thoracic aorta is of normal caliber. Mild atherosclerotic calcification within the thoracic aorta. Mediastinum/Nodes: No enlarged mediastinal, hilar, or axillary lymph nodes. Thyroid gland, trachea, and esophagus demonstrate no significant findings. Lungs/Pleura: Mild biapical paraseptal emphysema. Mild bibasilar atelectasis. Minimal scarring within the medial right middle lobe with associated cylindrical bronchiectasis, likely post infectious or post inflammatory in nature. No superimposed focal pulmonary infiltrate. No pneumothorax or pleural effusion. No central obstructing lesion. Upper Abdomen: Probable left adrenal adenoma is incompletely evaluated on this examination. No acute abnormality within the visualized upper abdomen. Musculoskeletal: No acute bone abnormality. No lytic or blastic bone lesion identified.  Review of the MIP images confirms the above findings. IMPRESSION: No acute pulmonary embolism. No acute intrathoracic pathology identified. Mild global cardiomegaly. Mild biapical paraseptal emphysema. Probable left adrenal adenoma, incompletely evaluated on this examination. If indicated, this could be better assessed with dedicated CT or MRI examination. Aortic Atherosclerosis (ICD10-I70.0) and Emphysema (ICD10-J43.9). Electronically Signed   By: AFidela SalisburyMD   On: 10/13/2020 23:29    EKG: I independently viewed the EKG done and my findings are as followed: Sinus tachycardia with rate of 102 bpm and QTc of 493 ms  Assessment/Plan Present on Admission:  GERD (gastroesophageal reflux disease)  Principal Problem:   Atypical chest pain Active Problems:   GERD (gastroesophageal reflux disease)   Elevated troponin   Prolonged QT interval   Obesity (BMI 30-39.9)  Essential hypertension  Atypical chest pain Elevated troponin possibly due to type II demand ischemia rule out NSTEMI Patient complained of ongoing nonradiating chest pressure like chest pain which was also associated with soreness on palpation. Troponin x2-31 > 39; continue to trend troponin Patient took 81 mg of aspirin PTA, 243 mg was given in the ED EKG personally reviewed showed sinus tachycardia with rate of 102 bpm and QTc of 493 ms She was started on IV heparin drip Nitroglycerin could not be given at this time due to soft BP Cardiology on-call at [facility] was consulted and recommended admitting patient to [facility] with plan for cardiology to consult on patient on arrival at [facility] per ED physician  Prolonged QTc (493 ms) Avoid QT prolonging drugs Magnesium level will be checked  Essential hypertension BP meds will be held at this time due to soft BP  GERD Continue Protonix  Obesity (BMI 34.39) Patient will be counseled on diet and lifestyle modification when more stable  DVT prophylaxis: Heparin drip  Code  Status: Full code  Family Communication: Husband at bedside (all questions answered to satisfaction)  Disposition Plan:  Patient is from:                        home Anticipated DC to:                   SNF or family members home Anticipated DC date:               2-3 days Anticipated DC barriers:          Patient requires inpatient management due to atypical chest pain requiring further work-up and pending cardiology consult  Consults called: Cardiology  Admission status: Observation    Bernadette Hoit MD [facility]  10/14/2020, 4:19 AM

## 2020-10-14 NOTE — Progress Notes (Signed)
ANTICOAGULATION CONSULT NOTE - Initial Consult  Pharmacy Consult for Heparin Indication: chest pain/ACS  Allergies  Allergen Reactions   Penicillins Hives    Did it involve swelling of the face/tongue/throat, SOB, or low BP? No Did it involve sudden or severe rash/hives, skin peeling, or any reaction on the inside of your mouth or nose? No Did you need to seek medical attention at a hospital or doctor's office? No When did it last happen?      childhood allergy  If all above answers are "NO", may proceed with cephalosporin use.    Vitamin B12 Nausea And Vomiting   Codeine Palpitations    Irregular Heart Rate    Patient Measurements: Height: '5\' 1"'$  (154.9 cm) Weight: 82.6 kg (182 lb) IBW/kg (Calculated) : 47.8 Heparin Dosing Weight: 70 kg  Vital Signs: Temp: 98 F (36.7 C) (07/30 2104) Temp Source: Oral (07/30 2104) BP: 90/56 (07/31 0000) Pulse Rate: 86 (07/31 0000)  Labs: Recent Labs    10/13/20 2200 10/13/20 2336  HGB 13.1  --   HCT 41.0  --   PLT 317  --   LABPROT 12.1  --   INR 0.9  --   CREATININE 0.75  --   TROPONINIHS 31* 39*    Estimated Creatinine Clearance: 67.4 mL/min (by C-G formula based on SCr of 0.75 mg/dL).   Medical History: Past Medical History:  Diagnosis Date   GERD (gastroesophageal reflux disease)    Hypertension    Seasonal allergies     Medications:  No current facility-administered medications on file prior to encounter.   Current Outpatient Medications on File Prior to Encounter  Medication Sig Dispense Refill   acetaminophen (TYLENOL) 500 MG tablet Take 1,000 mg by mouth every 6 (six) hours as needed for moderate pain or headache.     aspirin EC 81 MG tablet Take 81 mg by mouth daily.     Cholecalciferol (VITAMIN D3) 50 MCG (2000 UT) TABS Take 2,000 Units by mouth daily.      cyclobenzaprine (FLEXERIL) 10 MG tablet Take 10 mg by mouth 3 (three) times daily as needed for muscle spasms.     estradiol (ESTRACE) 0.5 MG tablet Take  0.5 mg by mouth at bedtime.      fexofenadine (ALLEGRA) 180 MG tablet Take 180 mg by mouth daily.     fluticasone (FLONASE) 50 MCG/ACT nasal spray Place 2 sprays into both nostrils daily.      ketoconazole (NIZORAL) 2 % shampoo Apply 1 application topically daily as needed for psychosis.     lisinopril-hydrochlorothiazide (ZESTORETIC) 20-25 MG tablet Take 1 tablet by mouth daily.     Melatonin 10 MG CAPS Take 10 mg by mouth at bedtime.      meloxicam (MOBIC) 15 MG tablet Take 15 mg by mouth daily as needed for pain.      omeprazole (PRILOSEC) 40 MG capsule TAKE 1 CAPSULE IN THE MORNING AND AT BEDTIME 60 capsule 5   OVER THE COUNTER MEDICATION Take 1 capsule by mouth daily. Vari-Gone otc supplement     pantoprazole (PROTONIX) 40 MG tablet TAKE 1 TABLET BY MOUTH TWICE A DAY 60 tablet 3   Potassium Gluconate 550 (90 K) MG TABS Take 550 mg by mouth daily.     solifenacin (VESICARE) 10 MG tablet Take 10 mg by mouth daily.      traMADol (ULTRAM) 50 MG tablet Take 50 mg by mouth 2 (two) times daily as needed for pain.  Assessment: 65 y.o. female with chest pain for heparin  Goal of Therapy:  Heparin level 0.3-0.7 units/ml Monitor platelets by anticoagulation protocol: Yes   Plan:  Heparin 4000 units IV bolus, then start heparin 1000 units/hr Check heparin level in 6 hours.   Caryl Pina 10/14/2020,1:09 AM

## 2020-10-15 ENCOUNTER — Observation Stay (HOSPITAL_COMMUNITY): Payer: Medicare Other

## 2020-10-15 ENCOUNTER — Inpatient Hospital Stay (HOSPITAL_COMMUNITY): Payer: Medicare Other

## 2020-10-15 DIAGNOSIS — R0989 Other specified symptoms and signs involving the circulatory and respiratory systems: Secondary | ICD-10-CM

## 2020-10-15 DIAGNOSIS — Z888 Allergy status to other drugs, medicaments and biological substances status: Secondary | ICD-10-CM | POA: Diagnosis not present

## 2020-10-15 DIAGNOSIS — Z6834 Body mass index (BMI) 34.0-34.9, adult: Secondary | ICD-10-CM | POA: Diagnosis not present

## 2020-10-15 DIAGNOSIS — I447 Left bundle-branch block, unspecified: Secondary | ICD-10-CM | POA: Diagnosis present

## 2020-10-15 DIAGNOSIS — I209 Angina pectoris, unspecified: Secondary | ICD-10-CM | POA: Diagnosis present

## 2020-10-15 DIAGNOSIS — R079 Chest pain, unspecified: Secondary | ICD-10-CM | POA: Diagnosis present

## 2020-10-15 DIAGNOSIS — Z87891 Personal history of nicotine dependence: Secondary | ICD-10-CM | POA: Diagnosis not present

## 2020-10-15 DIAGNOSIS — R739 Hyperglycemia, unspecified: Secondary | ICD-10-CM | POA: Diagnosis present

## 2020-10-15 DIAGNOSIS — Z79899 Other long term (current) drug therapy: Secondary | ICD-10-CM | POA: Diagnosis not present

## 2020-10-15 DIAGNOSIS — Z79890 Hormone replacement therapy (postmenopausal): Secondary | ICD-10-CM | POA: Diagnosis not present

## 2020-10-15 DIAGNOSIS — I2510 Atherosclerotic heart disease of native coronary artery without angina pectoris: Secondary | ICD-10-CM | POA: Diagnosis present

## 2020-10-15 DIAGNOSIS — K219 Gastro-esophageal reflux disease without esophagitis: Secondary | ICD-10-CM | POA: Diagnosis present

## 2020-10-15 DIAGNOSIS — I708 Atherosclerosis of other arteries: Secondary | ICD-10-CM | POA: Diagnosis present

## 2020-10-15 DIAGNOSIS — Z880 Allergy status to penicillin: Secondary | ICD-10-CM | POA: Diagnosis not present

## 2020-10-15 DIAGNOSIS — Z885 Allergy status to narcotic agent status: Secondary | ICD-10-CM | POA: Diagnosis not present

## 2020-10-15 DIAGNOSIS — Z7982 Long term (current) use of aspirin: Secondary | ICD-10-CM | POA: Diagnosis not present

## 2020-10-15 DIAGNOSIS — I10 Essential (primary) hypertension: Secondary | ICD-10-CM | POA: Diagnosis present

## 2020-10-15 DIAGNOSIS — Z90710 Acquired absence of both cervix and uterus: Secondary | ICD-10-CM | POA: Diagnosis not present

## 2020-10-15 DIAGNOSIS — J439 Emphysema, unspecified: Secondary | ICD-10-CM | POA: Diagnosis present

## 2020-10-15 DIAGNOSIS — I214 Non-ST elevation (NSTEMI) myocardial infarction: Secondary | ICD-10-CM | POA: Diagnosis present

## 2020-10-15 DIAGNOSIS — Z20822 Contact with and (suspected) exposure to covid-19: Secondary | ICD-10-CM | POA: Diagnosis present

## 2020-10-15 DIAGNOSIS — I6523 Occlusion and stenosis of bilateral carotid arteries: Secondary | ICD-10-CM | POA: Diagnosis present

## 2020-10-15 DIAGNOSIS — R0789 Other chest pain: Secondary | ICD-10-CM | POA: Diagnosis not present

## 2020-10-15 DIAGNOSIS — Z791 Long term (current) use of non-steroidal anti-inflammatories (NSAID): Secondary | ICD-10-CM | POA: Diagnosis not present

## 2020-10-15 LAB — BASIC METABOLIC PANEL
Anion gap: 9 (ref 5–15)
BUN: 15 mg/dL (ref 8–23)
CO2: 23 mmol/L (ref 22–32)
Calcium: 9.9 mg/dL (ref 8.9–10.3)
Chloride: 106 mmol/L (ref 98–111)
Creatinine, Ser: 0.67 mg/dL (ref 0.44–1.00)
GFR, Estimated: >60 mL/min (ref >60)
Glucose, Bld: 157 mg/dL — ABNORMAL HIGH (ref 70–99)
Potassium: 3.6 mmol/L (ref 3.5–5.1)
Sodium: 138 mmol/L (ref 135–145)

## 2020-10-15 LAB — CBC
HCT: 33.9 % — ABNORMAL LOW (ref 36.0–46.0)
Hemoglobin: 11 g/dL — ABNORMAL LOW (ref 12.0–15.0)
MCH: 27.1 pg (ref 26.0–34.0)
MCHC: 32.4 g/dL (ref 30.0–36.0)
MCV: 83.5 fL (ref 80.0–100.0)
Platelets: 242 10*3/uL (ref 150–400)
RBC: 4.06 MIL/uL (ref 3.87–5.11)
RDW: 14.6 % (ref 11.5–15.5)
WBC: 7.2 10*3/uL (ref 4.0–10.5)
nRBC: 0 % (ref 0.0–0.2)

## 2020-10-15 LAB — LIPID PANEL
Cholesterol: 138 mg/dL (ref 0–200)
HDL: 43 mg/dL (ref >40)
LDL Cholesterol: 59 mg/dL (ref 0–99)
Total CHOL/HDL Ratio: 3.2 RATIO
Triglycerides: 180 mg/dL — ABNORMAL HIGH (ref <150)
VLDL: 36 mg/dL (ref 0–40)

## 2020-10-15 LAB — HEPARIN LEVEL (UNFRACTIONATED)
Heparin Unfractionated: 0.42 IU/mL (ref 0.30–0.70)
Heparin Unfractionated: 0.52 IU/mL (ref 0.30–0.70)

## 2020-10-15 LAB — BRAIN NATRIURETIC PEPTIDE: B Natriuretic Peptide: 28.3 pg/mL (ref 0.0–100.0)

## 2020-10-15 LAB — HEMOGLOBIN A1C
Hgb A1c MFr Bld: 6.2 % — ABNORMAL HIGH (ref 4.8–5.6)
Mean Plasma Glucose: 131.24 mg/dL

## 2020-10-15 IMAGING — CT CT HEART MORP W/ CTA COR W/ SCORE W/ CA W/CM &/OR W/O CM
4 of 7 series · 8 of 20 positions shown, 9 images · IV contrast (APPLIED)
Comparison: None.
COMPARISON: None.

Addendum:
EXAM:
OVER-READ INTERPRETATION  CT CHEST

The following report is an over-read performed by radiologist Dr.
BARFHOLI [REDACTED] on [DATE]. This over-read
does not include interpretation of cardiac or coronary anatomy or
pathology. The coronary CTA interpretation by the cardiologist is
attached.
HISTORY: Chest pain, nonspecific
Cardiac/Coronary  CT
TECHNIQUE: The patient was scanned on a Siemens Force scanner.
PROTOCOL: A 120 kV prospective scan was triggered in the descending thoracic
aorta at 111 HU's. Axial non-contrast 3 mm slices were carried out
through the heart. The data set was analyzed on a dedicated work
station and scored using the Agatson method. Gantry rotation speed
was 250 msecs and collimation was .6 mm. No IV beta blockade but
mg of sl NTG was given. The 3D data set was reconstructed in 5%
intervals of the 67-82 % of the R-R cycle. Diastolic phases were
analyzed on a dedicated work station using MPR, MIP and VRT modes.
The patient received 95mL OMNIPAQUE IOHEXOL 350 MG/ML SOLN of
contrast.

[Series 6: best diast 73 % · axial · 0.39mm/px · z∈[-217,-177]mm · 2 of 298 slices shown, 3 images]
[im 100/298  vessel]
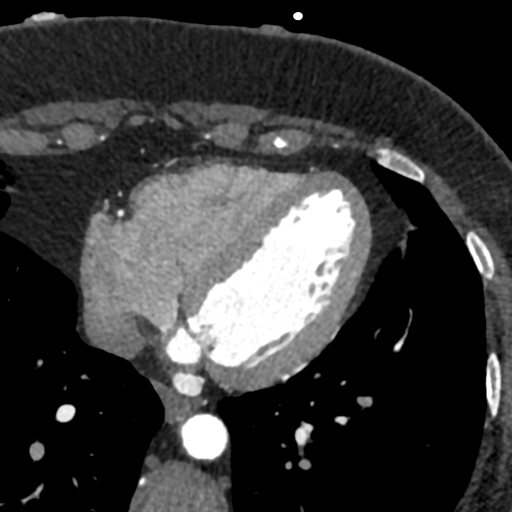
[im 100/298  lung]
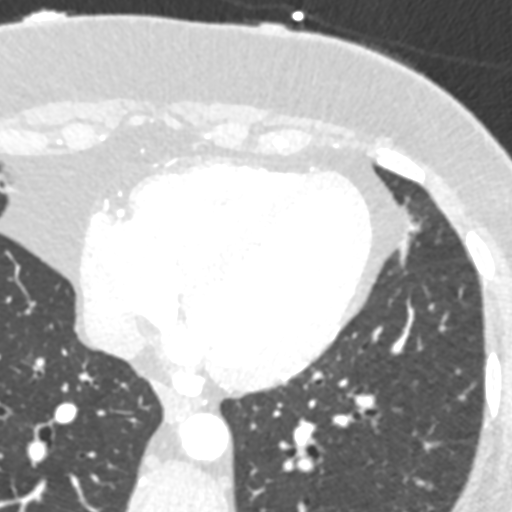
[im 199/298  vessel]
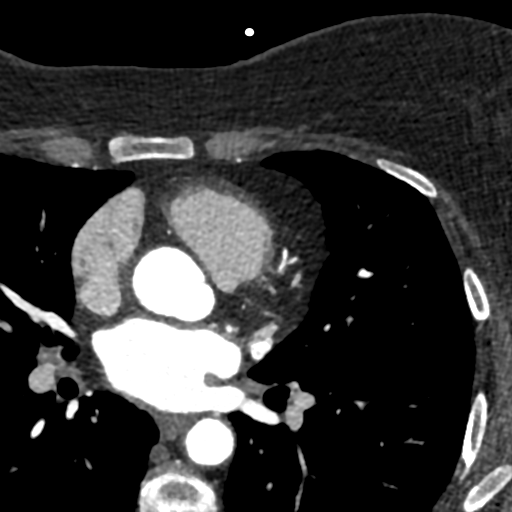

[Series 7: best syst 42 % · axial · 0.39mm/px · z∈[-217,-177]mm · 2 of 298 slices shown]
[im 100/298  vessel]
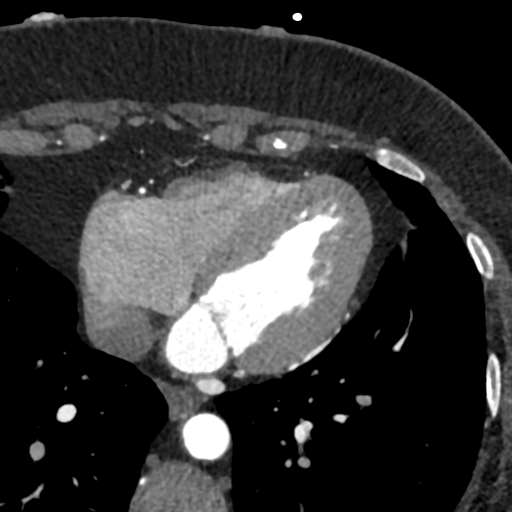
[im 199/298  vessel]
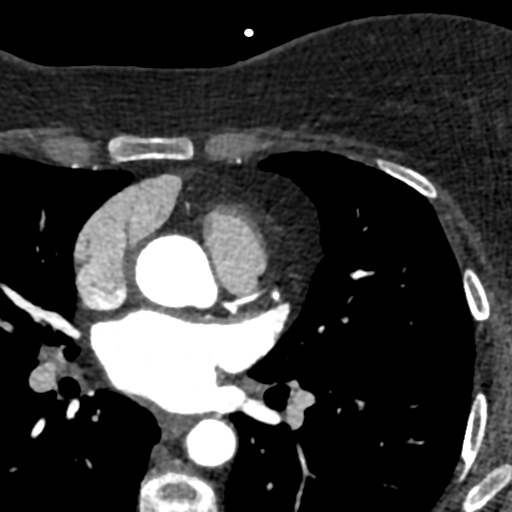

[Series 8: ts diast sharp 73 % · axial · 0.39mm/px · z∈[-217,-177]mm · 2 of 298 slices shown]
[im 100/298  lung]
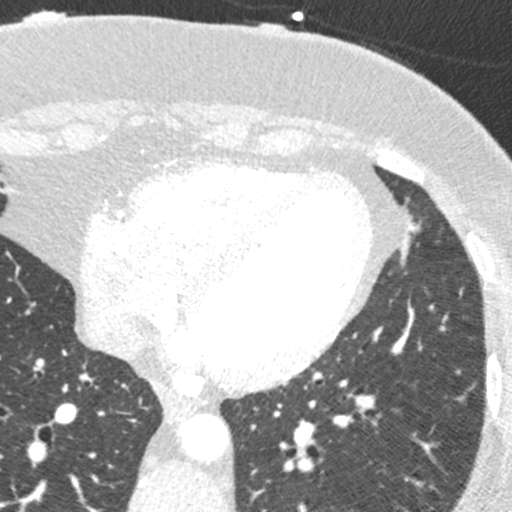
[im 199/298  lung]
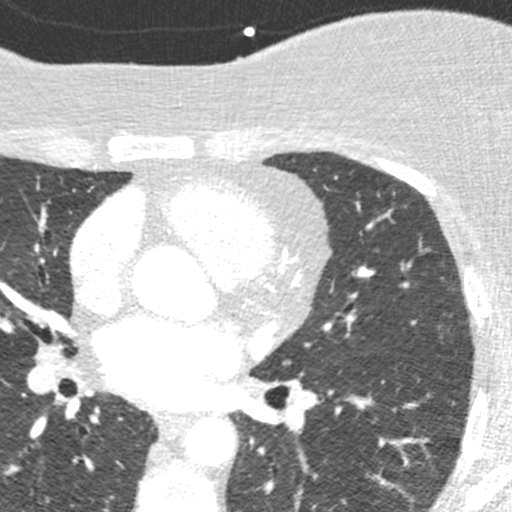

[Series 9: ts syst sharp 42 % · axial · 0.39mm/px · z∈[-217,-177]mm · 2 of 298 slices shown]
[im 100/298  lung]
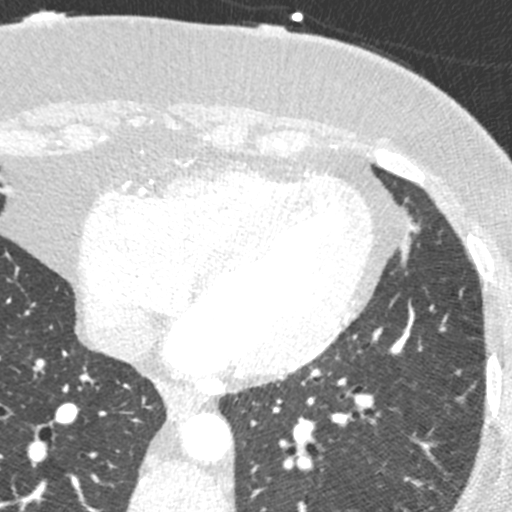
[im 199/298  lung]
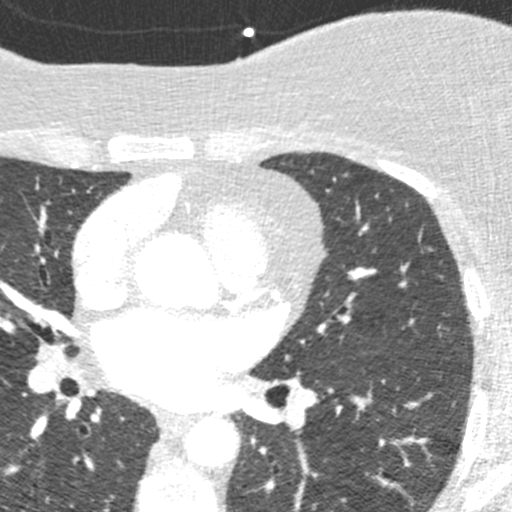

[8 of 20 positions shown; findings below may reference images not displayed]

FINDINGS: Vascular: Heart is normal size. Aorta normal caliber. Scattered
calcifications in the descending thoracic aorta.

Mediastinum/Nodes: No adenopathy

Lungs/Pleura: No confluent opacities or effusions.

Upper Abdomen: Imaging into the upper abdomen demonstrates no acute
findings.

Musculoskeletal: Chest wall soft tissues are unremarkable. No acute
bony abnormality.
IMPRESSION: No acute extra cardiac abnormality.

Descending aortic atherosclerosis.
FINDINGS: Image quality: Average.

Artifact: Limited.

Coronary artery calcification score:

Left main: 0

Left anterior descending artery: 0

Left circumflex artery:

Right coronary artery: 0

Total coronary calcium score of 14.1, places the patient at the 60th
percentile for age and sex matched control.

Coronary arteries: Normal coronary origins.  Right dominance.

Left Main Coronary Artery: The left main is a normal caliber vessel
with a normal take off from the left coronary cusp that bifurcates
into a LAD and LCX. There is no plaque or stenosis.

Left Anterior Descending Coronary Artery: Normal caliber vessel,
reaches the apex, gives off 1 patent diagonal branches. Minimal
stenosis (0-24%) due to noncalcified plaque in the proximal LAD. Mid
to distal LAD is patent with evidence of myocardial bridge within
the mid LAD.

Left Circumflex Artery: Normal caliber vessel, non-dominant, travels
within the atrioventricular groove, gives off 3 patent obtuse
marginal branches. The LCX is overall patent with mild stenosis
(25-49%) within the mid LCx due to mixed plaque.

Right Coronary Artery: The RCA is dominant with normal take off from
the right coronary cusp. The RCA terminates as a PDA and right
posterolateral branch. Minimal stenosis (0-24%) due to mixed plaque
in proximal RCA. Mid to Distal RCA is overall patent without
evidence of plaque or stenosis.

Left Atrium: Grossly normal in size with no left atrial appendage
filling defect.

Left Ventricle: Grossly normal in size. There are no stigmata of
prior infarction. There is no abnormal filling defect.

Pulmonary arteries: Normal in size without proximal filling defect.

Pulmonary veins: Normal pulmonary venous drainage.

Aorta: Normal size, 31 mm at the mid ascending aorta (level of the
right PA) measured double oblique. Aortic atherosclerosis within
descending aorta. No dissection.

Pericardium: Normal thickness with no significant effusion or
calcium present.

Cardiac valves: The aortic valve is trileaflet without significant
calcification. The mitral valve is normal structure without
significant calcification.

Extra-cardiac findings: See attached radiology report for
non-cardiac structures.
IMPRESSION: 1. Total coronary calcium score of 14.1. This was 60th percentile
for age and sex matched control.

2. Normal coronary origin with right dominance.

3. CAD-RADS = 2.

Minimal stenosis (0-24%) due to noncalcified plaque in the proximal
LAD.

Mild stenosis (25-49%) within the mid LCx due to mixed plaque.

Minimal stenosis (0-24%) due to mixed plaque in proximal RCA.

4. Aortic atherosclerosis within descending aorta.

RECOMMENDATIONS:

Consider non-atherosclerotic causes of chest pain. Consider
preventive therapy and risk factor modification.

*** End of Addendum ***
EXAM:
OVER-READ INTERPRETATION  CT CHEST

The following report is an over-read performed by radiologist Dr.
BARFHOLI [REDACTED] on [DATE]. This over-read
does not include interpretation of cardiac or coronary anatomy or
pathology. The coronary CTA interpretation by the cardiologist is
attached.
FINDINGS: Vascular: Heart is normal size. Aorta normal caliber. Scattered
calcifications in the descending thoracic aorta.

Mediastinum/Nodes: No adenopathy

Lungs/Pleura: No confluent opacities or effusions.

Upper Abdomen: Imaging into the upper abdomen demonstrates no acute
findings.

Musculoskeletal: Chest wall soft tissues are unremarkable. No acute
bony abnormality.
IMPRESSION: No acute extra cardiac abnormality.

Descending aortic atherosclerosis.

## 2020-10-15 MED ORDER — NITROGLYCERIN 0.4 MG SL SUBL
0.8000 mg | SUBLINGUAL_TABLET | Freq: Once | SUBLINGUAL | Status: AC
  Administered 2020-10-15: 0.8 mg via SUBLINGUAL

## 2020-10-15 MED ORDER — NITROGLYCERIN 0.4 MG SL SUBL
SUBLINGUAL_TABLET | SUBLINGUAL | Status: AC
  Filled 2020-10-15: qty 2

## 2020-10-15 MED ORDER — METOPROLOL TARTRATE 5 MG/5ML IV SOLN
INTRAVENOUS | Status: AC
  Filled 2020-10-15: qty 5

## 2020-10-15 MED ORDER — IOHEXOL 350 MG/ML SOLN
95.0000 mL | Freq: Once | INTRAVENOUS | Status: AC | PRN
  Administered 2020-10-15: 95 mL via INTRAVENOUS

## 2020-10-15 MED ORDER — METOPROLOL TARTRATE 5 MG/5ML IV SOLN
5.0000 mg | Freq: Once | INTRAVENOUS | Status: AC
  Administered 2020-10-15: 5 mg via INTRAVENOUS

## 2020-10-15 NOTE — Progress Notes (Signed)
PROGRESS NOTE    KELSEYANN TOOT  [id number] DOB: 09-04-1955 DOA: 10/13/2020 PCP: Etter Sjogren, FNP   Brief Narrative: 65 year old with past medical history significant for hypertension, GERD who presented to the emergency department due to midsternal chest pain which started while eating dinner on 7/30.  Chest pain was described as a chest tightness.  He has had 2 previous episode over the last year.  Evaluation in the ED troponin mildly elevated 31--- 39.  CT angio chest was negative for acute PE.  Cardiology was consulted and recommended patient to be started on a heparin drip and transferred to [facility] for further evaluation.    Assessment & Plan:   Principal Problem:   Atypical chest pain Active Problems:   GERD (gastroesophageal reflux disease)   Elevated troponin   Prolonged QT interval   Obesity (BMI 30-39.9)   Benign hypertension   Non-ST elevation (NSTEMI) myocardial infarction [facility])   Precordial chest pain   Left bundle branch block   Hormone replacement therapy   Former smoker   Bilateral carotid bruits   1-Chest pain, mild elevation of troponin: Non-STEMI EKG with intermittent left bundle branch block, ST changes inferior leads. Elevation of troponin. Echo with preserved ejection fraction. Plan to continue with drip for 48 hours. On Lopressor 50 twice daily Plan for coronary CT: pending LDL 59.  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 Patient is currently asymptomatic. Need aspirin and statins daily. Vascular consulted, patient most likely need CTA of head and neck prior to discharge or as an outpatient in a few weeks.  Per vascular if LIMA graft is considered for coronary reconstruction this will need to be considered.  Moderate carotid stenosis asymptomatic will need follow-up duplex in 6  months.  Prediabetes: Patient preferred to follow-up with her PCP for consideration of metformin. Diet education.  GERD: Continue with PPI  Hypertension: Holding lisinopril and hydrochlorothiazide  ERT ; holding estrogen  COPD/emphysema: Bronchodilators as needed  Prolonged QT: Avoid QT prolonging medication  Morbid obesity: BMI 34 We discussed about diet and lifestyle modification    Estimated body mass index is 34.31 kg/m as calculated from the following:   Height as of this encounter: '5\' 1"'$  (1.549 m).   Weight as of this encounter: 82.4 kg.   DVT prophylaxis: Heparin  Code Status: Full code Family Communication: Care discussed with patient Disposition Plan:  Status is: Observation  The patient remains OBS appropriate and will d/c before 2 midnights.  Dispo: The patient is from: Home              Anticipated d/c is to: Home              Patient currently is not medically stable to d/c.   Difficult to place patient No        Consultants:  Cardiology Vascular  Procedures:  Duplex:Right Carotid: Velocities in the right ICA are consistent with a 60-79%                 stenosis. The ECA appears >50% stenosed.   Left Carotid: Velocities in the left ICA are consistent with a 40-59%  stenosis.                Non-hemodynamically significant plaque <50% noted in the  CCA.   Vertebrals:  Right vertebral artery demonstrates antegrade flow. Left  vertebral  artery demonstrates retrograde flow.  Subclavians: Left subclavian artery was stenotic. Normal flow hemodynamics  were               seen in the right subclavian artery.  Echo:Left Ventricle: Left ventricular ejection fraction, by estimation, is 65  to 70%. The left ventricle has normal function. The left ventricle has no  regional wall motion abnormalities. The left ventricular internal cavity  size was normal in size. There is   mild left ventricular hypertrophy. Left ventricular diastolic  parameters  were normal.    Antimicrobials:    Subjective: She denies chest pain, denies dizziness, syncope. Focal weakness  Objective: Vitals:   10/14/20 1921 10/14/20 2340 10/15/20 0233 10/15/20 0529  BP: (!) 162/64 105/66  (!) 107/56  Pulse: 93 84  75  Resp: '17 16  16  '$ Temp: 98.5 F (36.9 C) 98 F (36.7 C)  98.3 F (36.8 C)  TempSrc: Oral Oral    SpO2: 98% 97%  96%  Weight:   82.4 kg   Height:        Intake/Output Summary (Last 24 hours) at 10/15/2020 0818 Last data filed at 10/15/2020 0744 Gross per 24 hour  Intake 487.76 ml  Output 300 ml  Net 187.76 ml   Filed Weights   10/13/20 2102 10/15/20 0233  Weight: 82.6 kg 82.4 kg    Examination:  General exam: Appears calm and comfortable  Respiratory system: Clear to auscultation. Respiratory effort normal. Cardiovascular system: S1 & S2 heard, RRR. No JVD, murmurs, rubs, gallops or clicks. No pedal edema. Gastrointestinal system: Abdomen is nondistended, soft and nontender. No organomegaly or masses felt. Normal bowel sounds heard. Central nervous system: Alert and oriented. No focal neurological deficits. Extremities: Symmetric 5 x 5 power. Skin: No rashes, lesions or ulcers Psychiatry: Judgement and insight appear normal. Mood & affect appropriate.     Data Reviewed: I have personally reviewed following labs and imaging studies  CBC: Recent Labs  Lab 10/13/20 2200 10/14/20 0300 10/15/20 0225  WBC 10.0 8.1 7.2  NEUTROABS 7.3  --   --   HGB 13.1 12.6 11.0*  HCT 41.0 39.8 33.9*  MCV 84.7 84.0 83.5  PLT 317 276 [id number]   Basic Metabolic Panel: Recent Labs  Lab 10/13/20 2200 10/14/20 0300 10/15/20 0225  NA 136 137 138  K 3.5 3.8 3.6  CL 102 105 106  CO2 '25 24 23  '$ GLUCOSE 115* 123* 157*  BUN '23 17 15  '$ CREATININE 0.75 0.62 0.67  CALCIUM 10.2 9.7 9.9  MG  --  2.0  --   PHOS  --  2.5  --    GFR: Estimated Creatinine Clearance: 67.3 mL/min (by C-G formula based on SCr of 0.67 mg/dL). Liver Function  Tests: Recent Labs  Lab 10/14/20 0300  AST 33  ALT 20  ALKPHOS 85  BILITOT 0.4  PROT 7.4  ALBUMIN 3.7   No results for input(s): LIPASE, AMYLASE in the last 168 hours. No results for input(s): AMMONIA in the last 168 hours. Coagulation Profile: Recent Labs  Lab 10/13/20 2200 10/14/20 0300  INR 0.9 0.9   Cardiac Enzymes: No results for input(s): CKTOTAL, CKMB, CKMBINDEX, TROPONINI in the last 168 hours. BNP (last 3 results) No results for input(s): PROBNP in the last 8760 hours. HbA1C: Recent Labs    10/15/20 0225  HGBA1C 6.2*   CBG: No results for input(s): GLUCAP in the last 168 hours. Lipid Profile: Recent Labs    10/15/20 0225  CHOL  138  HDL 43  LDLCALC 59  TRIG 180*  CHOLHDL 3.2   Thyroid Function Tests: No results for input(s): TSH, T4TOTAL, FREET4, T3FREE, THYROIDAB in the last 72 hours. Anemia Panel: No results for input(s): VITAMINB12, FOLATE, FERRITIN, TIBC, IRON, RETICCTPCT in the last 72 hours. Sepsis Labs: No results for input(s): PROCALCITON, LATICACIDVEN in the last 168 hours.  Recent Results (from the past 240 hour(s))  Resp Panel by RT-PCR (Flu A&B, Covid) Nasopharyngeal Swab     Status: None   Collection Time: 10/14/20  2:37 AM   Specimen: Nasopharyngeal Swab; Nasopharyngeal(NP) swabs in vial transport medium  Result Value Ref Range Status   SARS Coronavirus 2 by RT PCR NEGATIVE NEGATIVE Final    Comment: (NOTE) SARS-CoV-2 target nucleic acids are NOT DETECTED.  The SARS-CoV-2 RNA is generally detectable in upper respiratory specimens during the acute phase of infection. The lowest concentration of SARS-CoV-2 viral copies this assay can detect is 138 copies/mL. A negative result does not preclude SARS-Cov-2 infection and should not be used as the sole basis for treatment or other patient management decisions. A negative result may occur with  improper specimen collection/handling, submission of specimen other than nasopharyngeal swab,  presence of viral mutation(s) within the areas targeted by this assay, and inadequate number of viral copies(<138 copies/mL). A negative result must be combined with clinical observations, patient history, and epidemiological information. The expected result is Negative.  Fact Sheet for Patients:  EntrepreneurPulse.com.au  Fact Sheet for Healthcare Providers:  IncredibleEmployment.be  This test is no t yet approved or cleared by the [place] FDA and  has been authorized for detection and/or diagnosis of SARS-CoV-2 by FDA under an Emergency Use Authorization (EUA). This EUA will remain  in effect (meaning this test can be used) for the duration of the COVID-19 declaration under Section 564(b)(1) of the Act, 21 U.S.C.section 360bbb-3(b)(1), unless the authorization is terminated  or revoked sooner.       Influenza A by PCR NEGATIVE NEGATIVE Final   Influenza B by PCR NEGATIVE NEGATIVE Final    Comment: (NOTE) The Xpert Xpress SARS-CoV-2/FLU/RSV plus assay is intended as an aid in the diagnosis of influenza from Nasopharyngeal swab specimens and should not be used as a sole basis for treatment. Nasal washings and aspirates are unacceptable for Xpert Xpress SARS-CoV-2/FLU/RSV testing.  Fact Sheet for Patients: EntrepreneurPulse.com.au  Fact Sheet for Healthcare Providers: IncredibleEmployment.be  This test is not yet approved or cleared by the [place] FDA and has been authorized for detection and/or diagnosis of SARS-CoV-2 by FDA under an Emergency Use Authorization (EUA). This EUA will remain in effect (meaning this test can be used) for the duration of the COVID-19 declaration under Section 564(b)(1) of the Act, 21 U.S.C. section 360bbb-3(b)(1), unless the authorization is terminated or revoked.  Performed at [facility], [address] [place], [place] [address]          Radiology  Studies: CT Angio Chest PE W/Cm &/Or Wo Cm  Result Date: 10/13/2020 CLINICAL DATA:  Pulmonary embolism, tachycardia, chest pain EXAM: CT ANGIOGRAPHY CHEST WITH CONTRAST TECHNIQUE: Multidetector CT imaging of the chest was performed using the standard protocol during bolus administration of intravenous contrast. Multiplanar CT image reconstructions and MIPs were obtained to evaluate the vascular anatomy. CONTRAST:  152m OMNIPAQUE IOHEXOL 350 MG/ML SOLN COMPARISON:  None. FINDINGS: Cardiovascular: There is adequate opacification of the pulmonary arterial tree. No intraluminal filling defect identified to suggest acute pulmonary embolism. The central pulmonary arteries are  of normal caliber. Mild global cardiomegaly. No pericardial effusion. No significant coronary artery calcification. The thoracic aorta is of normal caliber. Mild atherosclerotic calcification within the thoracic aorta. Mediastinum/Nodes: No enlarged mediastinal, hilar, or axillary lymph nodes. Thyroid gland, trachea, and esophagus demonstrate no significant findings. Lungs/Pleura: Mild biapical paraseptal emphysema. Mild bibasilar atelectasis. Minimal scarring within the medial right middle lobe with associated cylindrical bronchiectasis, likely post infectious or post inflammatory in nature. No superimposed focal pulmonary infiltrate. No pneumothorax or pleural effusion. No central obstructing lesion. Upper Abdomen: Probable left adrenal adenoma is incompletely evaluated on this examination. No acute abnormality within the visualized upper abdomen. Musculoskeletal: No acute bone abnormality. No lytic or blastic bone lesion identified. Review of the MIP images confirms the above findings. IMPRESSION: No acute pulmonary embolism. No acute intrathoracic pathology identified. Mild global cardiomegaly. Mild biapical paraseptal emphysema. Probable left adrenal adenoma, incompletely evaluated on this examination. If indicated, this could be better  assessed with dedicated CT or MRI examination. Aortic Atherosclerosis (ICD10-I70.0) and Emphysema (ICD10-J43.9). Electronically Signed   By: Fidela Salisbury MD   On: 10/13/2020 23:29   ECHOCARDIOGRAM COMPLETE  Result Date: 10/14/2020    ECHOCARDIOGRAM REPORT   Patient Name:   Carmen Morgan Date of Exam: 10/14/2020 Medical Rec #:  [id number]      Height:       61.0 in Accession #:    [id number]     Weight:       182.0 lb Date of Birth:  09-07-1955      BSA:          1.815 m Patient Age:    51 years       BP:           133/47 mmHg Patient Gender: F              HR:           85 bpm. Exam Location:  [facility] Procedure: 2D Echo, Cardiac Doppler and Color Doppler Indications:    R07.9* Chest pain, unspecified  History:        Patient has no prior history of Echocardiogram examinations.                 Risk Factors:Hypertension and GERD.  Sonographer:    Tiffany Dance Referring Phys: [id number] COURAGE EMOKPAE  Sonographer Comments: Technically difficult study due to poor echo windows. IMPRESSIONS  1. Left ventricular ejection fraction, by estimation, is 65 to 70%. The left ventricle has normal function. The left ventricle has no regional wall motion abnormalities. There is mild left ventricular hypertrophy. Left ventricular diastolic parameters were normal.  2. Right ventricular systolic function is normal. The right ventricular size is normal. Tricuspid regurgitation signal is inadequate for assessing PA pressure.  3. The mitral valve is grossly normal. Trivial mitral valve regurgitation. No evidence of mitral stenosis.  4. The aortic valve was not well visualized. Aortic valve regurgitation is trivial. No aortic stenosis is present.  5. The inferior vena cava is normal in size with <50% respiratory variability, suggesting right atrial pressure of 8 mmHg. FINDINGS  Left Ventricle: Left ventricular ejection fraction, by estimation, is 65 to 70%. The left ventricle has normal function. The left ventricle has no regional  wall motion abnormalities. The left ventricular internal cavity size was normal in size. There is  mild left ventricular hypertrophy. Left ventricular diastolic parameters were normal. Right Ventricle: The right ventricular size is normal. No increase in right ventricular wall thickness. Right  ventricular systolic function is normal. Tricuspid regurgitation signal is inadequate for assessing PA pressure. Left Atrium: Left atrial size was normal in size. Right Atrium: Right atrial size was normal in size. Pericardium: There is no evidence of pericardial effusion. Presence of pericardial fat pad. Mitral Valve: The mitral valve is grossly normal. Mild mitral annular calcification. Trivial mitral valve regurgitation. No evidence of mitral valve stenosis. Tricuspid Valve: The tricuspid valve is not well visualized. Tricuspid valve regurgitation is not demonstrated. No evidence of tricuspid stenosis. Aortic Valve: The aortic valve was not well visualized. Aortic valve regurgitation is trivial. No aortic stenosis is present. Pulmonic Valve: The pulmonic valve was not well visualized. Pulmonic valve regurgitation is not visualized. No evidence of pulmonic stenosis. Aorta: The aortic root is normal in size and structure. Venous: The inferior vena cava is normal in size with less than 50% respiratory variability, suggesting right atrial pressure of 8 mmHg. IAS/Shunts: No atrial level shunt detected by color flow Doppler.  LEFT VENTRICLE PLAX 2D LVIDd:         3.60 cm  Diastology LVIDs:         2.36 cm  LV e' medial:    11.10 cm/s LV PW:         1.13 cm  LV E/e' medial:  7.6 LV IVS:        1.06 cm  LV e' lateral:   8.94 cm/s LVOT diam:     1.70 cm  LV E/e' lateral: 9.4 LV SV:         48 LV SV Index:   26 LVOT Area:     2.27 cm  RIGHT VENTRICLE RV Basal diam:  2.46 cm RV S prime:     14.50 cm/s TAPSE (M-mode): 1.9 cm LEFT ATRIUM             Index       RIGHT ATRIUM           Index LA diam:        3.50 cm 1.93 cm/m  RA Area:      11.40 cm LA Vol (A2C):   29.3 ml 16.15 ml/m RA Volume:   22.90 ml  12.62 ml/m LA Vol (A4C):   33.9 ml 18.68 ml/m LA Biplane Vol: 30.9 ml 17.03 ml/m  AORTIC VALVE LVOT Vmax:   96.95 cm/s LVOT Vmean:  65.150 cm/s LVOT VTI:    0.212 m  AORTA Ao Root diam: 2.90 cm Ao Asc diam:  3.10 cm MITRAL VALVE MV Area (PHT): 3.77 cm    SHUNTS MV Decel Time: 201 msec    Systemic VTI:  0.21 m MV E velocity: 84.10 cm/s  Systemic Diam: 1.70 cm MV A velocity: 98.70 cm/s MV E/A ratio:  0.85 Cherlynn Kaiser MD Electronically signed by Cherlynn Kaiser MD Signature Date/Time: 10/14/2020/1:58:58 PM    Final         Scheduled Meds:  amLODipine  5 mg Oral Daily   aspirin EC  81 mg Oral Q breakfast   metoprolol tartrate  50 mg Oral BID   pantoprazole  40 mg Oral Daily   Continuous Infusions:  heparin 1,250 Units/hr (10/14/20 2109)     LOS: 0 days    Time spent: 35 minutes.     Elmarie Shiley, MD [facility]   If 7PM-7AM, please contact night-coverage [email or web address]  10/15/2020, 8:18 AM

## 2020-10-15 NOTE — Plan of Care (Signed)
Patient CT cardiac imaging completed today. Alert, oriented, ambulatory. Independent with ADLs. Heparin gtt continues.    Problem: Education: Goal: Knowledge of General Education information will improve Description: Including pain rating scale, medication(s)/side effects and non-pharmacologic comfort measures Outcome: Progressing   Problem: Health Behavior/Discharge Planning: Goal: Ability to manage health-related needs will improve Outcome: Progressing   Problem: Clinical Measurements: Goal: Ability to maintain clinical measurements within normal limits will improve Outcome: Progressing Goal: Will remain free from infection Outcome: Progressing Goal: Diagnostic test results will improve Outcome: Progressing Goal: Respiratory complications will improve Outcome: Progressing Goal: Cardiovascular complication will be avoided Outcome: Progressing   Problem: Activity: Goal: Risk for activity intolerance will decrease Outcome: Progressing   Problem: Nutrition: Goal: Adequate nutrition will be maintained Outcome: Progressing   Problem: Coping: Goal: Level of anxiety will decrease Outcome: Progressing   Problem: Elimination: Goal: Will not experience complications related to bowel motility Outcome: Progressing Goal: Will not experience complications related to urinary retention Outcome: Progressing   Problem: Pain Managment: Goal: General experience of comfort will improve Outcome: Progressing   Problem: Safety: Goal: Ability to remain free from injury will improve Outcome: Progressing   Problem: Skin Integrity: Goal: Risk for impaired skin integrity will decrease Outcome: Progressing

## 2020-10-15 NOTE — Progress Notes (Signed)
[place] for Heparin Indication: chest pain/ACS  Allergies  Allergen Reactions   Penicillins Hives    Did it involve swelling of the face/tongue/throat, SOB, or low BP? No Did it involve sudden or severe rash/hives, skin peeling, or any reaction on the inside of your mouth or nose? No Did you need to seek medical attention at a hospital or doctor's office? No When did it last happen?      childhood allergy  If all above answers are "NO", may proceed with cephalosporin use.    Vitamin B12 Nausea And Vomiting   Codeine Palpitations    Irregular Heart Rate    Patient Measurements: Height: '5\' 1"'$  (154.9 cm) Weight: 82.4 kg (181 lb 9.6 oz) IBW/kg (Calculated) : 47.8 Heparin Dosing Weight: 70 kg  Vital Signs: Temp: 98 F (36.7 C) (07/31 2340) Temp Source: Oral (07/31 2340) BP: 105/66 (07/31 2340) Pulse Rate: 84 (07/31 2340)  Labs: Recent Labs    10/13/20 2200 10/13/20 2336 10/14/20 0300 10/14/20 0653 10/14/20 1702 10/15/20 0225  HGB 13.1  --  12.6  --   --  11.0*  HCT 41.0  --  39.8  --   --  33.9*  PLT 317  --  276  --   --  242  APTT  --   --  30  --   --   --   LABPROT 12.1  --  12.4  --   --   --   INR 0.9  --  0.9  --   --   --   HEPARINUNFRC  --   --   --  0.19* 0.26* 0.42  CREATININE 0.75  --  0.62  --   --  0.67  TROPONINIHS 31* 39* 29* 20*  --   --      Estimated Creatinine Clearance: 67.3 mL/min (by C-G formula based on SCr of 0.67 mg/dL).   Medical History: Past Medical History:  Diagnosis Date   GERD (gastroesophageal reflux disease)    Hypertension    Seasonal allergies     Medications:  No current facility-administered medications on file prior to encounter.   Current Outpatient Medications on File Prior to Encounter  Medication Sig Dispense Refill   acetaminophen (TYLENOL) 500 MG tablet Take 1,000 mg by mouth every 6 (six) hours as needed for moderate pain or headache.     Cholecalciferol (VITAMIN D3)  50 MCG (2000 UT) TABS Take 2,000 Units by mouth daily.      estradiol (ESTRACE) 0.5 MG tablet Take 0.5 mg by mouth at bedtime.      fexofenadine (ALLEGRA) 180 MG tablet Take 180 mg by mouth daily.     fluticasone (FLONASE) 50 MCG/ACT nasal spray Place 2 sprays into both nostrils daily.      lisinopril-hydrochlorothiazide (ZESTORETIC) 20-25 MG tablet Take 1 tablet by mouth daily.     pantoprazole (PROTONIX) 40 MG tablet TAKE 1 TABLET BY MOUTH TWICE A DAY 60 tablet 3   Potassium Gluconate 550 (90 K) MG TABS Take 550 mg by mouth daily.     cyclobenzaprine (FLEXERIL) 10 MG tablet Take 10 mg by mouth 3 (three) times daily as needed for muscle spasms. (Patient not taking: Reported on 10/14/2020)     ketoconazole (NIZORAL) 2 % shampoo Apply 1 application topically daily as needed for psychosis. (Patient not taking: Reported on 10/14/2020)     meloxicam (MOBIC) 15 MG tablet Take 15 mg by mouth daily as needed  for pain.  (Patient not taking: Reported on 10/14/2020)     omeprazole (PRILOSEC) 40 MG capsule TAKE 1 CAPSULE IN THE MORNING AND AT BEDTIME (Patient not taking: Reported on 10/14/2020) 60 capsule 5   traMADol (ULTRAM) 50 MG tablet Take 50 mg by mouth 2 (two) times daily as needed for pain. (Patient not taking: Reported on 10/14/2020)       Assessment: 64 y.o. female with chest pain/possible NSTEMI.  Patient is not on anticoagulation prior to admission.  Plan to transfer to [facility].  Heparin level below goal on 1150 units/hr.  No overt bleeding or complications noted.  8/1 AM update:  Heparin level therapeutic after rate increase  Goal of Therapy:  Heparin level 0.3-0.7 units/ml Monitor platelets by anticoagulation protocol: Yes   Plan:  Cont heparin 1250 units/hr 1200 heparin level  Narda Bonds, PharmD, BCPS Clinical Pharmacist Phone: [phone]

## 2020-10-15 NOTE — Progress Notes (Addendum)
Subjective:  Patient resting comfortably this morning with family present at bedside at time of exam.  She is presently chest pain free and shortness of breath has resolved. Patient has ambulated in room without issue. Denies dizziness, orthopnea.  Lipid profile TG 180, LDL 59. Notably non-fasting as lab drawn at 2:25 AM and she had eaten at 11:00 PM.  Renal function stable.  A1c 6.2% - prediabetes  Notably patient has not received amlodipine or metoprolol prescribe yesterday due to soft blood pressures. Although patient's ventricular rate has improved.  Remains on IV heparin infusion.  Continues to have intermittent LBBB on telemetry  Intake/Output from previous day:  I/O last 3 completed shifts: In: 487.8 [P.O.:240; I.V.:247.8] Out: 300 [Urine:300] No intake/output data recorded.  Blood pressure (!) 107/56, pulse 75, temperature 98.3 F (36.8 C), resp. rate 16, height _0  (1.549 m), weight 82.4 kg, SpO2 96 %.  Physical Exam Vitals reviewed.  HENT:     Head: Normocephalic and atraumatic.  Cardiovascular:     Rate and Rhythm: Normal rate and regular rhythm.     Pulses: Intact distal pulses.          Carotid pulses are  on the right side with bruit and  on the left side with bruit.    Heart sounds: S1 normal and S2 normal. No murmur heard.   No gallop.  Pulmonary:     Effort: Pulmonary effort is normal. No respiratory distress.     Breath sounds: No wheezing, rhonchi or rales.  Abdominal:     General: Bowel sounds are normal.     Palpations: Abdomen is soft.  Musculoskeletal:     Right lower leg: No edema.     Left lower leg: No edema.  Skin:    General: Skin is warm and dry.  Neurological:     General: No focal deficit present.     Mental Status: She is alert and oriented to person, place, and time.    Lab Results: BMP BNP (last 3 results) Recent Labs    10/15/20 0225  BNP 28.3    ProBNP (last 3 results) No results for input(s): PROBNP in the last 8760  hours. BMP Latest Ref Rng & Units 10/15/2020 10/14/2020 10/13/2020  Glucose 70 - 99 mg/dL 157(H) 123(H) 115(H)  BUN 8 - 23 mg/dL _1 Creatinine 0.44 - 1.00 mg/dL 0.67 0.62 0.75  Sodium 135 - 145 mmol/L 138 137 136  Potassium 3.5 - 5.1 mmol/L 3.6 3.8 3.5  Chloride 98 - 111 mmol/L 106 105 102  CO2 22 - 32 mmol/L _2 Calcium 8.9 - 10.3 mg/dL 9.9 9.7 10.2   Hepatic Function Latest Ref Rng & Units 10/14/2020  Total Protein 6.5 - 8.1 g/dL 7.4  Albumin 3.5 - 5.0 g/dL 3.7  AST 15 - 41 U/L 33  ALT 0 - 44 U/L 20  Alk Phosphatase 38 - 126 U/L 85  Total Bilirubin 0.3 - 1.2 mg/dL 0.4   CBC Latest Ref Rng & Units 10/15/2020 10/14/2020 10/13/2020  WBC 4.0 - 10.5 K/uL 7.2 8.1 10.0  Hemoglobin 12.0 - 15.0 g/dL 11.0(L) 12.6 13.1  Hematocrit 36.0 - 46.0 % 33.9(L) 39.8 41.0  Platelets 150 - 400 K/uL 242 276 317   Lipid Panel     Component Value Date/Time   CHOL 138 10/15/2020 0225   TRIG 180 (H) 10/15/2020 0225   HDL 43 10/15/2020 0225   CHOLHDL 3.2 10/15/2020 0225   VLDL 36 10/15/2020 0225  LDLCALC 59 10/15/2020 0225   Cardiac Panel (last 3 results) No results for input(s): CKTOTAL, CKMB, TROPONINI, RELINDX in the last 72 hours.  HEMOGLOBIN A1C Lab Results  Component Value Date   HGBA1C 6.2 (H) 10/15/2020   MPG 131.24 10/15/2020   TSH No results for input(s): TSH in the last 8760 hours. Imaging: CT Angio Chest PE W/Cm &/Or Wo Cm  Result Date: 10/13/2020 CLINICAL DATA:  Pulmonary embolism, tachycardia, chest pain EXAM: CT ANGIOGRAPHY CHEST WITH CONTRAST TECHNIQUE: Multidetector CT imaging of the chest was performed using the standard protocol during bolus administration of intravenous contrast. Multiplanar CT image reconstructions and MIPs were obtained to evaluate the vascular anatomy. CONTRAST:  124m OMNIPAQUE IOHEXOL 350 MG/ML SOLN COMPARISON:  None. FINDINGS: Cardiovascular: There is adequate opacification of the pulmonary arterial tree. No intraluminal filling defect identified  to suggest acute pulmonary embolism. The central pulmonary arteries are of normal caliber. Mild global cardiomegaly. No pericardial effusion. No significant coronary artery calcification. The thoracic aorta is of normal caliber. Mild atherosclerotic calcification within the thoracic aorta. Mediastinum/Nodes: No enlarged mediastinal, hilar, or axillary lymph nodes. Thyroid gland, trachea, and esophagus demonstrate no significant findings. Lungs/Pleura: Mild biapical paraseptal emphysema. Mild bibasilar atelectasis. Minimal scarring within the medial right middle lobe with associated cylindrical bronchiectasis, likely post infectious or post inflammatory in nature. No superimposed focal pulmonary infiltrate. No pneumothorax or pleural effusion. No central obstructing lesion. Upper Abdomen: Probable left adrenal adenoma is incompletely evaluated on this examination. No acute abnormality within the visualized upper abdomen. Musculoskeletal: No acute bone abnormality. No lytic or blastic bone lesion identified. Review of the MIP images confirms the above findings. IMPRESSION: No acute pulmonary embolism. No acute intrathoracic pathology identified. Mild global cardiomegaly. Mild biapical paraseptal emphysema. Probable left adrenal adenoma, incompletely evaluated on this examination. If indicated, this could be better assessed with dedicated CT or MRI examination. Aortic Atherosclerosis (ICD10-I70.0) and Emphysema (ICD10-J43.9). Electronically Signed   By: AFidela SalisburyMD   On: 10/13/2020 23:29   ECHOCARDIOGRAM COMPLETE  Result Date: 10/14/2020    ECHOCARDIOGRAM REPORT   Patient Name:   KREGIS WILANDDate of Exam: 10/14/2020 Medical Rec #:  [id number]     Height:       61.0 in Accession #:    [id number]    Weight:       182.0 lb Date of Birth:  8August 10, 1957     BSA:          1.815 m Patient Age:    63years       BP:           133/47 mmHg Patient Gender: F              HR:           85 bpm. Exam [facility]  [facility]cedure: 2D Echo, Cardiac Doppler and Color Doppler Indications:    R07.9* Chest pain, unspecified  History:        Patient has no prior history of Echocardiogram examinations.                 Risk Factors:Hypertension and GERD.  Sonographer:    Tiffany Dance Referring Phys: [id number] EMOKPAE  Sonographer Comments: Technically difficult study due to poor echo windows. IMPRESSIONS  1. Left ventricular ejection fraction, by estimation, is 65 to 70%. The left ventricle has normal function. The left ventricle has no regional wall motion abnormalities. There is mild left ventricular hypertrophy. Left  ventricular diastolic parameters were normal.  2. Right ventricular systolic function is normal. The right ventricular size is normal. Tricuspid regurgitation signal is inadequate for assessing PA pressure.  3. The mitral valve is grossly normal. Trivial mitral valve regurgitation. No evidence of mitral stenosis.  4. The aortic valve was not well visualized. Aortic valve regurgitation is trivial. No aortic stenosis is present.  5. The inferior vena cava is normal in size with <50% respiratory variability, suggesting right atrial pressure of 8 mmHg. FINDINGS  Left Ventricle: Left ventricular ejection fraction, by estimation, is 65 to 70%. The left ventricle has normal function. The left ventricle has no regional wall motion abnormalities. The left ventricular internal cavity size was normal in size. There is  mild left ventricular hypertrophy. Left ventricular diastolic parameters were normal. Right Ventricle: The right ventricular size is normal. No increase in right ventricular wall thickness. Right ventricular systolic function is normal. Tricuspid regurgitation signal is inadequate for assessing PA pressure. Left Atrium: Left atrial size was normal in size. Right Atrium: Right atrial size was normal in size. Pericardium: There is no evidence of pericardial effusion. Presence of pericardial fat pad. Mitral  Valve: The mitral valve is grossly normal. Mild mitral annular calcification. Trivial mitral valve regurgitation. No evidence of mitral valve stenosis. Tricuspid Valve: The tricuspid valve is not well visualized. Tricuspid valve regurgitation is not demonstrated. No evidence of tricuspid stenosis. Aortic Valve: The aortic valve was not well visualized. Aortic valve regurgitation is trivial. No aortic stenosis is present. Pulmonic Valve: The pulmonic valve was not well visualized. Pulmonic valve regurgitation is not visualized. No evidence of pulmonic stenosis. Aorta: The aortic root is normal in size and structure. Venous: The inferior vena cava is normal in size with less than 50% respiratory variability, suggesting right atrial pressure of 8 mmHg. IAS/Shunts: No atrial level shunt detected by color flow Doppler.  LEFT VENTRICLE PLAX 2D LVIDd:         3.60 cm  Diastology LVIDs:         2.36 cm  LV e' medial:    11.10 cm/s LV PW:         1.13 cm  LV E/e' medial:  7.6 LV IVS:        1.06 cm  LV e' lateral:   8.94 cm/s LVOT diam:     1.70 cm  LV E/e' lateral: 9.4 LV SV:         48 LV SV Index:   26 LVOT Area:     2.27 cm  RIGHT VENTRICLE RV Basal diam:  2.46 cm RV S prime:     14.50 cm/s TAPSE (M-mode): 1.9 cm LEFT ATRIUM             Index       RIGHT ATRIUM           Index LA diam:        3.50 cm 1.93 cm/m  RA Area:     11.40 cm LA Vol (A2C):   29.3 ml 16.15 ml/m RA Volume:   22.90 ml  12.62 ml/m LA Vol (A4C):   33.9 ml 18.68 ml/m LA Biplane Vol: 30.9 ml 17.03 ml/m  AORTIC VALVE LVOT Vmax:   96.95 cm/s LVOT Vmean:  65.150 cm/s LVOT VTI:    0.212 m  AORTA Ao Root diam: 2.90 cm Ao Asc diam:  3.10 cm MITRAL VALVE MV Area (PHT): 3.77 cm    SHUNTS MV Decel Time: 201 msec  Systemic VTI:  0.21 m MV E velocity: 84.10 cm/s  Systemic Diam: 1.70 cm MV A velocity: 98.70 cm/s MV E/A ratio:  0.85 Cherlynn Kaiser MD Electronically signed by Cherlynn Kaiser MD Signature Date/Time: 10/14/2020/1:58:58 PM    Final      Cardiac Studies: EKG: 10/13/2020: Sinus tachycardia, 102 bpm, left bundle branch block, ST-T changes in the inferolateral leads, without underlying injury pattern.  No prior EKGs available for comparison.   10/14/2020: Normal sinus rhythm, 81 bpm, old anteroseptal infarct, minimal ST-T changes, without underlying injury pattern.  Compared to prior EKG absence of left bundle branch block.   Echocardiogram: 10/14/2020 1. Left ventricular ejection fraction, by estimation, is 65 to 70%. The  left ventricle has normal function. The left ventricle has no regional  wall motion abnormalities. There is mild left ventricular hypertrophy.  Left ventricular diastolic parameters were normal.   2. Right ventricular systolic function is normal. The right ventricular  size is normal. Tricuspid regurgitation signal is inadequate for assessing  PA pressure.   3. The mitral valve is grossly normal. Trivial mitral valve  regurgitation. No evidence of mitral stenosis.   4. The aortic valve was not well visualized. Aortic valve regurgitation  is trivial. No aortic stenosis is present.   5. The inferior vena cava is normal in size with <50% respiratory  variability, suggesting right atrial pressure of 8 mmHg.  Telemetry:  Sinus tachycardia 105 bpm, intermittent LBBB   Scheduled Meds:  amLODipine  5 mg Oral Daily   aspirin EC  81 mg Oral Q breakfast   metoprolol tartrate  50 mg Oral BID   pantoprazole  40 mg Oral Daily   Continuous Infusions:  heparin 1,250 Units/hr (10/14/20 2109)   PRN Meds:.albuterol, labetalol  Assessment/Plan:  Non-STEMI Patient present with typical chest pain 10/13/20, work  Remains chest pain free.  Continue IV heparin, will discontinue at 10/16/20 at 01:30 (after 48 hours of administration) Chest pain free since 3 AM yesterday.  Again discussed ischemic evaluation, patient continues to wish to proceed with coronary CTA.  Blood pressure control has significantly improved  this morning despite the patient has not received amlodipine or metoprolol.  However her heart rate is not ideal for coronary CTA, therefore will continue metoprolol 50 mg p.o. twice daily, will hold off on starting amlodipine at this time.   Benign essential hypertension: Blood pressure soft this morning. Has not received metoprolol or amlodipine at all. Will hold off on starting amlodipine at this time.  Continue Lopressor to  improve ventricular rate control. Will continue to monitor closely    Bilateral carotid artery bruits:  Carotid duplex pending   Intermittent left bundle branch block:  Continue telemetry, will monitor.   Prediabetes:  Discussed with patient the importance of healthy diet and lifestyle modifications.  Will defer management to primary team    Currently on hormonal placement therapy:  Will defer management to primary team   Patient was seen in collaboration with Dr. Terri Skains. He also reviewed patient's chart. Case discussed with Dr. Terri Skains and he is in agreement of the plan.    Alethia Berthold, PA-C 10/15/2020, 7:54 AM Office: [phone]

## 2020-10-15 NOTE — Progress Notes (Signed)
Carotid duplex bilateral study completed.   Please see CV Proc for preliminary results.   Ebrahim Deremer, RDMS, RVT  

## 2020-10-15 NOTE — Progress Notes (Signed)
[place] for Heparin Indication: chest pain/ACS  Allergies  Allergen Reactions   Penicillins Hives    Did it involve swelling of the face/tongue/throat, SOB, or low BP? No Did it involve sudden or severe rash/hives, skin peeling, or any reaction on the inside of your mouth or nose? No Did you need to seek medical attention at a hospital or doctor's office? No When did it last happen?      childhood allergy  If all above answers are "NO", may proceed with cephalosporin use.    Vitamin B12 Nausea And Vomiting   Codeine Palpitations    Irregular Heart Rate    Patient Measurements: Height: '5\' 1"'$  (154.9 cm) Weight: 82.4 kg (181 lb 9.6 oz) IBW/kg (Calculated) : 47.8 Heparin Dosing Weight: 70 kg  Vital Signs: Temp: 98.5 F (36.9 C) (08/01 1001) Temp Source: Oral (08/01 1001) BP: 111/81 (08/01 1001) Pulse Rate: 85 (08/01 1001)  Labs: Recent Labs    10/13/20 2200 10/13/20 2336 10/14/20 0300 10/14/20 0653 10/14/20 0653 10/14/20 1702 10/15/20 0225 10/15/20 1244  HGB 13.1  --  12.6  --   --   --  11.0*  --   HCT 41.0  --  39.8  --   --   --  33.9*  --   PLT 317  --  276  --   --   --  242  --   APTT  --   --  30  --   --   --   --   --   LABPROT 12.1  --  12.4  --   --   --   --   --   INR 0.9  --  0.9  --   --   --   --   --   HEPARINUNFRC  --   --   --  0.19*   < > 0.26* 0.42 0.52  CREATININE 0.75  --  0.62  --   --   --  0.67  --   TROPONINIHS 31* 39* 29* 20*  --   --   --   --    < > = values in this interval not displayed.     Estimated Creatinine Clearance: 67.3 mL/min (by C-G formula based on SCr of 0.67 mg/dL).   Medical History: Past Medical History:  Diagnosis Date   GERD (gastroesophageal reflux disease)    Hypertension    Seasonal allergies     Medications:  No current facility-administered medications on file prior to encounter.   Current Outpatient Medications on File Prior to Encounter  Medication Sig  Dispense Refill   acetaminophen (TYLENOL) 500 MG tablet Take 1,000 mg by mouth every 6 (six) hours as needed for moderate pain or headache.     Cholecalciferol (VITAMIN D3) 50 MCG (2000 UT) TABS Take 2,000 Units by mouth daily.      estradiol (ESTRACE) 0.5 MG tablet Take 0.5 mg by mouth at bedtime.      fexofenadine (ALLEGRA) 180 MG tablet Take 180 mg by mouth daily.     fluticasone (FLONASE) 50 MCG/ACT nasal spray Place 2 sprays into both nostrils daily.      lisinopril-hydrochlorothiazide (ZESTORETIC) 20-25 MG tablet Take 1 tablet by mouth daily.     pantoprazole (PROTONIX) 40 MG tablet TAKE 1 TABLET BY MOUTH TWICE A DAY 60 tablet 3   Potassium Gluconate 550 (90 K) MG TABS Take 550 mg by mouth daily.  cyclobenzaprine (FLEXERIL) 10 MG tablet Take 10 mg by mouth 3 (three) times daily as needed for muscle spasms. (Patient not taking: Reported on 10/14/2020)     ketoconazole (NIZORAL) 2 % shampoo Apply 1 application topically daily as needed for psychosis. (Patient not taking: Reported on 10/14/2020)     meloxicam (MOBIC) 15 MG tablet Take 15 mg by mouth daily as needed for pain.  (Patient not taking: Reported on 10/14/2020)     omeprazole (PRILOSEC) 40 MG capsule TAKE 1 CAPSULE IN THE MORNING AND AT BEDTIME (Patient not taking: Reported on 10/14/2020) 60 capsule 5   traMADol (ULTRAM) 50 MG tablet Take 50 mg by mouth 2 (two) times daily as needed for pain. (Patient not taking: Reported on 10/14/2020)       Assessment: 65 y.o. female with chest pain/possible NSTEMI.  Patient is not on anticoagulation prior to admission.  Plan to transfer to [facility].  Confirm level came back therapeutic again. Plan to stop heparin at 0130 tonight  Goal of Therapy:  Heparin level 0.3-0.7 units/ml Monitor platelets by anticoagulation protocol: Yes   Plan:  Cont heparin 1250 units/hr until tonight Daily heparin level and CBC  Onnie Boer, PharmD, BCIDP, AAHIVP, CPP Infectious Disease Pharmacist 10/15/2020 2:41  PM

## 2020-10-15 NOTE — Consult Note (Addendum)
Hospital Consult    Reason for Consult: Bilateral ICA stenosis; left subclavian artery stenosis Requesting Physician:  [facility] MRN #:  [id number]  History of Present Illness: This is a 65 y.o. female admitted to the hospital with NSTEMI.  She was noted to have carotid bruits on physical exam and a carotid duplex was obtained.  She is being seen in consultation for evaluation of carotid artery stenosis and evaluation for possible left subclavian steal.  She denies any history of CVA or TIA.  She also denies any current strokelike symptoms including slurring speech, changes in vision, or one-sided weakness.  She does describe an episode of temporary vision changes however this was about 4 years ago and has not happened since.  She also denies early fatigue of left arm and dizzy spells.  She has no history of claudication, rest pain, or nonhealing wounds of bilateral lower extremities.  She has had procedures on both greater saphenous veins in the past however is unable to describe exactly what was done.  She smoked tobacco for 40 years however quit in 2011.  She is taking an aspirin daily.  She does not take a statin.  Past Medical History:  Diagnosis Date   GERD (gastroesophageal reflux disease)    Hypertension    Seasonal allergies     Past Surgical History:  Procedure Laterality Date   ABDOMINAL HYSTERECTOMY  2014   BACK SURGERY  2012   COLONOSCOPY N/A 02/16/2019   Procedure: COLONOSCOPY;  Surgeon: Daneil Dolin, MD;  Location: [facility];  Service: Endoscopy;  Laterality: N/A;  2:00   ESOPHAGOGASTRODUODENOSCOPY N/A 01/18/2020   Procedure: ESOPHAGOGASTRODUODENOSCOPY (EGD);  Surgeon: Daneil Dolin, MD;  Location: [facility];  Service: Endoscopy;  Laterality: N/A;  2:45pm   left breast cystectomy  [place] N/A 01/18/2020   Procedure: MALONEY DILATION;  Surgeon: Daneil Dolin, MD;  Location: [facility];  Service: Endoscopy;  Laterality: N/A;   VEIN SURGERY  2013     Allergies  Allergen Reactions   Penicillins Hives    Did it involve swelling of the face/tongue/throat, SOB, or low BP? No Did it involve sudden or severe rash/hives, skin peeling, or any reaction on the inside of your mouth or nose? No Did you need to seek medical attention at a hospital or doctor's office? No When did it last happen?      childhood allergy  If all above answers are "NO", may proceed with cephalosporin use.    Vitamin B12 Nausea And Vomiting   Codeine Palpitations    Irregular Heart Rate    Prior to Admission medications   Medication Sig Start Date End Date Taking? Authorizing Provider  acetaminophen (TYLENOL) 500 MG tablet Take 1,000 mg by mouth every 6 (six) hours as needed for moderate pain or headache.   Yes [provider]  Cholecalciferol (VITAMIN D3) 50 MCG (2000 UT) TABS Take 2,000 Units by mouth daily.    Yes [provider]  estradiol (ESTRACE) 0.5 MG tablet Take 0.5 mg by mouth at bedtime.    Yes [provider]  fexofenadine (ALLEGRA) 180 MG tablet Take 180 mg by mouth daily.   Yes [provider]  fluticasone (FLONASE) 50 MCG/ACT nasal spray Place 2 sprays into both nostrils daily.    Yes [provider]  lisinopril-hydrochlorothiazide (ZESTORETIC) 20-25 MG tablet Take 1 tablet by mouth daily.   Yes [provider]  pantoprazole (PROTONIX) 40 MG tablet TAKE 1  TABLET BY MOUTH TWICE A DAY 08/07/20  Yes Annitta Needs, NP  Potassium Gluconate 550 (90 K) MG TABS Take 550 mg by mouth daily.   Yes [provider]  cyclobenzaprine (FLEXERIL) 10 MG tablet Take 10 mg by mouth 3 (three) times daily as needed for muscle spasms. Patient not taking: Reported on 10/14/2020    [provider]  ketoconazole (NIZORAL) 2 % shampoo Apply 1 application topically daily as needed for psychosis. Patient not taking: Reported on 10/14/2020 01/03/20   [provider]  meloxicam (MOBIC) 15 MG tablet  Take 15 mg by mouth daily as needed for pain.  Patient not taking: Reported on 10/14/2020 11/27/19   [provider]  omeprazole (PRILOSEC) 40 MG capsule TAKE 1 CAPSULE IN THE MORNING AND AT BEDTIME Patient not taking: Reported on 10/14/2020 10/11/20   Annitta Needs, NP  traMADol (ULTRAM) 50 MG tablet Take 50 mg by mouth 2 (two) times daily as needed for pain. Patient not taking: Reported on 10/14/2020 01/06/20   [provider]    Social History   Socioeconomic History   Marital status: Married    Spouse name: Not on file   Number of children: Not on file   Years of education: Not on file   Highest education level: Not on file  Occupational History   Not on file  Tobacco Use   Smoking status: Former   Smokeless tobacco: Never  Vaping Use   Vaping Use: Former  Substance and Sexual Activity   Alcohol use: Never   Drug use: Never   Sexual activity: Not on file  Other Topics Concern   Not on file  Social History Narrative   Not on file   Social Determinants of Health   Financial Resource Strain: Not on file  Food Insecurity: Not on file  Transportation Needs: Not on file  Physical Activity: Not on file  Stress: Not on file  Social Connections: Not on file  Intimate Partner Violence: Not on file     Family History  Problem Relation Age of Onset   Colon cancer Neg Hx     ROS: Otherwise negative unless mentioned in HPI  Physical Examination  Vitals:   10/15/20 0529 10/15/20 1001  BP: (!) 107/56 111/81  Pulse: 75 85  Resp: 16 16  Temp: 98.3 F (36.8 C) 98.5 F (36.9 C)  SpO2: 96% 97%   Body mass index is 34.31 kg/m.  General:  WDWN in NAD Gait: Not observed HENT: WNL, normocephalic Pulmonary: normal non-labored breathing, without Rales, rhonchi,  wheezing Cardiac: regular Abdomen: soft, NT/ND, no masses Skin: without rashes Vascular Exam/Pulses: Right radial 2+; left radial 1+ Extremities: without ischemic changes, without Gangrene ,  without cellulitis; without open wounds; varicose veins scattered bilateral lower extremities right more than left Musculoskeletal: no muscle wasting or atrophy  Neurologic: A&O X 3; cranial nerves grossly intact Psychiatric:  The pt has Normal affect. Lymph:  Unremarkable  CBC    Component Value Date/Time   WBC 7.2 10/15/2020 0225   RBC 4.06 10/15/2020 0225   HGB 11.0 (L) 10/15/2020 0225   HCT 33.9 (L) 10/15/2020 0225   PLT 242 10/15/2020 0225   MCV 83.5 10/15/2020 0225   MCH 27.1 10/15/2020 0225   MCHC 32.4 10/15/2020 0225   RDW 14.6 10/15/2020 0225   LYMPHSABS 1.8 10/13/2020 2200   MONOABS 0.7 10/13/2020 2200   EOSABS 0.1 10/13/2020 2200   BASOSABS 0.1 10/13/2020 2200    BMET  Component Value Date/Time   NA 138 10/15/2020 0225   K 3.6 10/15/2020 0225   CL 106 10/15/2020 0225   CO2 23 10/15/2020 0225   GLUCOSE 157 (H) 10/15/2020 0225   BUN 15 10/15/2020 0225   CREATININE 0.67 10/15/2020 0225   CALCIUM 9.9 10/15/2020 0225   GFRNONAA >60 10/15/2020 0225   GFRAA  07/12/2010 0833    >60        The eGFR has been calculated using the MDRD equation. This calculation has not been validated in all clinical situations. eGFR's persistently <60 mL/min signify possible Chronic Kidney Disease.    COAGS: Lab Results  Component Value Date   INR 0.9 10/14/2020   INR 0.9 10/13/2020     Non-Invasive Vascular Imaging:   Carotid duplex: Right ICA 60 to 79% stenosis Left ICA 40 to 59% stenosis Left subclavian artery stenosis with retrograde left vertebral artery    ASSESSMENT/PLAN: This is a 65 y.o. female with asymptomatic carotid artery stenosis as well as retrograde left vertebral artery blood flow  -Despite a retrograde flow in the left vertebral artery, this seems to be asymptomatic; patient also with 1+ left radial pulse; no indication for revascularization of left subclavian -Based on history, carotid artery stenosis also seems to be asymptomatic; plan will be  to recheck carotid duplex in office in about 6 months; office will call to arrange this -Agree with aspirin and statin daily -On call vascular surgeon Dr. Oneida Alar will evaluate the patient later today and provide further treatment plans   Dagoberto Ligas PA-C [facility] [phone]   History and exam details as above.  She does not have any history of TIA or amaurosis or stroke.  She does have some vague symptoms of exertional fatigue in her left arm but no dizziness.   Will follow pt course with her cardiac workup.  Will most likely get CTA of head and neck prior to d/c or as outpt in a few weeks.  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  If LIMA graft is considered for coronary reconstruction this would need to be considered.  Moderate carotid stenosis asymptomatic will need follow up carotid duplex in 6 months.  Will follow  Ruta Hinds, MD [facility] [place] Office: [phone]

## 2020-10-16 DIAGNOSIS — I6523 Occlusion and stenosis of bilateral carotid arteries: Secondary | ICD-10-CM

## 2020-10-16 DIAGNOSIS — I771 Stricture of artery: Secondary | ICD-10-CM

## 2020-10-16 DIAGNOSIS — R0789 Other chest pain: Secondary | ICD-10-CM | POA: Diagnosis not present

## 2020-10-16 LAB — CBC
HCT: 33.9 % — ABNORMAL LOW (ref 36.0–46.0)
Hemoglobin: 10.7 g/dL — ABNORMAL LOW (ref 12.0–15.0)
MCH: 26.7 pg (ref 26.0–34.0)
MCHC: 31.6 g/dL (ref 30.0–36.0)
MCV: 84.5 fL (ref 80.0–100.0)
Platelets: 242 10*3/uL (ref 150–400)
RBC: 4.01 MIL/uL (ref 3.87–5.11)
RDW: 14.6 % (ref 11.5–15.5)
WBC: 7.5 10*3/uL (ref 4.0–10.5)
nRBC: 0 % (ref 0.0–0.2)

## 2020-10-16 MED ORDER — ASPIRIN 81 MG PO TBEC: 81.0000 mg | DELAYED_RELEASE_TABLET | Freq: Every day | ORAL | 11 refills | Status: DC

## 2020-10-16 MED ORDER — METOPROLOL TARTRATE 50 MG PO TABS: 50.0000 mg | ORAL_TABLET | Freq: Two times a day (BID) | ORAL | 3 refills | Status: DC

## 2020-10-16 MED ORDER — METOPROLOL TARTRATE 50 MG PO TABS
50.0000 mg | ORAL_TABLET | Freq: Two times a day (BID) | ORAL | 3 refills | Status: DC
Start: 2020-10-16 — End: 2020-10-16

## 2020-10-16 MED ORDER — ATORVASTATIN CALCIUM 20 MG PO TABS: 20.0000 mg | ORAL_TABLET | Freq: Every day | ORAL | 0 refills | Status: DC

## 2020-10-16 MED ORDER — ATORVASTATIN CALCIUM 20 MG PO TABS: 20.0000 mg | ORAL_TABLET | Freq: Every day | ORAL | 3 refills | Status: DC

## 2020-10-16 MED ORDER — ASPIRIN 81 MG PO TBEC: 81.0000 mg | DELAYED_RELEASE_TABLET | Freq: Every day | ORAL | 11 refills | Status: AC

## 2020-10-16 MED ORDER — ATORVASTATIN CALCIUM 10 MG PO TABS
20.0000 mg | ORAL_TABLET | Freq: Every day | ORAL | Status: DC
  Administered 2020-10-16: 20 mg via ORAL
  Filled 2020-10-16: qty 2

## 2020-10-16 NOTE — Discharge Instructions (Signed)

## 2020-10-16 NOTE — Discharge Summary (Signed)
Physician Discharge Summary  Makyia Strouth Warren [id number] DOB: 1955-06-05 DOA: 10/13/2020  PCP: Etter Sjogren, FNP  Admit date: 10/13/2020 Discharge date: 10/16/2020  Admitted From: Home  Disposition: Home.   Recommendations for Outpatient Follow-up:  Follow up with PCP in 1-2 weeks Please obtain BMP/CBC in one week Needs to follow-up with cardiology for further risk factors modification   Home Health: None.   Discharge Condition: Stable.  CODE STATUS: Full Code Diet recommendation: Heart Healthy   Brief/Interim Summary: 65 year old with past medical history significant for hypertension, GERD who presented to the emergency department due to midsternal chest pain which started while eating dinner on 7/30.  Chest pain was described as a chest tightness.  He has had 2 previous episode over the last year.   Evaluation in the ED troponin mildly elevated 31--- 39.  CT angio chest was negative for acute PE.  Cardiology was consulted and recommended patient to be started on a heparin drip and transferred to [facility] for further evaluation.   1-Chest pain, mild elevation of troponin: Non-STEMI EKG with intermittent left bundle branch block, ST changes inferior leads. Elevation of troponin. Echo with preserved ejection fraction. Plan to continue with drip for 48 hours. On Lopressor 50 twice daily Coronary CT showed nonobstructive CAD.  Calcium score 14.  Placing patient in the 60 percentile for age and sex LDL 80. Plan for medical management, statins, aspirin and beta-blocker. Follow-up with cardiology for further risk factors modification.  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 Patient is currently asymptomatic. Need aspirin and statins daily. Vascular consulted, patient most likely need CTA of head and neck   as an outpatient in a few weeks.  Per  vascular if LIMA graft is considered for coronary reconstruction this will need to be considered.  Moderate carotid stenosis asymptomatic will need follow-up duplex in 6 months.  -Vascular will arrange CTA as an outpatient and further follow-up.  Prediabetes: Patient preferred to follow-up with her PCP for consideration of metformin. Diet education.   GERD: Continue with PPI   Hypertension: Discontinue lisinopril and hydrochlorothiazide, patient will be on metoprolol   ERT ; present estrogen   COPD/emphysema: Bronchodilators as needed   Prolonged QT: Avoid QT prolonging medication   Morbid obesity: BMI 34 We discussed about diet and lifestyle modification      Discharge Diagnoses:  Principal Problem:   Atypical chest pain Active Problems:   GERD (gastroesophageal reflux disease)   Elevated troponin   Prolonged QT interval   Obesity (BMI 30-39.9)   Benign hypertension   Non-ST elevation (NSTEMI) myocardial infarction [facility])   Precordial chest pain   Left bundle branch block   Hormone replacement therapy   Former smoker   Bilateral carotid bruits   Chest pain   Bilateral carotid artery stenosis   Stenosis of left subclavian artery [facility])    Discharge Instructions  Discharge Instructions     Diet - low sodium heart healthy   Complete by: As directed    Increase activity slowly   Complete by: As directed       Allergies as of 10/16/2020       Reactions   Penicillins Hives   Did it involve swelling of the face/tongue/throat, SOB, or low BP? No Did it involve sudden or severe rash/hives, skin peeling, or any reaction on the inside of your mouth or nose?  No Did you need to seek medical attention at a hospital or doctor's office? No When did it last happen?      childhood allergy  If all above answers are "NO", may proceed with cephalosporin use.   Vitamin B12 Nausea And Vomiting   Codeine Palpitations   Irregular Heart Rate        Medication List      STOP taking these medications    cyclobenzaprine 10 MG tablet Commonly known as: FLEXERIL   ketoconazole 2 % shampoo Commonly known as: NIZORAL   lisinopril-hydrochlorothiazide 20-25 MG tablet Commonly known as: ZESTORETIC   meloxicam 15 MG tablet Commonly known as: MOBIC   omeprazole 40 MG capsule Commonly known as: PRILOSEC   Potassium Gluconate 550 (90 K) MG Tabs   traMADol 50 MG tablet Commonly known as: ULTRAM       TAKE these medications    acetaminophen 500 MG tablet Commonly known as: TYLENOL Take 1,000 mg by mouth every 6 (six) hours as needed for moderate pain or headache.   aspirin 81 MG EC tablet Take 1 tablet (81 mg total) by mouth daily with breakfast. Swallow whole. Start taking on: October 17, 2020   atorvastatin 20 MG tablet Commonly known as: LIPITOR Take 1 tablet (20 mg total) by mouth daily. Start taking on: October 17, 2020   estradiol 0.5 MG tablet Commonly known as: ESTRACE Take 0.5 mg by mouth at bedtime.   fexofenadine 180 MG tablet Commonly known as: ALLEGRA Take 180 mg by mouth daily.   fluticasone 50 MCG/ACT nasal spray Commonly known as: FLONASE Place 2 sprays into both nostrils daily.   metoprolol tartrate 50 MG tablet Commonly known as: LOPRESSOR Take 1 tablet (50 mg total) by mouth 2 (two) times daily.   pantoprazole 40 MG tablet Commonly known as: PROTONIX TAKE 1 TABLET BY MOUTH TWICE A DAY   Vitamin D3 50 MCG (2000 UT) Tabs Take 2,000 Units by mouth daily.        Follow-up Information     Rex Kras, DO Follow up on 10/25/2020.   Specialties: Cardiology, Vascular Surgery Why: 11am  Please bring medications bottles with you. Contact information: [place] [address] [phone]                Allergies  Allergen Reactions   Penicillins Hives    Did it involve swelling of the face/tongue/throat, SOB, or low BP? No Did it involve sudden or severe rash/hives, skin  peeling, or any reaction on the inside of your mouth or nose? No Did you need to seek medical attention at a hospital or doctor's office? No When did it last happen?      childhood allergy  If all above answers are "NO", may proceed with cephalosporin use.    Vitamin B12 Nausea And Vomiting   Codeine Palpitations    Irregular Heart Rate    Consultations: Cardiology Vascular.    Procedures/Studies: CT Angio Chest PE W/Cm &/Or Wo Cm  Result Date: 10/13/2020 CLINICAL DATA:  Pulmonary embolism, tachycardia, chest pain EXAM: CT ANGIOGRAPHY CHEST WITH CONTRAST TECHNIQUE: Multidetector CT imaging of the chest was performed using the standard protocol during bolus administration of intravenous contrast. Multiplanar CT image reconstructions and MIPs were obtained to evaluate the vascular anatomy. CONTRAST:  162m OMNIPAQUE IOHEXOL 350 MG/ML SOLN COMPARISON:  None. FINDINGS: Cardiovascular: There is adequate opacification of the pulmonary arterial tree. No intraluminal filling defect identified to suggest acute pulmonary embolism.  The central pulmonary arteries are of normal caliber. Mild global cardiomegaly. No pericardial effusion. No significant coronary artery calcification. The thoracic aorta is of normal caliber. Mild atherosclerotic calcification within the thoracic aorta. Mediastinum/Nodes: No enlarged mediastinal, hilar, or axillary lymph nodes. Thyroid gland, trachea, and esophagus demonstrate no significant findings. Lungs/Pleura: Mild biapical paraseptal emphysema. Mild bibasilar atelectasis. Minimal scarring within the medial right middle lobe with associated cylindrical bronchiectasis, likely post infectious or post inflammatory in nature. No superimposed focal pulmonary infiltrate. No pneumothorax or pleural effusion. No central obstructing lesion. Upper Abdomen: Probable left adrenal adenoma is incompletely evaluated on this examination. No acute abnormality within the visualized upper  abdomen. Musculoskeletal: No acute bone abnormality. No lytic or blastic bone lesion identified. Review of the MIP images confirms the above findings. IMPRESSION: No acute pulmonary embolism. No acute intrathoracic pathology identified. Mild global cardiomegaly. Mild biapical paraseptal emphysema. Probable left adrenal adenoma, incompletely evaluated on this examination. If indicated, this could be better assessed with dedicated CT or MRI examination. Aortic Atherosclerosis (ICD10-I70.0) and Emphysema (ICD10-J43.9). Electronically Signed   By: Fidela Salisbury MD   On: 10/13/2020 23:29   CT CORONARY MORPH W/CTA COR W/SCORE W/CA W/CM &/OR WO/CM  Addendum Date: 10/16/2020   ADDENDUM REPORT: 10/16/2020 12:28 HISTORY: Chest pain, nonspecific EXAM: Cardiac/Coronary  CT TECHNIQUE: The patient was scanned on a Arboriculturist. PROTOCOL: A 120 kV prospective scan was triggered in the descending thoracic aorta at 111 HU's. Axial non-contrast 3 mm slices were carried out through the heart. The data set was analyzed on a dedicated work station and scored using the [employer]in[employer]. Gantry rotation speed was 250 msecs and collimation was .6 mm. No IV beta blockade but 0.8 mg of sl NTG was given. The 3D data set was reconstructed in 5% intervals of the 67-82 % of the R-R cycle. Diastolic phases were analyzed on a dedicated work station using MPR, MIP and VRT modes. The patient received 68m OMNIPAQUE IOHEXOL 350 MG/ML SOLN of contrast. FINDINGS: Image quality: Average. Artifact: Limited. Coronary artery calcification score: Left main: 0 Left anterior descending artery: 0 Left circumflex artery: 14.1 Right coronary artery: 0 Total coronary calcium score of 14.1, places the patient at the 60th percentile for age and sex matched control. Coronary arteries: Normal coronary origins.  Right dominance. Left Main Coronary Artery: The left main is a normal caliber vessel with a normal take off from the left coronary cusp that  bifurcates into a LAD and LCX. There is no plaque or stenosis. Left Anterior Descending Coronary Artery: Normal caliber vessel, reaches the apex, gives off 1 patent diagonal branches. Minimal stenosis (0-24%) due to noncalcified plaque in the proximal LAD. Mid to distal LAD is patent with evidence of myocardial bridge within the mid LAD. Left Circumflex Artery: Normal caliber vessel, non-dominant, travels within the atrioventricular groove, gives off 3 patent obtuse marginal branches. The LCX is overall patent with mild stenosis (25-49%) within the mid LCx due to mixed plaque. Right Coronary Artery: The RCA is dominant with normal take off from the right coronary cusp. The RCA terminates as a PDA and right posterolateral branch. Minimal stenosis (0-24%) due to mixed plaque in proximal RCA. Mid to Distal RCA is overall patent without evidence of plaque or stenosis. Left Atrium: Grossly normal in size with no left atrial appendage filling defect. Left Ventricle: Grossly normal in size. There are no stigmata of prior infarction. There is no abnormal filling defect. Pulmonary arteries: Normal in size without proximal filling  defect. Pulmonary veins: Normal pulmonary venous drainage. Aorta: Normal size, 31 mm at the mid ascending aorta (level of the right PA) measured double oblique. Aortic atherosclerosis within descending aorta. No dissection. Pericardium: Normal thickness with no significant effusion or calcium present. Cardiac valves: The aortic valve is trileaflet without significant calcification. The mitral valve is normal structure without significant calcification. Extra-cardiac findings: See attached radiology report for non-cardiac structures. IMPRESSION: 1. Total coronary calcium score of 14.1. This was 60th percentile for age and sex matched control. 2. Normal coronary origin with right dominance. 3. CAD-RADS = 2. Minimal stenosis (0-24%) due to noncalcified plaque in the proximal LAD. Mild stenosis (25-49%)  within the mid LCx due to mixed plaque. Minimal stenosis (0-24%) due to mixed plaque in proximal RCA. 4. Aortic atherosclerosis within descending aorta. RECOMMENDATIONS: Consider non-atherosclerotic causes of chest pain. Consider preventive therapy and risk factor modification. Electronically Signed   By: Rex Kras   On: 10/16/2020 12:28   Result Date: 10/16/2020 EXAM: OVER-READ INTERPRETATION  CT CHEST The following report is an over-read performed by radiologist Dr. Rolm Baptise of [facility], [place] on 10/15/2020. This over-read does not include interpretation of cardiac or coronary anatomy or pathology. The coronary CTA interpretation by the cardiologist is attached. COMPARISON:  None. FINDINGS: Vascular: Heart is normal size. Aorta normal caliber. Scattered calcifications in the descending thoracic aorta. Mediastinum/Nodes: No adenopathy Lungs/Pleura: No confluent opacities or effusions. Upper Abdomen: Imaging into the upper abdomen demonstrates no acute findings. Musculoskeletal: Chest wall soft tissues are unremarkable. No acute bony abnormality. IMPRESSION: No acute extra cardiac abnormality. Descending aortic atherosclerosis. Electronically Signed: By: Rolm Baptise M.D. On: 10/15/2020 16:55   ECHOCARDIOGRAM COMPLETE  Result Date: 10/14/2020    ECHOCARDIOGRAM REPORT   Patient Name:   RHAE AUSLANDER Date of Exam: 10/14/2020 Medical Rec #:  [id number]      Height:       61.0 in Accession #:    [id number]     Weight:       182.0 lb Date of Birth:  1955/11/29      BSA:          1.815 m Patient Age:    65 years       BP:           133/47 mmHg Patient Gender: F              HR:           85 bpm. Exam Location:  [facility] Procedure: 2D Echo, Cardiac Doppler and Color Doppler Indications:    R07.9* Chest pain, unspecified  History:        Patient has no prior history of Echocardiogram examinations.                 Risk Factors:Hypertension and GERD.  Sonographer:    Tiffany Dance Referring Phys: [id number]  COURAGE EMOKPAE  Sonographer Comments: Technically difficult study due to poor echo windows. IMPRESSIONS  1. Left ventricular ejection fraction, by estimation, is 65 to 70%. The left ventricle has normal function. The left ventricle has no regional wall motion abnormalities. There is mild left ventricular hypertrophy. Left ventricular diastolic parameters were normal.  2. Right ventricular systolic function is normal. The right ventricular size is normal. Tricuspid regurgitation signal is inadequate for assessing PA pressure.  3. The mitral valve is grossly normal. Trivial mitral valve regurgitation. No evidence of mitral stenosis.  4. The aortic valve was not well visualized. Aortic valve regurgitation  is trivial. No aortic stenosis is present.  5. The inferior vena cava is normal in size with <50% respiratory variability, suggesting right atrial pressure of 8 mmHg. FINDINGS  Left Ventricle: Left ventricular ejection fraction, by estimation, is 65 to 70%. The left ventricle has normal function. The left ventricle has no regional wall motion abnormalities. The left ventricular internal cavity size was normal in size. There is  mild left ventricular hypertrophy. Left ventricular diastolic parameters were normal. Right Ventricle: The right ventricular size is normal. No increase in right ventricular wall thickness. Right ventricular systolic function is normal. Tricuspid regurgitation signal is inadequate for assessing PA pressure. Left Atrium: Left atrial size was normal in size. Right Atrium: Right atrial size was normal in size. Pericardium: There is no evidence of pericardial effusion. Presence of pericardial fat pad. Mitral Valve: The mitral valve is grossly normal. Mild mitral annular calcification. Trivial mitral valve regurgitation. No evidence of mitral valve stenosis. Tricuspid Valve: The tricuspid valve is not well visualized. Tricuspid valve regurgitation is not demonstrated. No evidence of tricuspid  stenosis. Aortic Valve: The aortic valve was not well visualized. Aortic valve regurgitation is trivial. No aortic stenosis is present. Pulmonic Valve: The pulmonic valve was not well visualized. Pulmonic valve regurgitation is not visualized. No evidence of pulmonic stenosis. Aorta: The aortic root is normal in size and structure. Venous: The inferior vena cava is normal in size with less than 50% respiratory variability, suggesting right atrial pressure of 8 mmHg. IAS/Shunts: No atrial level shunt detected by color flow Doppler.  LEFT VENTRICLE PLAX 2D LVIDd:         3.60 cm  Diastology LVIDs:         2.36 cm  LV e' medial:    11.10 cm/s LV PW:         1.13 cm  LV E/e' medial:  7.6 LV IVS:        1.06 cm  LV e' lateral:   8.94 cm/s LVOT diam:     1.70 cm  LV E/e' lateral: 9.4 LV SV:         48 LV SV Index:   26 LVOT Area:     2.27 cm  RIGHT VENTRICLE RV Basal diam:  2.46 cm RV S prime:     14.50 cm/s TAPSE (M-mode): 1.9 cm LEFT ATRIUM             Index       RIGHT ATRIUM           Index LA diam:        3.50 cm 1.93 cm/m  RA Area:     11.40 cm LA Vol (A2C):   29.3 ml 16.15 ml/m RA Volume:   22.90 ml  12.62 ml/m LA Vol (A4C):   33.9 ml 18.68 ml/m LA Biplane Vol: 30.9 ml 17.03 ml/m  AORTIC VALVE LVOT Vmax:   96.95 cm/s LVOT Vmean:  65.150 cm/s LVOT VTI:    0.212 m  AORTA Ao Root diam: 2.90 cm Ao Asc diam:  3.10 cm MITRAL VALVE MV Area (PHT): 3.77 cm    SHUNTS MV Decel Time: 201 msec    Systemic VTI:  0.21 m MV E velocity: 84.10 cm/s  Systemic Diam: 1.70 cm MV A velocity: 98.70 cm/s MV E/A ratio:  0.85 Cherlynn Kaiser MD Electronically signed by Cherlynn Kaiser MD Signature Date/Time: 10/14/2020/1:58:58 PM    Final    VAS US CAROTID  Result Date: 10/15/2020 Carotid Arterial Duplex Study Patient  Name:  MALARI PASKINS  Date of Exam:   10/15/2020 Medical Rec #: [id number]       Accession #:    [id number] Date of Birth: Apr 08, 1955       Patient Gender: F Patient Age:   64Y Exam Location[facility]  [facility]  Procedure:      VAS US CAROTID Referring Phys: [id number] Rex Kras --------------------------------------------------------------------------------  Indications:       Bilateral bruits. Risk Factors:      Hypertension, prior MI. Comparison Study:  Per patient, previous imaging at [facility] showed carotid                    stenosis approximately one year ago. Patient endorses no                    follow up in the interim. Results not available. Performing Technologist: Darlin Coco RDMS,RVT  Examination Guidelines: A complete evaluation includes B-mode imaging, spectral Doppler, color Doppler, and power Doppler as needed of all accessible portions of each vessel. Bilateral testing is considered an integral part of a complete examination. Limited examinations for reoccurring indications may be performed as noted.  Right Carotid Findings: +----------+--------+--------+--------+------------------------------+---------+           PSV cm/sEDV cm/sStenosisPlaque Description            Comments  +----------+--------+--------+--------+------------------------------+---------+ CCA Prox  89      23                                            turbulent +----------+--------+--------+--------+------------------------------+---------+ CCA Distal91      27                                                      +----------+--------+--------+--------+------------------------------+---------+ ICA Prox  204     67      60-79%  calcific, heterogenous and                                                irregular                               +----------+--------+--------+--------+------------------------------+---------+ ICA Mid   176     52                                                      +----------+--------+--------+--------+------------------------------+---------+ ICA Distal144     47                                                       +----------+--------+--------+--------+------------------------------+---------+ ECA       221     36      >50%  calcific, heterogenous and                                                irregular                               +----------+--------+--------+--------+------------------------------+---------+ +----------+--------+-------+--------------+-------------------+           PSV cm/sEDV cmsDescribe      Arm Pressure (mmHG) +----------+--------+-------+--------------+-------------------+ [id number]            Increased flow                    +----------+--------+-------+--------------+-------------------+ +---------+--------+--+--------+--+---------+ VertebralPSV cm/s87EDV cm/s21Antegrade +---------+--------+--+--------+--+---------+  Left Carotid Findings: +----------+--------+--------+--------+---------------------+------------------+           PSV cm/sEDV cm/sStenosisPlaque Description   Comments           +----------+--------+--------+--------+---------------------+------------------+ CCA Prox  120     30                                                      +----------+--------+--------+--------+---------------------+------------------+ CCA Mid   120     42      <50%    focal, smooth and                                                         hyperechoic                             +----------+--------+--------+--------+---------------------+------------------+ CCA Distal98      25                                   intimal thickening +----------+--------+--------+--------+---------------------+------------------+ ICA Prox  162     49      40-59%  heterogenous                            +----------+--------+--------+--------+---------------------+------------------+ ICA Mid   115     35                                                      +----------+--------+--------+--------+---------------------+------------------+ ICA  Distal135     43                                                      +----------+--------+--------+--------+---------------------+------------------+ ECA       127     19                                                      +----------+--------+--------+--------+---------------------+------------------+ +----------+--------+--------+------------------------+-------------------+  PSV cm/sEDV cm/sDescribe                Arm Pressure (mmHG) +----------+--------+--------+------------------------+-------------------+ Subclavian175             Post-stenotic turbulence                    +----------+--------+--------+------------------------+-------------------+ +---------+--------+--+--------+----------+ VertebralPSV cm/s76EDV cm/sRetrograde +---------+--------+--+--------+----------+   Summary: Right Carotid: Velocities in the right ICA are consistent with a 60-79%                stenosis. The ECA appears >50% stenosed. Left Carotid: Velocities in the left ICA are consistent with a 40-59% stenosis.               Non-hemodynamically significant plaque <50% noted in the CCA. Vertebrals:  Right vertebral artery demonstrates antegrade flow. Left vertebral              artery demonstrates retrograde flow. Subclavians: Left subclavian artery was stenotic. Normal flow hemodynamics were              seen in the right subclavian artery. *See table(s) above for measurements and observations.  Electronically signed by Ruta Hinds MD on 10/15/2020 at 3:29:23 PM.    Final      Subjective: She is feeling well, no chest pain.   Discharge Exam: Vitals:   10/16/20 0612 10/16/20 0925  BP: (!) 120/59 116/67  Pulse: 80 83  Resp: 17 12  Temp: 98.2 F (36.8 C)   SpO2: 98% 98%     General: Pt is alert, awake, not in acute distress Cardiovascular: RRR, S1/S2 +, no rubs, no gallops Respiratory: CTA bilaterally, no wheezing, no rhonchi Abdominal: Soft, NT, ND, bowel sounds  + Extremities: no edema, no cyanosis    The results of significant diagnostics from this hospitalization (including imaging, microbiology, ancillary and laboratory) are listed below for reference.     Microbiology: Recent Results (from the past 240 hour(s))  Resp Panel by RT-PCR (Flu A&B, Covid) Nasopharyngeal Swab     Status: None   Collection Time: 10/14/20  2:37 AM   Specimen: Nasopharyngeal Swab; Nasopharyngeal(NP) swabs in vial transport medium  Result Value Ref Range Status   SARS Coronavirus 2 by RT PCR NEGATIVE NEGATIVE Final    Comment: (NOTE) SARS-CoV-2 target nucleic acids are NOT DETECTED.  The SARS-CoV-2 RNA is generally detectable in upper respiratory specimens during the acute phase of infection. The lowest concentration of SARS-CoV-2 viral copies this assay can detect is 138 copies/mL. A negative result does not preclude SARS-Cov-2 infection and should not be used as the sole basis for treatment or other patient management decisions. A negative result may occur with  improper specimen collection/handling, submission of specimen other than nasopharyngeal swab, presence of viral mutation(s) within the areas targeted by this assay, and inadequate number of viral copies(<138 copies/mL). A negative result must be combined with clinical observations, patient history, and epidemiological information. The expected result is Negative.  Fact Sheet for Patients:  EntrepreneurPulse.com.au  Fact Sheet for Healthcare Providers:  IncredibleEmployment.be  This test is no t yet approved or cleared by the [place] FDA and  has been authorized for detection and/or diagnosis of SARS-CoV-2 by FDA under an Emergency Use Authorization (EUA). This EUA will remain  in effect (meaning this test can be used) for the duration of the COVID-19 declaration under Section 564(b)(1) of the Act, 21 U.S.C.section 360bbb-3(b)(1), unless the authorization  is terminated  or revoked sooner.  Influenza A by PCR NEGATIVE NEGATIVE Final   Influenza B by PCR NEGATIVE NEGATIVE Final    Comment: (NOTE) The Xpert Xpress SARS-CoV-2/FLU/RSV plus assay is intended as an aid in the diagnosis of influenza from Nasopharyngeal swab specimens and should not be used as a sole basis for treatment. Nasal washings and aspirates are unacceptable for Xpert Xpress SARS-CoV-2/FLU/RSV testing.  Fact Sheet for Patients: EntrepreneurPulse.com.au  Fact Sheet for Healthcare Providers: IncredibleEmployment.be  This test is not yet approved or cleared by the [place] FDA and has been authorized for detection and/or diagnosis of SARS-CoV-2 by FDA under an Emergency Use Authorization (EUA). This EUA will remain in effect (meaning this test can be used) for the duration of the COVID-19 declaration under Section 564(b)(1) of the Act, 21 U.S.C. section 360bbb-3(b)(1), unless the authorization is terminated or revoked.  Performed at [facility], [address], [place], [place] [address]      Labs: BNP (last 3 results) Recent Labs    10/15/20 0225  BNP 99991111   Basic Metabolic Panel: Recent Labs  Lab 10/13/20 2200 10/14/20 0300 10/15/20 0225  NA 136 137 138  K 3.5 3.8 3.6  CL 102 105 106  CO2 '25 24 23  '$ GLUCOSE 115* 123* 157*  BUN '23 17 15  '$ CREATININE 0.75 0.62 0.67  CALCIUM 10.2 9.7 9.9  MG  --  2.0  --   PHOS  --  2.5  --    Liver Function Tests: Recent Labs  Lab 10/14/20 0300  AST 33  ALT 20  ALKPHOS 85  BILITOT 0.4  PROT 7.4  ALBUMIN 3.7   No results for input(s): LIPASE, AMYLASE in the last 168 hours. No results for input(s): AMMONIA in the last 168 hours. CBC: Recent Labs  Lab 10/13/20 2200 10/14/20 0300 10/15/20 0225 10/16/20 0348  WBC 10.0 8.1 7.2 7.5  NEUTROABS 7.3  --   --   --   HGB 13.1 12.6 11.0* 10.7*  HCT 41.0 39.8 33.9* 33.9*  MCV 84.7 84.0 83.5 84.5  PLT 317 276 242  242   Cardiac Enzymes: No results for input(s): CKTOTAL, CKMB, CKMBINDEX, TROPONINI in the last 168 hours. BNP: Invalid input(s): POCBNP CBG: No results for input(s): GLUCAP in the last 168 hours. D-Dimer No results for input(s): DDIMER in the last 72 hours. Hgb A1c Recent Labs    10/15/20 0225  HGBA1C 6.2*   Lipid Profile Recent Labs    10/15/20 0225  CHOL 138  HDL 43  LDLCALC 59  TRIG 180*  CHOLHDL 3.2   Thyroid function studies No results for input(s): TSH, T4TOTAL, T3FREE, THYROIDAB in the last 72 hours.  Invalid input(s): FREET3 Anemia work up No results for input(s): VITAMINB12, FOLATE, FERRITIN, TIBC, IRON, RETICCTPCT in the last 72 hours. Urinalysis No results found for: COLORURINE, APPEARANCEUR, [place], [place], GLUCOSEU, [place], [place], [place], PROTEINUR, UROBILINOGEN, NITRITE, LEUKOCYTESUR Sepsis Labs Invalid input(s): PROCALCITONIN,  WBC,  LACTICIDVEN Microbiology Recent Results (from the past 240 hour(s))  Resp Panel by RT-PCR (Flu A&B, Covid) Nasopharyngeal Swab     Status: None   Collection Time: 10/14/20  2:37 AM   Specimen: Nasopharyngeal Swab; Nasopharyngeal(NP) swabs in vial transport medium  Result Value Ref Range Status   SARS Coronavirus 2 by RT PCR NEGATIVE NEGATIVE Final    Comment: (NOTE) SARS-CoV-2 target nucleic acids are NOT DETECTED.  The SARS-CoV-2 RNA is generally detectable in upper respiratory specimens during the acute phase of infection. The lowest concentration of SARS-CoV-2 viral copies  this assay can detect is 138 copies/mL. A negative result does not preclude SARS-Cov-2 infection and should not be used as the sole basis for treatment or other patient management decisions. A negative result may occur with  improper specimen collection/handling, submission of specimen other than nasopharyngeal swab, presence of viral mutation(s) within the areas targeted by this assay, and inadequate number of viral copies(<138  copies/mL). A negative result must be combined with clinical observations, patient history, and epidemiological information. The expected result is Negative.  Fact Sheet for Patients:  EntrepreneurPulse.com.au  Fact Sheet for Healthcare Providers:  IncredibleEmployment.be  This test is no t yet approved or cleared by the [place] FDA and  has been authorized for detection and/or diagnosis of SARS-CoV-2 by FDA under an Emergency Use Authorization (EUA). This EUA will remain  in effect (meaning this test can be used) for the duration of the COVID-19 declaration under Section 564(b)(1) of the Act, 21 U.S.C.section 360bbb-3(b)(1), unless the authorization is terminated  or revoked sooner.       Influenza A by PCR NEGATIVE NEGATIVE Final   Influenza B by PCR NEGATIVE NEGATIVE Final    Comment: (NOTE) The Xpert Xpress SARS-CoV-2/FLU/RSV plus assay is intended as an aid in the diagnosis of influenza from Nasopharyngeal swab specimens and should not be used as a sole basis for treatment. Nasal washings and aspirates are unacceptable for Xpert Xpress SARS-CoV-2/FLU/RSV testing.  Fact Sheet for Patients: EntrepreneurPulse.com.au  Fact Sheet for Healthcare Providers: IncredibleEmployment.be  This test is not yet approved or cleared by the [place] FDA and has been authorized for detection and/or diagnosis of SARS-CoV-2 by FDA under an Emergency Use Authorization (EUA). This EUA will remain in effect (meaning this test can be used) for the duration of the COVID-19 declaration under Section 564(b)(1) of the Act, 21 U.S.C. section 360bbb-3(b)(1), unless the authorization is terminated or revoked.  Performed at [facility], [address], [place], [place] [address]      Time coordinating discharge: 40 minutes  SIGNED:   Elmarie Shiley, MD  [facility]

## 2020-10-16 NOTE — Progress Notes (Signed)
  Progress Note    10/16/2020 3:43 PM * No surgery found *  Subjective:  No complaints. She has been cleared for discharge by cardiolgy   Vitals:   10/16/20 0612 10/16/20 0925  BP: (!) 120/59 116/67  Pulse: 80 83  Resp: 17 12  Temp: 98.2 F (36.8 C)   SpO2: 98% 98%    Physical Exam: General appearance: Awake, alert in no apparent distress Neurologic: Alert and oriented x4, speech clear, face symmetric, Cardiac: Heart rate and rhythm are regular Respirations: Nonlabored   CBC    Component Value Date/Time   WBC 7.5 10/16/2020 0348   RBC 4.01 10/16/2020 0348   HGB 10.7 (L) 10/16/2020 0348   HCT 33.9 (L) 10/16/2020 0348   PLT 242 10/16/2020 0348   MCV 84.5 10/16/2020 0348   MCH 26.7 10/16/2020 0348   MCHC 31.6 10/16/2020 0348   RDW 14.6 10/16/2020 0348   LYMPHSABS 1.8 10/13/2020 2200   MONOABS 0.7 10/13/2020 2200   EOSABS 0.1 10/13/2020 2200   BASOSABS 0.1 10/13/2020 2200    BMET    Component Value Date/Time   NA 138 10/15/2020 0225   K 3.6 10/15/2020 0225   CL 106 10/15/2020 0225   CO2 23 10/15/2020 0225   GLUCOSE 157 (H) 10/15/2020 0225   BUN 15 10/15/2020 0225   CREATININE 0.67 10/15/2020 0225   CALCIUM 9.9 10/15/2020 0225   GFRNONAA >60 10/15/2020 0225   GFRAA  07/12/2010 0833    >60        The eGFR has been calculated using the MDRD equation. This calculation has not been validated in all clinical situations. eGFR's persistently <60 mL/min signify possible Chronic Kidney Disease.     Intake/Output Summary (Last 24 hours) at 10/16/2020 1543 Last data filed at 10/16/2020 1513 Gross per 24 hour  Intake 920 ml  Output 1500 ml  Net -580 ml    HOSPITAL MEDICATIONS Scheduled Meds:  aspirin EC  81 mg Oral Q breakfast   atorvastatin  20 mg Oral Daily   metoprolol tartrate  50 mg Oral BID   pantoprazole  40 mg Oral Daily   Continuous Infusions: PRN Meds:.albuterol, labetalol  Assessment and Plan: 65 y.o. female with asymptomatic carotid artery  stenosis as well as retrograde left vertebral artery blood flow. We will make arrangement for outpatient CTA of head and neck and follow-up appointment with Dr. Oneida Alar in the next several weeks. Follow-up carotid duplex in 6 months.     Patient has mild coronary artery calcification and mild nonobstructive epicardial coronary artery disease per CTA. Medical management per cardiology  Risa Grill, PA-C [facility] [phone] 10/16/2020  3:43 PM

## 2020-10-16 NOTE — Progress Notes (Addendum)
Subjective:  Patient resting comfortably this morning with family present at bedside at time of exam.  Heparin infusion has been discontinued. Patient remains chest pain-free and has had no recurrence of shortness of breath.   Denies dizziness, orthopnea. Bilateral carotid artery stenosis as well as left subclavian artery stenosis.  Patient does report fatigue of her left arm, particularly when holding something for extended periods of time.  Patient was evaluated by vascular surgery yesterday who recommends follow-up CTA scan of head and neck which can be done inpatient or outpatient as well as carotid surveillance in 6 months. No recurrence of intermittent left bundle branch block on telemetry reviewed at time of exam.  Coronary CTA revealed non-obstructive CAD.   Intake/Output from previous day:  I/O last 3 completed shifts: In: 972.9 [P.O.:720; I.V.:252.9] Out: 1100 [Urine:1100] Total I/O In: 220 [P.O.:220] Out: 700 [Urine:700]  Blood pressure 116/67, pulse 83, temperature 98.2 F (36.8 C), temperature source Oral, resp. rate 12, height 5' 1" (1.549 m), weight 82.6 kg, SpO2 98 %.  Physical Exam Vitals reviewed.  HENT:     Head: Normocephalic and atraumatic.  Cardiovascular:     Rate and Rhythm: Normal rate and regular rhythm.     Pulses: Intact distal pulses.          Carotid pulses are  on the right side with bruit and  on the left side with bruit.    Heart sounds: S1 normal and S2 normal. No murmur heard.   No gallop.  Pulmonary:     Effort: Pulmonary effort is normal. No respiratory distress.     Breath sounds: No wheezing, rhonchi or rales.  Abdominal:     General: Bowel sounds are normal.     Palpations: Abdomen is soft.  Musculoskeletal:     Right lower leg: No edema.     Left lower leg: No edema.  Skin:    General: Skin is warm and dry.  Neurological:     General: No focal deficit present.     Mental Status: She is alert and oriented to person, place, and time.     Lab Results: BMP BNP (last 3 results) Recent Labs    10/15/20 0225  BNP 28.3    ProBNP (last 3 results) No results for input(s): PROBNP in the last 8760 hours. BMP Latest Ref Rng & Units 10/15/2020 10/14/2020 10/13/2020  Glucose 70 - 99 mg/dL 157(H) 123(H) 115(H)  BUN 8 - 23 mg/dL _0 Creatinine 0.44 - 1.00 mg/dL 0.67 0.62 0.75  Sodium 135 - 145 mmol/L 138 137 136  Potassium 3.5 - 5.1 mmol/L 3.6 3.8 3.5  Chloride 98 - 111 mmol/L 106 105 102  CO2 22 - 32 mmol/L _1 Calcium 8.9 - 10.3 mg/dL 9.9 9.7 10.2   Hepatic Function Latest Ref Rng & Units 10/14/2020  Total Protein 6.5 - 8.1 g/dL 7.4  Albumin 3.5 - 5.0 g/dL 3.7  AST 15 - 41 U/L 33  ALT 0 - 44 U/L 20  Alk Phosphatase 38 - 126 U/L 85  Total Bilirubin 0.3 - 1.2 mg/dL 0.4   CBC Latest Ref Rng & Units 10/16/2020 10/15/2020 10/14/2020  WBC 4.0 - 10.5 K/uL 7.5 7.2 8.1  Hemoglobin 12.0 - 15.0 g/dL 10.7(L) 11.0(L) 12.6  Hematocrit 36.0 - 46.0 % 33.9(L) 33.9(L) 39.8  Platelets 150 - 400 K/uL 242 242 276   Lipid Panel     Component Value Date/Time   CHOL 138 10/15/2020 0225  TRIG 180 (H) 10/15/2020 0225   HDL 43 10/15/2020 0225   CHOLHDL 3.2 10/15/2020 0225   VLDL 36 10/15/2020 0225   LDLCALC 59 10/15/2020 0225   Cardiac Panel (last 3 results) No results for input(s): CKTOTAL, CKMB, TROPONINI, RELINDX in the last 72 hours.  HEMOGLOBIN A1C Lab Results  Component Value Date   HGBA1C 6.2 (H) 10/15/2020   MPG 131.24 10/15/2020   TSH No results for input(s): TSH in the last 8760 hours. Imaging: CT CORONARY MORPH W/CTA COR W/SCORE W/CA W/CM &/OR WO/CM  Result Date: 10/15/2020 EXAM: OVER-READ INTERPRETATION  CT CHEST The following report is an over-read performed by radiologist Dr. Rolm Baptise of [facility], [place] on 10/15/2020. This over-read does not include interpretation of cardiac or coronary anatomy or pathology. The coronary CTA interpretation by the cardiologist is attached. COMPARISON:  None.  FINDINGS: Vascular: Heart is normal size. Aorta normal caliber. Scattered calcifications in the descending thoracic aorta. Mediastinum/Nodes: No adenopathy Lungs/Pleura: No confluent opacities or effusions. Upper Abdomen: Imaging into the upper abdomen demonstrates no acute findings. Musculoskeletal: Chest wall soft tissues are unremarkable. No acute bony abnormality. IMPRESSION: No acute extra cardiac abnormality. Descending aortic atherosclerosis. Electronically Signed   By: Rolm Baptise M.D.   On: 10/15/2020 16:55   ECHOCARDIOGRAM COMPLETE  Result Date: 10/14/2020    ECHOCARDIOGRAM REPORT   Patient Name:   Carmen Morgan Date of Exam: 10/14/2020 Medical Rec #:  [id number]      Height:       61.0 in Accession #:    [id number]     Weight:       182.0 lb Date of Birth:  07-17-1955      BSA:          1.815 m Patient Age:    65 years       BP:           133/47 mmHg Patient Gender: F              HR:           85 bpm. Exam Location:  [facility] Procedure: 2D Echo, Cardiac Doppler and Color Doppler Indications:    R07.9* Chest pain, unspecified  History:        Patient has no prior history of Echocardiogram examinations.                 Risk Factors:Hypertension and GERD.  Sonographer:    Tiffany Dance Referring Phys: [id number] COURAGE EMOKPAE  Sonographer Comments: Technically difficult study due to poor echo windows. IMPRESSIONS  1. Left ventricular ejection fraction, by estimation, is 65 to 70%. The left ventricle has normal function. The left ventricle has no regional wall motion abnormalities. There is mild left ventricular hypertrophy. Left ventricular diastolic parameters were normal.  2. Right ventricular systolic function is normal. The right ventricular size is normal. Tricuspid regurgitation signal is inadequate for assessing PA pressure.  3. The mitral valve is grossly normal. Trivial mitral valve regurgitation. No evidence of mitral stenosis.  4. The aortic valve was not well visualized. Aortic valve  regurgitation is trivial. No aortic stenosis is present.  5. The inferior vena cava is normal in size with <50% respiratory variability, suggesting right atrial pressure of 8 mmHg. FINDINGS  Left Ventricle: Left ventricular ejection fraction, by estimation, is 65 to 70%. The left ventricle has normal function. The left ventricle has no regional wall motion abnormalities. The left ventricular internal cavity size was normal in size. There  is  mild left ventricular hypertrophy. Left ventricular diastolic parameters were normal. Right Ventricle: The right ventricular size is normal. No increase in right ventricular wall thickness. Right ventricular systolic function is normal. Tricuspid regurgitation signal is inadequate for assessing PA pressure. Left Atrium: Left atrial size was normal in size. Right Atrium: Right atrial size was normal in size. Pericardium: There is no evidence of pericardial effusion. Presence of pericardial fat pad. Mitral Valve: The mitral valve is grossly normal. Mild mitral annular calcification. Trivial mitral valve regurgitation. No evidence of mitral valve stenosis. Tricuspid Valve: The tricuspid valve is not well visualized. Tricuspid valve regurgitation is not demonstrated. No evidence of tricuspid stenosis. Aortic Valve: The aortic valve was not well visualized. Aortic valve regurgitation is trivial. No aortic stenosis is present. Pulmonic Valve: The pulmonic valve was not well visualized. Pulmonic valve regurgitation is not visualized. No evidence of pulmonic stenosis. Aorta: The aortic root is normal in size and structure. Venous: The inferior vena cava is normal in size with less than 50% respiratory variability, suggesting right atrial pressure of 8 mmHg. IAS/Shunts: No atrial level shunt detected by color flow Doppler.  LEFT VENTRICLE PLAX 2D LVIDd:         3.60 cm  Diastology LVIDs:         2.36 cm  LV e' medial:    11.10 cm/s LV PW:         1.13 cm  LV E/e' medial:  7.6 LV IVS:         1.06 cm  LV e' lateral:   8.94 cm/s LVOT diam:     1.70 cm  LV E/e' lateral: 9.4 LV SV:         48 LV SV Index:   26 LVOT Area:     2.27 cm  RIGHT VENTRICLE RV Basal diam:  2.46 cm RV S prime:     14.50 cm/s TAPSE (M-mode): 1.9 cm LEFT ATRIUM             Index       RIGHT ATRIUM           Index LA diam:        3.50 cm 1.93 cm/m  RA Area:     11.40 cm LA Vol (A2C):   29.3 ml 16.15 ml/m RA Volume:   22.90 ml  12.62 ml/m LA Vol (A4C):   33.9 ml 18.68 ml/m LA Biplane Vol: 30.9 ml 17.03 ml/m  AORTIC VALVE LVOT Vmax:   96.95 cm/s LVOT Vmean:  65.150 cm/s LVOT VTI:    0.212 m  AORTA Ao Root diam: 2.90 cm Ao Asc diam:  3.10 cm MITRAL VALVE MV Area (PHT): 3.77 cm    SHUNTS MV Decel Time: 201 msec    Systemic VTI:  0.21 m MV E velocity: 84.10 cm/s  Systemic Diam: 1.70 cm MV A velocity: 98.70 cm/s MV E/A ratio:  0.85 Cherlynn Kaiser MD Electronically signed by Cherlynn Kaiser MD Signature Date/Time: 10/14/2020/1:58:58 PM    Final    VAS US CAROTID  Result Date: 10/15/2020 Carotid Arterial Duplex Study Patient Name:  Carmen Morgan  Date of Exam:   10/15/2020 Medical Rec #: [id number]       Accession #:    [id number] Date of Birth: [birth year]       Patient Gender: F Patient Age:   64Y Exam Location:  [facility] Procedure:      VAS US CAROTID Referring Phys: [id number] Rex Kras --------------------------------------------------------------------------------  Indications:       Bilateral bruits. Risk Factors:      Hypertension, prior MI. Comparison Study:  Per patient, previous imaging at [facility] showed carotid                    stenosis approximately one year ago. Patient endorses no                    follow up in the interim. Results not available. Performing Technologist: Darlin Coco RDMS,RVT  Examination Guidelines: A complete evaluation includes B-mode imaging, spectral Doppler, color Doppler, and power Doppler as needed of all accessible portions of each vessel. Bilateral testing is considered an  integral part of a complete examination. Limited examinations for reoccurring indications may be performed as noted.  Right Carotid Findings: +----------+--------+--------+--------+------------------------------+---------+           PSV cm/sEDV cm/sStenosisPlaque Description            Comments  +----------+--------+--------+--------+------------------------------+---------+ CCA Prox  89      23                                            turbulent +----------+--------+--------+--------+------------------------------+---------+ CCA Distal91      27                                                      +----------+--------+--------+--------+------------------------------+---------+ ICA Prox  204     67      60-79%  calcific, heterogenous and                                                irregular                               +----------+--------+--------+--------+------------------------------+---------+ ICA Mid   176     52                                                      +----------+--------+--------+--------+------------------------------+---------+ ICA Distal144     47                                                      +----------+--------+--------+--------+------------------------------+---------+ ECA       221     36      >50%    calcific, heterogenous and                                                irregular                               +----------+--------+--------+--------+------------------------------+---------+ +----------+--------+-------+--------------+-------------------+  PSV cm/sEDV cmsDescribe      Arm Pressure (mmHG) +----------+--------+-------+--------------+-------------------+ Subclavian202            Increased flow                    +----------+--------+-------+--------------+-------------------+ +---------+--------+--+--------+--+---------+ VertebralPSV cm/s87EDV cm/s21Antegrade  +---------+--------+--+--------+--+---------+  Left Carotid Findings: +----------+--------+--------+--------+---------------------+------------------+           PSV cm/sEDV cm/sStenosisPlaque Description   Comments           +----------+--------+--------+--------+---------------------+------------------+ CCA Prox  120     30                                                      +----------+--------+--------+--------+---------------------+------------------+ CCA Mid   120     42      <50%    focal, smooth and                                                         hyperechoic                             +----------+--------+--------+--------+---------------------+------------------+ CCA Distal98      25                                   intimal thickening +----------+--------+--------+--------+---------------------+------------------+ ICA Prox  162     49      40-59%  heterogenous                            +----------+--------+--------+--------+---------------------+------------------+ ICA Mid   115     35                                                      +----------+--------+--------+--------+---------------------+------------------+ ICA Distal135     43                                                      +----------+--------+--------+--------+---------------------+------------------+ ECA       127     19                                                      +----------+--------+--------+--------+---------------------+------------------+ +----------+--------+--------+------------------------+-------------------+           PSV cm/sEDV cm/sDescribe                Arm Pressure (mmHG) +----------+--------+--------+------------------------+-------------------+ Subclavian175             Post-stenotic turbulence                    +----------+--------+--------+------------------------+-------------------+  +---------+--------+--+--------+----------+  VertebralPSV cm/s76EDV cm/sRetrograde +---------+--------+--+--------+----------+   Summary: Right Carotid: Velocities in the right ICA are consistent with a 60-79%                stenosis. The ECA appears >50% stenosed. Left Carotid: Velocities in the left ICA are consistent with a 40-59% stenosis.               Non-hemodynamically significant plaque <50% noted in the CCA. Vertebrals:  Right vertebral artery demonstrates antegrade flow. Left vertebral              artery demonstrates retrograde flow. Subclavians: Left subclavian artery was stenotic. Normal flow hemodynamics were              seen in the right subclavian artery. *See table(s) above for measurements and observations.  Electronically signed by Ruta Hinds MD on 10/15/2020 at 3:29:23 PM.    Final     Cardiac Studies: EKG: 10/13/2020: Sinus tachycardia, 102 bpm, left bundle branch block, ST-T changes in the inferolateral leads, without underlying injury pattern.  No prior EKGs available for comparison.   10/14/2020: Normal sinus rhythm, 81 bpm, old anteroseptal infarct, minimal ST-T changes, without underlying injury pattern.  Compared to prior EKG absence of left bundle branch block.   Echocardiogram: 10/14/2020 1. Left ventricular ejection fraction, by estimation, is 65 to 70%. The  left ventricle has normal function. The left ventricle has no regional  wall motion abnormalities. There is mild left ventricular hypertrophy.  Left ventricular diastolic parameters were normal.   2. Right ventricular systolic function is normal. The right ventricular  size is normal. Tricuspid regurgitation signal is inadequate for assessing  PA pressure.   3. The mitral valve is grossly normal. Trivial mitral valve  regurgitation. No evidence of mitral stenosis.   4. The aortic valve was not well visualized. Aortic valve regurgitation  is trivial. No aortic stenosis is present.   5. The inferior vena  cava is normal in size with <50% respiratory  variability, suggesting right atrial pressure of 8 mmHg.  Carotid Duplex 10/15/2020:  Right Carotid: Velocities in the right ICA are consistent with a 60-79% stenosis. The ECA appears >50% stenosed.  Left Carotid: Velocities in the left ICA are consistent with a 40-59%  stenosis. Non-hemodynamically significant plaque <50% noted in the CCA.  Vertebrals:  Right vertebral artery demonstrates antegrade flow. Left vertebral artery demonstrates retrograde flow.  Subclavians: Left subclavian artery was stenotic. Normal flow hemodynamics  were seen in the right subclavian artery.   Coronary CTA 10/15/2020: 1. Total coronary calcium score of 14.1. This was 60th percentile for age and sex matched control. 2. Normal coronary origin with right dominance. 3. CAD-RADS = 2. Minimal stenosis (0-24%) due to noncalcified plaque in the proximal LAD. Mild stenosis (25-49%) within the mid LCx due to mixed plaque. Minimal stenosis (0-24%) due to mixed plaque in proximal RCA. 4. Aortic atherosclerosis within descending aorta.   RECOMMENDATIONS: Consider non-atherosclerotic causes of chest pain. Consider preventive therapy and risk factor modification.  Telemetry:  Normal sinus rhythm  Scheduled Meds:  aspirin EC  81 mg Oral Q breakfast   atorvastatin  20 mg Oral Daily   metoprolol tartrate  50 mg Oral BID   pantoprazole  40 mg Oral Daily   Continuous Infusions:   PRN Meds:.albuterol, labetalol  Assessment/Plan:  Non-STEMI On presentation patient had cardiac chest discomfort, EKG changes, and troponin elevation.   Was on IV heparin drip for approximately 48 hours. Now chest pain-free. Decided  to undergo ischemic evaluation and patient chose to proceed with coronary CTA. Coronary CTA revealed nonobstructive CAD, coronary calcium score 14.1 placing patient 60th percentile for age and sex. Recommend outpatient follow-up with cardiology. Continue aspirin,  atorvastatin, Lopressor.   Benign essential hypertension: Blood pressure well controlled with Lopressor.  Continue Lopressor   Bilateral carotid artery stenosis:  Continue aspirin and statin therapy. Repeat carotid artery duplex in 6 months Being followed by vascular surgery, we will also continue to follow on an outpatient basis. Vascular surgery considering CTA head and neck either during this hospitalization or as outpatient   Left subclavian artery stenosis: Continue aspirin and statin therapy Being followed by vascular surgery, we will also continue to follow on an outpatient basis Vascular surgery is considering CTA head and neck prior to discharge, will defer further management to vascular surgery team at this time.   Intermittent left bundle branch block:  Continue to monitor.  Prediabetes:  Will defer management to primary team    Currently on hormonal placement therapy:  Will defer management to primary team  From cardiovascular standpoint patient is stable for discharge with outpatient follow up.    Patient was seen in collaboration with Dr. Terri Skains. He also reviewed patient's chart and examined the patient. Dr. Terri Skains is in agreement of the plan.      Alethia Berthold, PA-C 10/16/2020, 12:29 PM Office: [phone]  ADDENDUM:   Shared Visit: Patient was independently interviewed and examined by me. This has been a shared visit with Lawerance Cruel, PA in consultation with Dr. Terri Skains. I reviewed the above and agree the findings and recommendations, unless noted differently below.   Patient is accompanied by her husband at the time of morning rounds. She denies any chest pain or shortness of breath at rest or with effort related activities. No events overnight.   PHYSICAL EXAM: Vitals with BMI 10/16/2020 10/16/2020 10/15/2020  Height - 5' 1" -  Weight - 182 lbs -  BMI - 33.74 -  Systolic 451 460 479  Diastolic 67 59 67  Pulse 83 80 87   CONSTITUTIONAL:  Well-developed and well-nourished. No acute distress.  CHEST Normal respiratory effort. No intercostal retractions  LUNGS: Clear to auscultation bilaterally.  No stridor. No wheezes. No rales.  CARDIOVASCULAR: Regular, positive S1-S2, no murmurs rubs or gallops appreciated, bilateral carotid bruits ABDOMINAL: Soft, nontender, distended, positive bowel sounds in all 4 quadrants, no apparent ascites.  EXTREMITIES: No peripheral edema  HEMATOLOGIC: No significant bruising NEUROLOGIC: Oriented to person, place, and time. Nonfocal. Normal muscle tone.     Assessment/Plan: Non-STEMI: Patient presented to the hospital with symptoms suggestive of angina pectoris, mild troponin leak, and ST-T changes secondary to intermittent left bundle branch block. Given her symptoms recommended to undergo an ischemic evaluation with either left heart catheterization with possible intervention or coronary CTA as an alternative. Patient decided to proceed with coronary CTA.  Patient has mild coronary artery calcification and mild nonobstructive epicardial coronary artery disease per CTA. Recommend lifestyle changes to improve her modifiable cardiovascular risk factors. Continue with aspirin and statin therapy. Echocardiogram results reviewed. She is currently asymptomatic and relatively stable to be discharged home with outpatient follow-up with cardiology.  Discharge paperwork updated.    Further recommendations to follow as the case evolves.   This note was created using a voice recognition software as a result there may be grammatical errors inadvertently enclosed that do not reflect the nature of this encounter. Every attempt is made to correct such  errors.   Rex Kras, [place], [facility]  Pager: [phone] Office: [phone]

## 2020-10-16 NOTE — Plan of Care (Signed)
Nutrition Education Note  RD consulted for nutrition education regarding CHF and diabetes.  Lab Results  Component Value Date   HGBA1C 6.2 (H) 10/15/2020   Spoke with pt and husband at bedside. Pt reports "I have a great appetite and love to eat". Pt shares that she does a lot of cooking at home and also snacks frequently. She has a strong family history of DM and is trying to prolong DM diagnosis and taking medications.   Pt reports that she typically consumes 3 meals per day (Breakfast: BBQ sandwich; Lunch: shrimp and sausage; Dinner: meat, starch, and vegetable). Pt also snacks in the evening or in the night on brownie brittle. She consumes mainly diet soda. Spent a long time discussing alternative food choices for meal ideas, beverages, and snacking.   Pt is also interested in pursing physical activity; she shares she has gained a significant amount of weight over the past 23 years due to a sedentary lifestyle. Discussed potential physical activity options, but also deferred to MD to ensure pt is medically able to pursue physical activity after discharge.   RD provided "Heart Healthy, Consistent Carbohydrate Nutrition Therapy" handout from the [employer] of [employer]. Reviewed patient's dietary recall. Provided examples on ways to decrease sodium intake in diet. Discouraged intake of processed foods and use of salt shaker. Encouraged fresh fruits and vegetables as well as whole grain sources of carbohydrates to maximize fiber intake.   RD discussed why it is important for patient to adhere to diet recommendations, and emphasized the role of fluids, foods to avoid, and importance of weighing self daily.   Discussed different food groups and their effects on blood sugar, emphasizing carbohydrate-containing foods. Provided list of carbohydrates and recommended serving sizes of common foods.  Discussed importance of controlled and consistent carbohydrate intake throughout the day.  Provided examples of ways to balance meals/snacks and encouraged intake of high-fiber, whole grain complex carbohydrates. Teach back method used.  Expect fair compliance.  Body mass index is 34.39 kg/m. Pt meets criteria for obesity, class I based on current BMI. Obesity is a complex, chronic medical condition that is optimally managed by a multidisciplinary care team. Weight loss is not an ideal goal for an acute inpatient hospitalization. However, if further work-up for obesity is warranted, consider outpatient referral to outpatient bariatric service and/or [facility].    Current diet order is Heart Healthy, patient is consuming approximately 100% of meals at this time. Labs and medications reviewed. No further nutrition interventions warranted at this time. RD contact information provided. If additional nutrition issues arise, please re-consult RD.   Loistine Chance, RD, LDN, [place] Registered Dietitian II Certified Diabetes Care and Education Specialist Please refer to [facility] for RD and/or RD on-call/weekend/after hours pager

## 2020-10-17 ENCOUNTER — Telehealth: Payer: Self-pay

## 2020-10-17 NOTE — Telephone Encounter (Signed)
Location of hospitalization: [facility] Reason for hospitalization: Chest pain Date of discharge: 10/16/2020 Date of first communication with patient: today Person contacting patient: Michail Sermon  Current symptoms: Patient stated she feels "much better." Do you understand why you were in the Hospital: Yes Questions regarding discharge instructions: None Where were you discharged to: Home Medications reviewed: Yes Allergies reviewed: Yes Dietary changes reviewed: No  Referals reviewed: NA Activities of Daily Living: No Any transportation issues/concerns: None Any patient concerns: None Confirmed importance & date/time of Follow up appt: Yes and to make sure to bring her medications with her.  Confirmed with patient if condition begins to worsen call. Pt was given the office number and encouraged to call back with questions or concerns: Yes

## 2020-10-31 ENCOUNTER — Telehealth: Payer: Self-pay

## 2020-10-31 NOTE — Telephone Encounter (Signed)
Pt called regarding Sob/swelling she thinks it is the metoprolol she was put on in the hospital. She is going to stop the metoprolol, her appointment is with you tomorrow.

## 2020-10-31 NOTE — Telephone Encounter (Signed)
Okay. Please have her bring in her medication bottles as well.

## 2020-11-01 ENCOUNTER — Encounter: Payer: Self-pay | Admitting: Cardiology

## 2020-11-01 ENCOUNTER — Ambulatory Visit: Payer: Medicare Other | Admitting: Cardiology

## 2020-11-01 ENCOUNTER — Other Ambulatory Visit: Payer: Self-pay

## 2020-11-01 VITALS — BP 144/71 | HR 73 | Temp 98.3°F | Ht 61.0 in | Wt 187.0 lb

## 2020-11-01 DIAGNOSIS — I2510 Atherosclerotic heart disease of native coronary artery without angina pectoris: Secondary | ICD-10-CM

## 2020-11-01 DIAGNOSIS — I6523 Occlusion and stenosis of bilateral carotid arteries: Secondary | ICD-10-CM

## 2020-11-01 DIAGNOSIS — I10 Essential (primary) hypertension: Secondary | ICD-10-CM

## 2020-11-01 DIAGNOSIS — I700 Atherosclerosis of aorta: Secondary | ICD-10-CM

## 2020-11-01 DIAGNOSIS — I252 Old myocardial infarction: Secondary | ICD-10-CM

## 2020-11-01 DIAGNOSIS — R0609 Other forms of dyspnea: Secondary | ICD-10-CM

## 2020-11-01 DIAGNOSIS — R0600 Dyspnea, unspecified: Secondary | ICD-10-CM

## 2020-11-01 DIAGNOSIS — I771 Stricture of artery: Secondary | ICD-10-CM

## 2020-11-01 MED ORDER — HYDROCHLOROTHIAZIDE 25 MG PO TABS: 25.0000 mg | ORAL_TABLET | ORAL | 0 refills | Status: DC

## 2020-11-01 MED ORDER — LISINOPRIL 20 MG PO TABS: 20.0000 mg | ORAL_TABLET | Freq: Every day | ORAL | 0 refills | Status: DC

## 2020-11-01 NOTE — Progress Notes (Signed)
Date:  11/01/2020   ID:  Carmen Morgan, DOB 12/11/1955, MRN [id number]  PCP:  Etter Sjogren, FNP  Cardiologist:  Rex Kras, DO, [facility] (established care 10/14/2020)  Chief Complaint  Patient presents with   New Patient (Initial Visit)   Hospitalization Follow-up   Shortness of Breath    HPI  Carmen Morgan is a 65 y.o. female who presents to the office with a chief complaint of " posthospitalization, for evaluation of shortness of breath." Patient's past medical history and cardiovascular risk factors include: Mild nonobstructive CAD, mild coronary artery calcification, history of COVID-19 infection (January 2020 and July 2022), hypertension, on hormone replacement therapy, former smoker, postmenopausal female, advanced age, obesity due to excess calories.  Patient was originally seen during her hospitalization back in July 2022 for chest pain evaluation and abnormal EKG.  She came to the hospital as she was experiencing substernal discomfort, 8 out of 10 in intensity, lasting for 45 minutes, at times occurring with activity and improving with resting.  She was found to have left bundle branch block on EKG that appears to be intermittent and ST-T changes.  Patient was treated as non-STEMI and underwent coronary CTA to evaluate for obstructive CAD.  Patient was found to have mild coronary artery calcification and mild nonobstructive CAD.  Her medications were uptitrated and discharged home safely and now she presents for follow-up.  Patient states that she is not having any chest pain at rest or with effort related activities.  But feels bloated and swollen she is attributing the this to no longer being on lisinopril/hydrochlorothiazide.  She is also stopped taking metoprolol because she thought it would be contributing to her shortness of breath.  FUNCTIONAL STATUS: No structured exercise program or daily routine.   ALLERGIES: Allergies  Allergen Reactions   Penicillins Hives    Did  it involve swelling of the face/tongue/throat, SOB, or low BP? No Did it involve sudden or severe rash/hives, skin peeling, or any reaction on the inside of your mouth or nose? No Did you need to seek medical attention at a hospital or doctor's office? No When did it last happen?      childhood allergy  If all above answers are "NO", may proceed with cephalosporin use.    Vitamin B12 Nausea And Vomiting   Codeine Palpitations    Irregular Heart Rate    MEDICATION LIST PRIOR TO VISIT: Current Meds  Medication Sig   acetaminophen (TYLENOL) 500 MG tablet Take 1,000 mg by mouth every 6 (six) hours as needed for moderate pain or headache.   ascorbic acid (VITAMIN C) 250 MG CHEW Chew 250 mg by mouth daily.   aspirin 81 MG EC tablet Take 1 tablet (81 mg total) by mouth daily with breakfast. Swallow whole.   atorvastatin (LIPITOR) 20 MG tablet Take 1 tablet (20 mg total) by mouth daily.   Cholecalciferol (VITAMIN D3) 50 MCG (2000 UT) TABS Take 2,000 Units by mouth daily.    estradiol (ESTRACE) 0.5 MG tablet Take 0.5 mg by mouth at bedtime.    fexofenadine (ALLEGRA) 180 MG tablet Take 180 mg by mouth daily.   fluticasone (FLONASE) 50 MCG/ACT nasal spray Place 2 sprays into both nostrils daily.    hydrochlorothiazide (HYDRODIURIL) 25 MG tablet Take 1 tablet (25 mg total) by mouth every morning.   lisinopril (ZESTRIL) 20 MG tablet Take 1 tablet (20 mg total) by mouth daily at 10 pm.   Melatonin 10 MG CAPS Take by mouth.  pantoprazole (PROTONIX) 40 MG tablet TAKE 1 TABLET BY MOUTH TWICE A DAY     PAST MEDICAL HISTORY: Past Medical History:  Diagnosis Date   Coronary atherosclerosis due to calcified coronary lesion    GERD (gastroesophageal reflux disease)    History of COVID-04 April 2018 and July 2022.   Hypertension    Nonobstructive atherosclerosis of coronary artery    Seasonal allergies     PAST SURGICAL HISTORY: Past Surgical History:  Procedure Laterality Date   ABDOMINAL  HYSTERECTOMY  2014   BACK SURGERY  2012   COLONOSCOPY N/A 02/16/2019   Procedure: COLONOSCOPY;  Surgeon: Daneil Dolin, MD;  Location: [facility];  Service: Endoscopy;  Laterality: N/A;  2:00   ESOPHAGOGASTRODUODENOSCOPY N/A 01/18/2020   Procedure: ESOPHAGOGASTRODUODENOSCOPY (EGD);  Surgeon: Daneil Dolin, MD;  Location: [facility];  Service: Endoscopy;  Laterality: N/A;  2:45pm   left breast cystectomy  [place] N/A 01/18/2020   Procedure: MALONEY DILATION;  Surgeon: Daneil Dolin, MD;  Location: [facility];  Service: Endoscopy;  Laterality: N/A;   VEIN SURGERY  2013    FAMILY HISTORY: The patient family history includes Arthritis in her mother; CAD in her mother and sister; COPD in her sister; Cancer in her father; Diabetes in her mother and sister; Heart attack in her mother; Hypertension in her father; Testicular cancer in her brother.  SOCIAL HISTORY:  The patient  reports that she has quit smoking. She has never used smokeless tobacco. She reports that she does not drink alcohol and does not use drugs.  REVIEW OF SYSTEMS: Review of Systems  Constitutional: Negative for chills and fever.  HENT:  Negative for hoarse voice and nosebleeds.   Eyes:  Negative for discharge, double vision and pain.  Cardiovascular:  Positive for dyspnea on exertion and leg swelling. Negative for chest pain, claudication, near-syncope, orthopnea, palpitations, paroxysmal nocturnal dyspnea and syncope.  Respiratory:  Positive for shortness of breath. Negative for hemoptysis.   Musculoskeletal:  Negative for muscle cramps and myalgias.  Gastrointestinal:  Negative for abdominal pain, constipation, diarrhea, hematemesis, hematochezia, melena, nausea and vomiting.  Neurological:  Negative for dizziness and light-headedness.   PHYSICAL EXAM: Vitals with BMI 11/01/2020 10/16/2020 10/16/2020  Height '5\' 1"'$  - '5\' 1"'$   Weight 187 lbs - 182 lbs  BMI A999333 - A999333  Systolic 123456 [id number]   Diastolic 71 67 59  Pulse 73 83 80    CONSTITUTIONAL: Well-developed and well-nourished. No acute distress.  SKIN: Skin is warm and dry. No rash noted. No cyanosis. No pallor. No jaundice HEAD: Normocephalic and atraumatic.  EYES: No scleral icterus MOUTH/THROAT: Moist oral membranes.  NECK: No JVD present. No thyromegaly noted.  Bilateral carotid bruits  LYMPHATIC: No visible cervical adenopathy.  CHEST Normal respiratory effort. No intercostal retractions  LUNGS: Clear to auscultation bilaterally.  No stridor. No wheezes. No rales.  CARDIOVASCULAR: Regular, positive S1-S2, no murmurs rubs or gallops appreciated. ABDOMINAL: Obese, soft, nontender, nondistended, positive bowel sounds in all 4 quadrants no apparent ascites.  EXTREMITIES: Left lower extremity edema 2+, right lower extremity edema 1+, warm to touch, difficult to appreciate DP and PT pulses secondary to swelling. HEMATOLOGIC: No significant bruising NEUROLOGIC: Oriented to person, place, and time. Nonfocal. Normal muscle tone.  PSYCHIATRIC: Normal mood and affect. Normal behavior. Cooperative  CARDIAC DATABASE: EKG: 10/13/2020: Sinus tachycardia, 102 bpm, left bundle branch block, ST-T changes in the inferolateral leads, without underlying injury pattern.  No prior EKGs available for comparison.   10/14/2020: Normal sinus rhythm, 81 bpm, old anteroseptal infarct, minimal ST-T changes, without underlying injury pattern.  Compared to prior EKG absence of left bundle branch block.  11/01/2020: Normal sinus rhythm, 85 bpm, left bundle branch block.  Echocardiogram: 10/14/2020: LVEF 65-70%, no regional wall motion abnormalities, mild LVH, trivial MR.  Stress test: None  Heart catheterization: None  Carotid Duplex 10/15/2020:  Right Carotid: Velocities in the right ICA are consistent with a 60-79% stenosis. The ECA appears >50% stenosed.  Left Carotid: Velocities in the left ICA are consistent with a 40-59%  stenosis.  Non-hemodynamically significant plaque <50% noted in the CCA.  Vertebrals:  Right vertebral artery demonstrates antegrade flow. Left vertebral artery demonstrates retrograde flow.  Subclavians: Left subclavian artery was stenotic. Normal flow hemodynamics  were seen in the right subclavian artery.    Coronary CTA 10/15/2020: 1. Total coronary calcium score of 14.1. This was 60th percentile for age and sex matched control. 2. Normal coronary origin with right dominance. 3. CAD-RADS = 2. Minimal stenosis (0-24%) due to noncalcified plaque in the proximal LAD. Mild stenosis (25-49%) within the mid LCx due to mixed plaque. Minimal stenosis (0-24%) due to mixed plaque in proximal RCA. 4. Aortic atherosclerosis within descending aorta.  LABORATORY DATA: CBC Latest Ref Rng & Units 10/16/2020 10/15/2020 10/14/2020  WBC 4.0 - 10.5 K/uL 7.5 7.2 8.1  Hemoglobin 12.0 - 15.0 g/dL 10.7(L) 11.0(L) 12.6  Hematocrit 36.0 - 46.0 % 33.9(L) 33.9(L) 39.8  Platelets 150 - 400 K/uL 242 242 276    CMP Latest Ref Rng & Units 10/15/2020 10/14/2020 10/13/2020  Glucose 70 - 99 mg/dL 157(H) 123(H) 115(H)  BUN 8 - 23 mg/dL '15 17 23  '$ Creatinine 0.44 - 1.00 mg/dL 0.67 0.62 0.75  Sodium 135 - 145 mmol/L 138 137 136  Potassium 3.5 - 5.1 mmol/L 3.6 3.8 3.5  Chloride 98 - 111 mmol/L 106 105 102  CO2 22 - 32 mmol/L '23 24 25  '$ Calcium 8.9 - 10.3 mg/dL 9.9 9.7 10.2  Total Protein 6.5 - 8.1 g/dL - 7.4 -  Total Bilirubin 0.3 - 1.2 mg/dL - 0.4 -  Alkaline Phos 38 - 126 U/L - 85 -  AST 15 - 41 U/L - 33 -  ALT 0 - 44 U/L - 20 -    Lipid Panel     Component Value Date/Time   CHOL 138 10/15/2020 0225   TRIG 180 (H) 10/15/2020 0225   HDL 43 10/15/2020 0225   CHOLHDL 3.2 10/15/2020 0225   VLDL 36 10/15/2020 0225   LDLCALC 59 10/15/2020 0225    No components found for: NTPROBNP No results for input(s): PROBNP in the last 8760 hours. No results for input(s): TSH in the last 8760 hours.  BMP Recent Labs    10/13/20 2200  10/14/20 0300 10/15/20 0225  NA 136 137 138  K 3.5 3.8 3.6  CL 102 105 106  CO2 '25 24 23  '$ GLUCOSE 115* 123* 157*  BUN '23 17 15  '$ CREATININE 0.75 0.62 0.67  CALCIUM 10.2 9.7 9.9  GFRNONAA >60 >60 >60    HEMOGLOBIN A1C Lab Results  Component Value Date   HGBA1C 6.2 (H) 10/15/2020   MPG 131.24 10/15/2020    IMPRESSION:    ICD-10-CM   1. Dyspnea on exertion  123456 Basic metabolic panel    Magnesium    2. Coronary atherosclerosis due to calcified coronary lesion  I25.10    I25.84  3. Nonobstructive atherosclerosis of coronary artery  I25.10     4. Aortic atherosclerosis (HCC)  I70.0     5. Hx of non-ST elevation myocardial infarction (NSTEMI)  I25.2 EKG 12-Lead    6. Benign hypertension  I10 lisinopril (ZESTRIL) 20 MG tablet    hydrochlorothiazide (HYDRODIURIL) 25 MG tablet    Basic metabolic panel    Magnesium    7. Bilateral carotid artery stenosis  I65.23     8. Stenosis of left subclavian artery (HCC)  I77.1        RECOMMENDATIONS: JANYE VANDEVEN is a 65 y.o. female whose past medical history and cardiac risk factors include: Mild nonobstructive CAD, mild coronary artery calcification, history of COVID-19 infection (January 2020 and July 2022), hypertension, on hormone replacement therapy, former smoker, postmenopausal female, advanced age, obesity due to excess calories.  Dyspnea on exertion Clinically not in congestive heart failure. Discontinue metoprolol Will restart lisinopril 20 mg p.o. every afternoon. Will restart HCTZ at 25 mg p.o. every morning Blood work in 1 week to evaluate kidney function and electrolytes Recently underwent an echocardiogram and coronary CTA results reviewed and noted above for further reference.  Nonobstructive atherosclerosis of coronary artery & Coronary atherosclerosis due to calcified coronary lesion without angina pectoris: Continue aspirin and statin therapy. Secondary prevention discussed. Recently has undergone an  ischemic evaluation as discussed above.  Benign hypertension Office blood pressures are currently not at goal. Medication changes as discussed above. Continue to monitor.  Bilateral carotid artery stenosis and left subclavian artery stenosis: Currently on aspirin and statin therapy Follows up with vascular surgery.  FINAL MEDICATION LIST END OF ENCOUNTER: Meds ordered this encounter  Medications   lisinopril (ZESTRIL) 20 MG tablet    Sig: Take 1 tablet (20 mg total) by mouth daily at 10 pm.    Dispense:  90 tablet    Refill:  0   hydrochlorothiazide (HYDRODIURIL) 25 MG tablet    Sig: Take 1 tablet (25 mg total) by mouth every morning.    Dispense:  90 tablet    Refill:  0    Medications Discontinued During This Encounter  Medication Reason   metoprolol tartrate (LOPRESSOR) 50 MG tablet Patient Preference     Current Outpatient Medications:    acetaminophen (TYLENOL) 500 MG tablet, Take 1,000 mg by mouth every 6 (six) hours as needed for moderate pain or headache., Disp: , Rfl:    ascorbic acid (VITAMIN C) 250 MG CHEW, Chew 250 mg by mouth daily., Disp: , Rfl:    aspirin 81 MG EC tablet, Take 1 tablet (81 mg total) by mouth daily with breakfast. Swallow whole., Disp: 30 tablet, Rfl: 11   atorvastatin (LIPITOR) 20 MG tablet, Take 1 tablet (20 mg total) by mouth daily., Disp: 30 tablet, Rfl: 3   Cholecalciferol (VITAMIN D3) 50 MCG (2000 UT) TABS, Take 2,000 Units by mouth daily. , Disp: , Rfl:    estradiol (ESTRACE) 0.5 MG tablet, Take 0.5 mg by mouth at bedtime. , Disp: , Rfl:    fexofenadine (ALLEGRA) 180 MG tablet, Take 180 mg by mouth daily., Disp: , Rfl:    fluticasone (FLONASE) 50 MCG/ACT nasal spray, Place 2 sprays into both nostrils daily. , Disp: , Rfl:    hydrochlorothiazide (HYDRODIURIL) 25 MG tablet, Take 1 tablet (25 mg total) by mouth every morning., Disp: 90 tablet, Rfl: 0   lisinopril (ZESTRIL) 20 MG tablet, Take 1 tablet (20 mg total) by mouth daily at 10 pm.,  Disp: 90  tablet, Rfl: 0   Melatonin 10 MG CAPS, Take by mouth., Disp: , Rfl:    pantoprazole (PROTONIX) 40 MG tablet, TAKE 1 TABLET BY MOUTH TWICE A DAY, Disp: 60 tablet, Rfl: 3  Orders Placed This Encounter  Procedures   Basic metabolic panel   Magnesium   EKG 12-Lead    There are no Patient Instructions on file for this visit.   --Continue cardiac medications as reconciled in final medication list. --Return in about 6 months (around 05/04/2021) for Follow up, Coronary artery calcification, Dyspnea. Or sooner if needed. --Continue follow-up with your primary care physician regarding the management of your other chronic comorbid conditions.  Patient's questions and concerns were addressed to her satisfaction. She voices understanding of the instructions provided during this encounter.   This note was created using a voice recognition software as a result there may be grammatical errors inadvertently enclosed that do not reflect the nature of this encounter. Every attempt is made to correct such errors.  Rex Kras, [place], [facility]  Pager: [phone] Office: [phone]

## 2020-11-02 ENCOUNTER — Encounter: Payer: Self-pay | Admitting: Vascular Surgery

## 2020-11-02 ENCOUNTER — Ambulatory Visit (INDEPENDENT_AMBULATORY_CARE_PROVIDER_SITE_OTHER): Payer: Medicare Other | Admitting: Vascular Surgery

## 2020-11-02 VITALS — BP 142/68 | HR 84 | Temp 98.2°F | Resp 20 | Ht 61.0 in | Wt 184.0 lb

## 2020-11-02 DIAGNOSIS — I6523 Occlusion and stenosis of bilateral carotid arteries: Secondary | ICD-10-CM

## 2020-11-02 DIAGNOSIS — I771 Stricture of artery: Secondary | ICD-10-CM | POA: Diagnosis not present

## 2020-11-02 MED ORDER — ATORVASTATIN CALCIUM 20 MG PO TABS: 20.0000 mg | ORAL_TABLET | Freq: Every day | ORAL | 11 refills | Status: DC

## 2020-11-02 NOTE — Progress Notes (Signed)
Patient ID: Carmen Morgan, female   DOB: 1955-07-19, 65 y.o.   MRN: [id number]  Reason for Consult: Follow-up   Referred by Etter Sjogren, FNP  Subjective:     HPI:  Carmen Morgan is a 65 y.o. female female with history of nonobstructive coronary artery atherosclerosis.  Risk factors include hypertension previous history of smoking.  She is nondiabetic does not have any family history and does not have hypercholesterolemia.  She is on aspirin and a statin.  She does not have any previous history of stroke, TIA or amaurosis.  She has no left upper extremity issues.  Past Medical History:  Diagnosis Date   Coronary atherosclerosis due to calcified coronary lesion    GERD (gastroesophageal reflux disease)    History of COVID-04 April 2018 and July 2022.   Hypertension    Nonobstructive atherosclerosis of coronary artery    Seasonal allergies    Family History  Problem Relation Age of Onset   Diabetes Mother    Heart attack Mother    CAD Mother    Arthritis Mother    Cancer Father    Hypertension Father    COPD Sister    CAD Sister    Diabetes Sister    Testicular cancer Brother    Colon cancer Neg Hx    Past Surgical History:  Procedure Laterality Date   ABDOMINAL HYSTERECTOMY  2014   BACK SURGERY  2012   COLONOSCOPY N/A 02/16/2019   Procedure: COLONOSCOPY;  Surgeon: Daneil Dolin, MD;  Location: [facility];  Service: Endoscopy;  Laterality: N/A;  2:00   ESOPHAGOGASTRODUODENOSCOPY N/A 01/18/2020   Procedure: ESOPHAGOGASTRODUODENOSCOPY (EGD);  Surgeon: Daneil Dolin, MD;  Location: [facility];  Service: Endoscopy;  Laterality: N/A;  2:45pm   left breast cystectomy  [place] N/A 01/18/2020   Procedure: MALONEY DILATION;  Surgeon: Daneil Dolin, MD;  Location: [facility];  Service: Endoscopy;  Laterality: N/A;   VEIN SURGERY  2013    Short Social History:  Social History   Tobacco Use   Smoking status: Former   Smokeless tobacco:  Never  Substance Use Topics   Alcohol use: Never    Allergies  Allergen Reactions   Penicillins Hives    Did it involve swelling of the face/tongue/throat, SOB, or low BP? No Did it involve sudden or severe rash/hives, skin peeling, or any reaction on the inside of your mouth or nose? No Did you need to seek medical attention at a hospital or doctor's office? No When did it last happen?      childhood allergy  If all above answers are "NO", may proceed with cephalosporin use.    Vitamin B12 Nausea And Vomiting   Codeine Palpitations    Irregular Heart Rate    Current Outpatient Medications  Medication Sig Dispense Refill   acetaminophen (TYLENOL) 500 MG tablet Take 1,000 mg by mouth every 6 (six) hours as needed for moderate pain or headache.     ascorbic acid (VITAMIN C) 250 MG CHEW Chew 250 mg by mouth daily.     aspirin 81 MG EC tablet Take 1 tablet (81 mg total) by mouth daily with breakfast. Swallow whole. 30 tablet 11   atorvastatin (LIPITOR) 20 MG tablet Take 1 tablet (20 mg total) by mouth daily. 30 tablet 3   Cholecalciferol (VITAMIN D3) 50 MCG (2000 UT) TABS Take 2,000 Units by mouth daily.  estradiol (ESTRACE) 0.5 MG tablet Take 0.5 mg by mouth at bedtime.      fexofenadine (ALLEGRA) 180 MG tablet Take 180 mg by mouth daily.     fluticasone (FLONASE) 50 MCG/ACT nasal spray Place 2 sprays into both nostrils daily.      hydrochlorothiazide (HYDRODIURIL) 25 MG tablet Take 1 tablet (25 mg total) by mouth every morning. 90 tablet 0   lisinopril (ZESTRIL) 20 MG tablet Take 1 tablet (20 mg total) by mouth daily at 10 pm. 90 tablet 0   Melatonin 10 MG CAPS Take by mouth.     pantoprazole (PROTONIX) 40 MG tablet TAKE 1 TABLET BY MOUTH TWICE A DAY 60 tablet 3   No current facility-administered medications for this visit.    Review of Systems  Constitutional:  Constitutional negative. HENT: HENT negative.  Eyes: Eyes negative.  Respiratory: Respiratory negative.   Cardiovascular: Cardiovascular negative.  GI: Gastrointestinal negative.  Musculoskeletal: Musculoskeletal negative.  Skin: Skin negative.  Neurological: Neurological negative. Hematologic: Hematologic/lymphatic negative.  Psychiatric: Psychiatric negative.       Objective:  Objective  Vitals:   11/02/20 1135  BP: (!) 142/68  Pulse: 84  Resp: 20  Temp: 98.2 F (36.8 C)  SpO2: 95%     Physical Exam HENT:     Head: Normocephalic.     Nose:     Comments: Wearing a mask Eyes:     Pupils: Pupils are equal, round, and reactive to light.  Neck:     Comments: Bilateral carotid bruit Cardiovascular:     Rate and Rhythm: Normal rate.     Pulses:          Radial pulses are 2+ on the right side and 2+ on the left side.     Comments: Left subclavian bruit Abdominal:     General: Abdomen is flat.     Palpations: Abdomen is soft. There is no mass.     Comments: No bruits  Musculoskeletal:     Cervical back: Normal range of motion and neck supple.  Neurological:     General: No focal deficit present.     Mental Status: She is alert.  Psychiatric:        Mood and Affect: Mood normal.        Behavior: Behavior normal.        Thought Content: Thought content normal.        Judgment: Judgment normal.    Data: Right Carotid Findings:  +----------+--------+--------+--------+------------------------------+-----  ----+            PSV cm/sEDV cm/sStenosisPlaque Description              Comments   +----------+--------+--------+--------+------------------------------+-----  ----+  CCA Prox  89      23                                              turbulent  +----------+--------+--------+--------+------------------------------+-----  ----+  CCA Distal91      27                                                        +----------+--------+--------+--------+------------------------------+-----  ----+  ICA Prox  [id number]  60-79%  calcific,  heterogenous and                                                  irregular                                 +----------+--------+--------+--------+------------------------------+-----  ----+  ICA Mid   176     52                                                        +----------+--------+--------+--------+------------------------------+-----  ----+  ICA Distal144     47                                                        +----------+--------+--------+--------+------------------------------+-----  ----+  ECA       221     36      >50%    calcific, heterogenous and                                                  irregular                                 +----------+--------+--------+--------+------------------------------+-----  ----+   +----------+--------+-------+--------------+-------------------+            PSV cm/sEDV cmsDescribe      Arm Pressure (mmHG)  +----------+--------+-------+--------------+-------------------+  [id number]            Increased flow                     +----------+--------+-------+--------------+-------------------+   +---------+--------+--+--------+--+---------+  VertebralPSV cm/s87EDV cm/s21Antegrade  +---------+--------+--+--------+--+---------+       Left Carotid Findings:  +----------+--------+--------+--------+---------------------+--------------  ----+            PSV cm/sEDV cm/sStenosisPlaque Description   Comments              +----------+--------+--------+--------+---------------------+--------------  ----+  CCA Prox  120     30                                                         +----------+--------+--------+--------+---------------------+--------------  ----+  CCA Mid   120     42      <50%    focal, smooth and  hyperechoic                                 +----------+--------+--------+--------+---------------------+--------------  ----+  CCA Distal98      25                                   intimal  thickening  +----------+--------+--------+--------+---------------------+--------------  ----+  ICA Prox  162     49      40-59%  heterogenous                               +----------+--------+--------+--------+---------------------+--------------  ----+  ICA Mid   115     35                                                         +----------+--------+--------+--------+---------------------+--------------  ----+  ICA Distal135     43                                                         +----------+--------+--------+--------+---------------------+--------------  ----+  ECA       127     19                                                         +----------+--------+--------+--------+---------------------+--------------  ----+   +----------+--------+--------+------------------------+-------------------+             PSV cm/sEDV cm/sDescribe                Arm Pressure  (mmHG)  +----------+--------+--------+------------------------+-------------------+   [id number]             Post-stenotic turbulence                      +----------+--------+--------+------------------------+-------------------+    +---------+--------+--+--------+----------+  VertebralPSV cm/s76EDV cm/sRetrograde  +---------+--------+--+--------+----------+      Summary:  Right Carotid: Velocities in the right ICA are consistent with a 60-79%  stenosis. The ECA appears >50% stenosed.   Left Carotid: Velocities in the left ICA are consistent with a 40-59%  stenosis. Non-hemodynamically significant plaque <50% noted in the  CCA.   Vertebrals:  Right vertebral artery demonstrates antegrade flow. Left  vertebral artery demonstrates retrograde flow.   Subclavians: Left subclavian artery was stenotic.  Normal flow hemodynamics  were seen in the right subclavian artery.      Assessment/Plan:    65 year old female with history of bilateral carotid artery stenosis and left subclavian artery stenosis.  She is asymptomatic from this standpoint.  She has bruits of the left subclavian artery as well as bilateral carotid arteries but is asymptomatic from both of these.  There is actually a palpable left radial pulse to suggest she has good collateralization.  She will continue aspirin I refilled statins for her  today.  She will follow-up in 1 year with repeat carotid duplex.      Waynetta Sandy MD [facility]

## 2020-11-08 ENCOUNTER — Other Ambulatory Visit: Payer: Self-pay | Admitting: Cardiology

## 2020-11-09 ENCOUNTER — Other Ambulatory Visit: Payer: Self-pay

## 2020-11-09 DIAGNOSIS — R0609 Other forms of dyspnea: Secondary | ICD-10-CM

## 2020-11-09 DIAGNOSIS — I10 Essential (primary) hypertension: Secondary | ICD-10-CM

## 2020-11-09 LAB — BASIC METABOLIC PANEL
BUN/Creatinine Ratio: 25 (ref 12–28)
BUN: 17 mg/dL (ref 8–27)
CO2: 23 mmol/L (ref 20–29)
Calcium: 10.1 mg/dL (ref 8.7–10.3)
Chloride: 103 mmol/L (ref 96–106)
Creatinine, Ser: 0.69 mg/dL (ref 0.57–1.00)
Glucose: 107 mg/dL — ABNORMAL HIGH (ref 65–99)
Potassium: 4.8 mmol/L (ref 3.5–5.2)
Sodium: 139 mmol/L (ref 134–144)
eGFR: 95 mL/min/{1.73_m2} (ref >59)

## 2020-11-09 LAB — MAGNESIUM: Magnesium: 1.9 mg/dL (ref 1.6–2.3)

## 2020-12-26 ENCOUNTER — Ambulatory Visit: Payer: Medicare Other | Admitting: Gastroenterology

## 2021-01-03 ENCOUNTER — Ambulatory Visit: Payer: Medicare Other | Admitting: Gastroenterology

## 2021-01-03 ENCOUNTER — Encounter: Payer: Self-pay | Admitting: Internal Medicine

## 2021-01-18 ENCOUNTER — Other Ambulatory Visit: Payer: Self-pay | Admitting: Neurosurgery

## 2021-01-18 DIAGNOSIS — G8929 Other chronic pain: Secondary | ICD-10-CM

## 2021-01-20 ENCOUNTER — Other Ambulatory Visit: Payer: Self-pay | Admitting: Cardiology

## 2021-01-20 DIAGNOSIS — I10 Essential (primary) hypertension: Secondary | ICD-10-CM

## 2021-02-15 ENCOUNTER — Other Ambulatory Visit: Payer: Self-pay

## 2021-02-15 ENCOUNTER — Ambulatory Visit
Admission: RE | Admit: 2021-02-15 | Discharge: 2021-02-15 | Disposition: A | Discharge status: Home / Self Care | Payer: Medicare Other | Source: Ambulatory Visit | Attending: Neurosurgery | Admitting: Neurosurgery

## 2021-02-15 DIAGNOSIS — G8929 Other chronic pain: Secondary | ICD-10-CM

## 2021-02-15 IMAGING — MR MR LUMBAR SPINE W/O CM
4 of 5 series · 18 of 48 positions shown · non-contrast
Comparison: MRI lumbar spine/[DATE]

CLINICAL DATA: Low back pain and right hip pain for 17 months.
Lumbar spine surgery [B5].

EXAM:
MRI LUMBAR SPINE WITHOUT CONTRAST
TECHNIQUE: Multiplanar, multisequence MR imaging of the lumbar spine was
performed. No intravenous contrast was administered.

[Series 6: T2 · sagittal · 4.0mm · 0.73mm/px · 6 of 15 slices shown (1 of 2)]
[im 1/15]
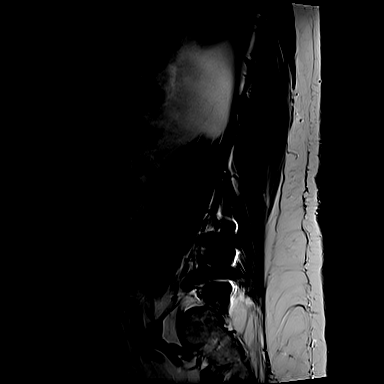
[im 3/15]
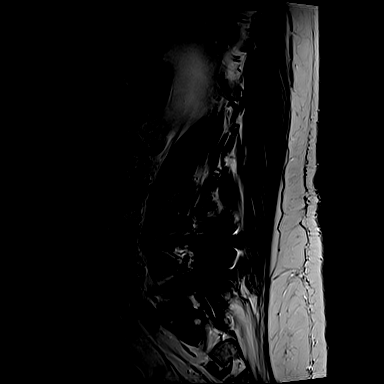
[im 6/15]
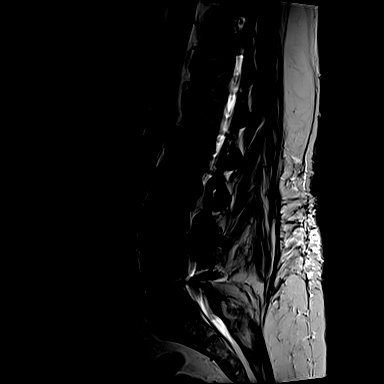
[im 9/15]
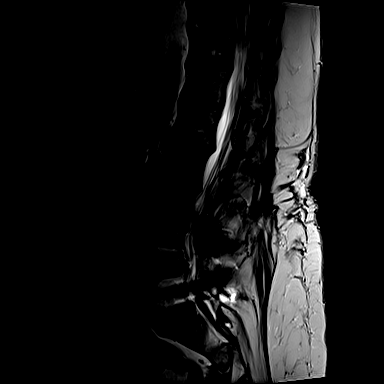
[im 12/15]
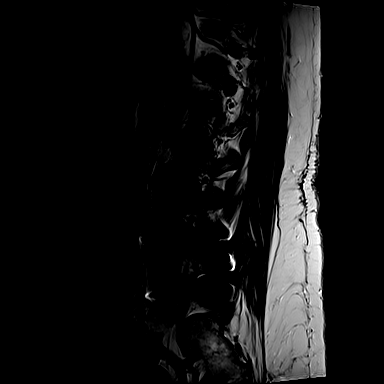
[im 15/15]
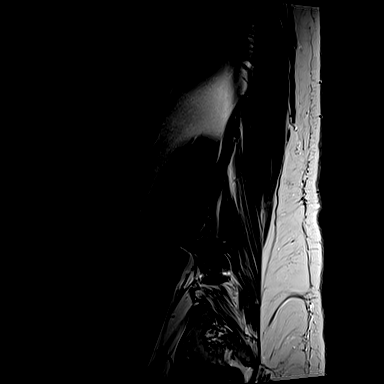

[Series 7: T1 · sagittal · 4.0mm · 0.73mm/px · 3 of 15 slices shown (1 of 2)]
[im 3/15]
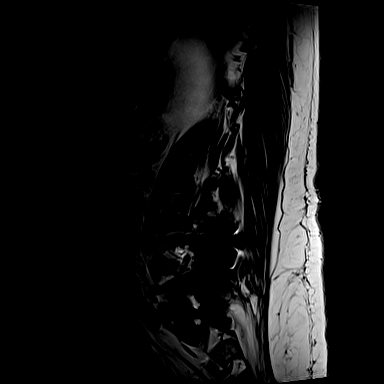
[im 9/15]
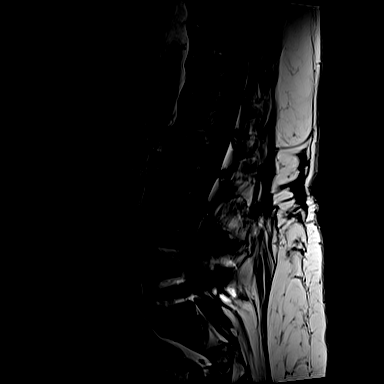
[im 15/15]
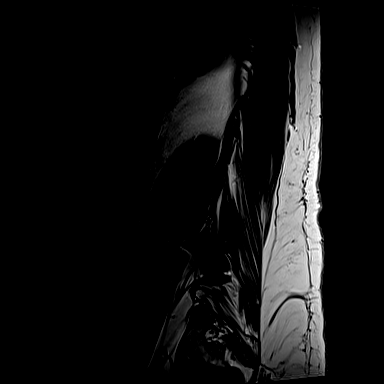

[Series 13: T2 · axial · 4.0mm · 0.28mm/px · z∈[-67,+108]mm · 6 of 39 slices shown (2 of 2)]
[im 1/39]
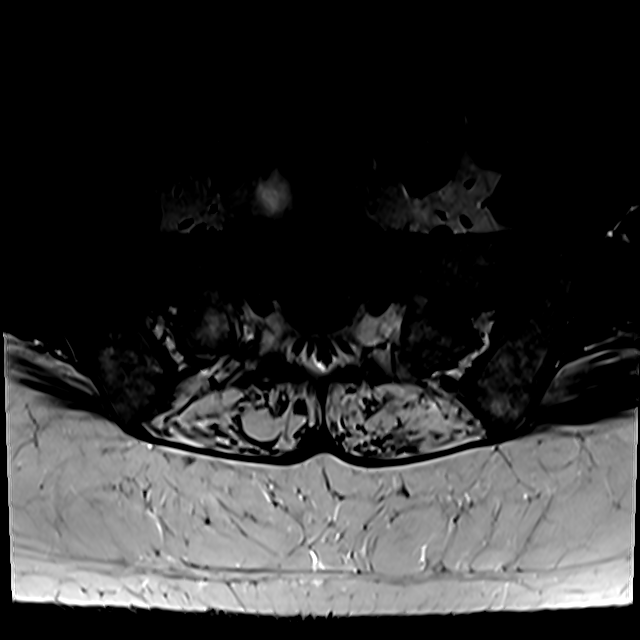
[im 6/39]
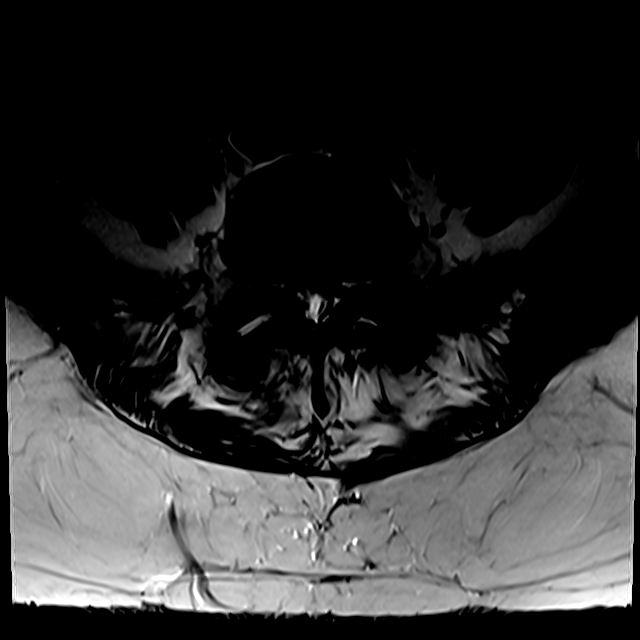
[im 11/39]
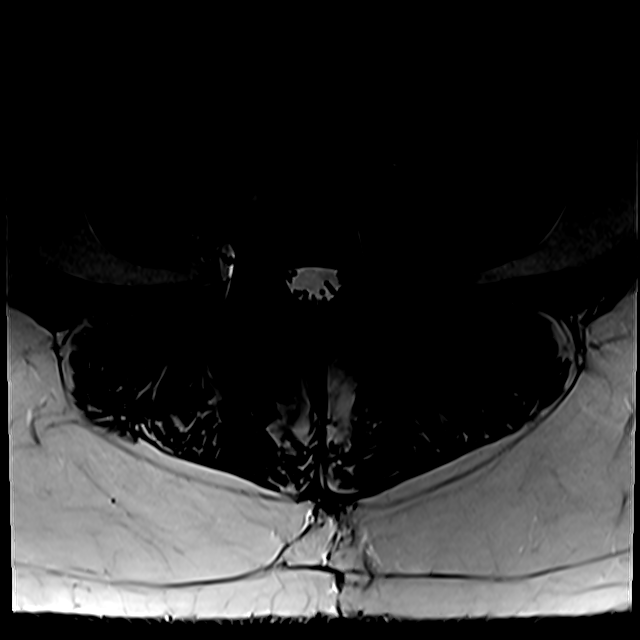
[im 17/39]
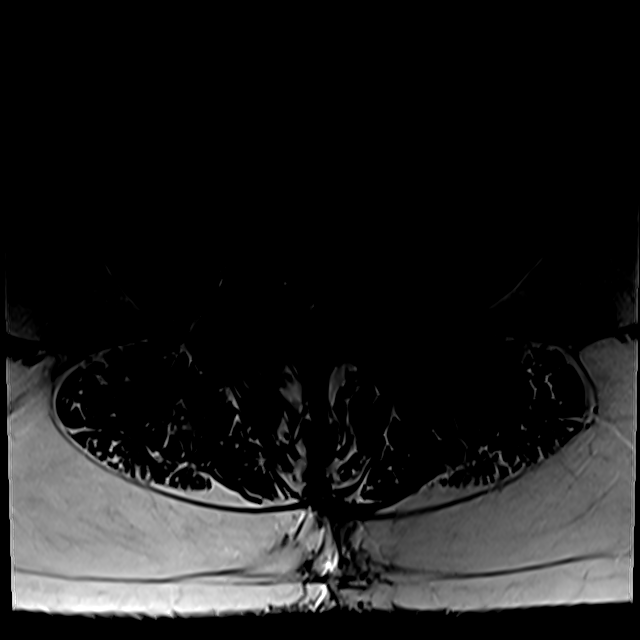
[im 20/39]
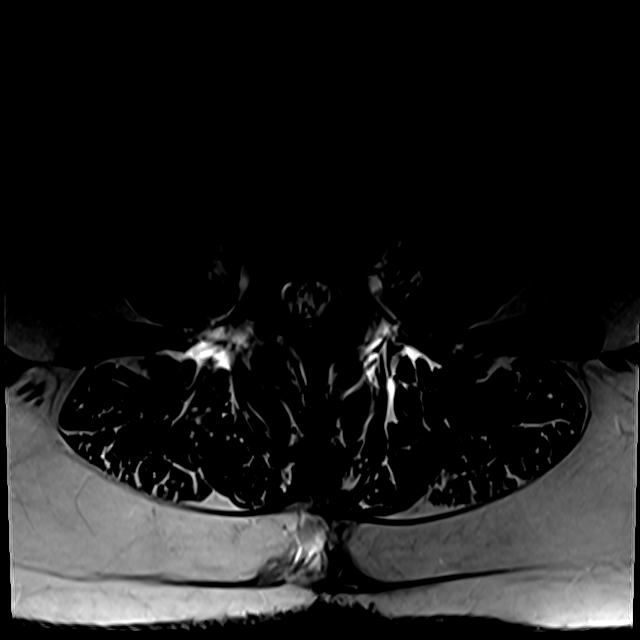
[im 33/39]
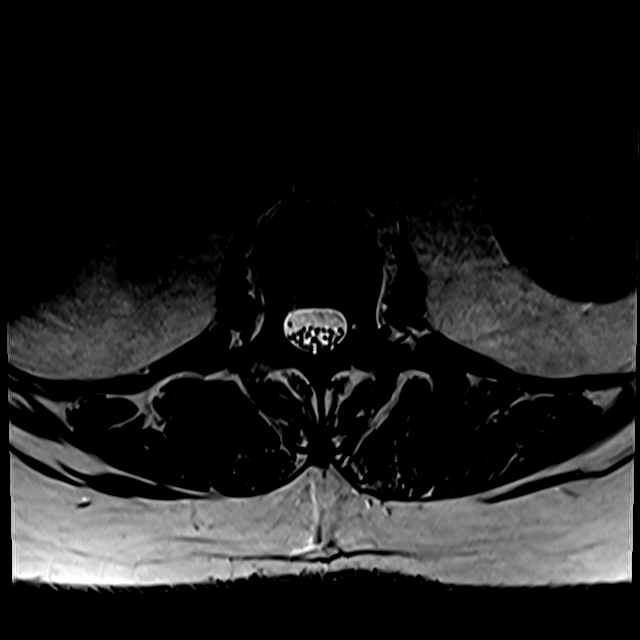

[Series 100: T1 · axial · 4.0mm · 0.28mm/px · z∈[-42,+108]mm · 3 of 39 slices shown (2 of 2)]
[im 6/39]
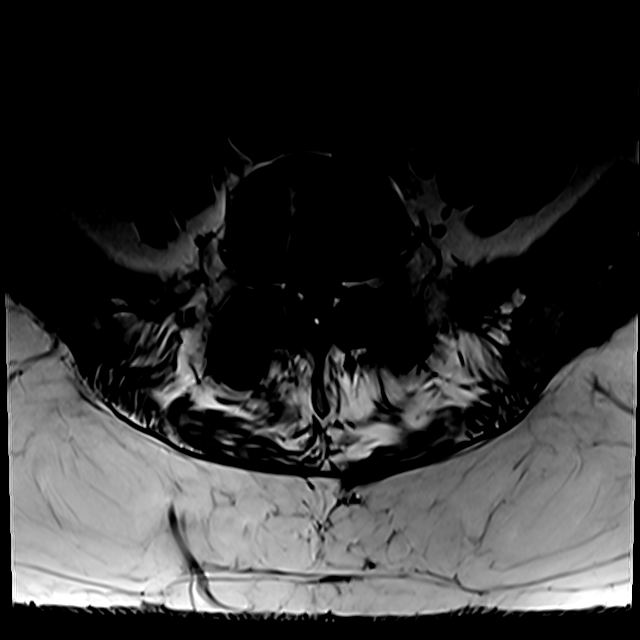
[im 20/39]
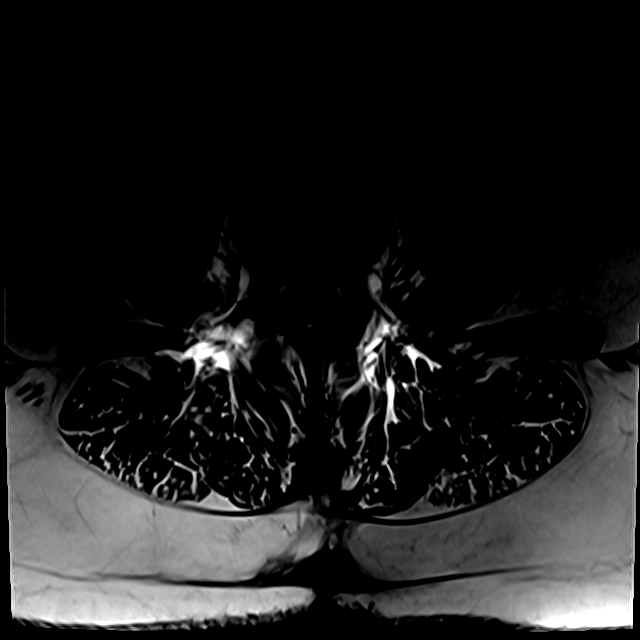
[im 33/39]
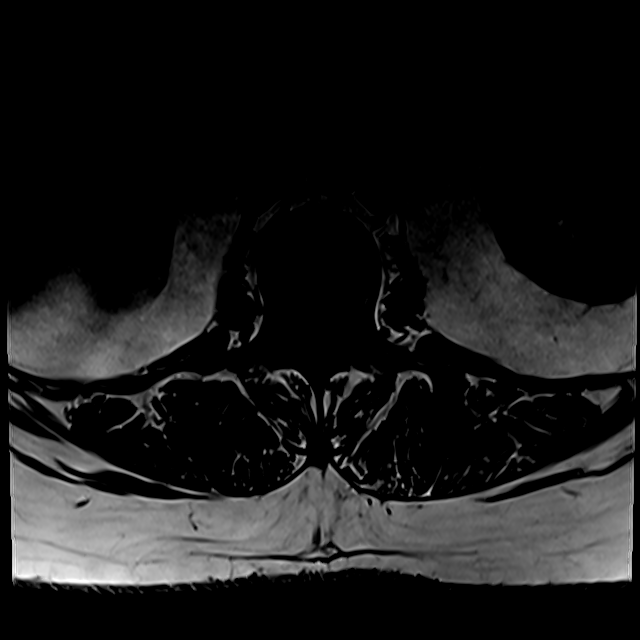

[18 of 48 positions shown; findings below may reference images not displayed]

FINDINGS: Segmentation: 5 non rib-bearing lumbar type vertebral bodies are
present. The lowest fully formed vertebral body is L5.

Alignment: Slight retrolisthesis is now present at L1-2 and L2-3.
Previously noted anterolisthesis at L4-5 is no longer present.

Vertebrae: Chronic fatty endplate marrow changes are associated with
Schmorl's nodes at L1-2 and L2-3. Marrow signal and vertebral body
heights are otherwise normal.

Conus medullaris and cauda equina: Conus extends to the L1 level.
Conus and cauda equina appear normal.

Paraspinal and other soft tissues: Limited imaging the abdomen is
unremarkable. There is no significant adenopathy. No solid organ
lesions are present.

Disc levels:

T12-L1: Negative.

L1-2: Some uncovering of the disc noted. Mild facet hypertrophy
present bilaterally. No significant disc protrusion scratched at no
significant stenosis is present.

L2-3: Uncovering of a broad-based disc protrusion has progressed.
Moderate facet hypertrophy is progressed bilaterally. Mild left
subarticular and bilateral foraminal narrowing is now present.

L3-4: A broad-based disc protrusion is present. Moderate facet
hypertrophy is noted. Mild subarticular narrowing is present
bilaterally. Moderate right mild left foraminal narrowing is new.

L4-5: Central canal and foramina are decompressed. Solid fusion is
noted. No residual or recurrent stenosis is present.

L5-S1: Progressive moderate facet hypertrophy is present
bilaterally, right greater than left. No significant disc protrusion
or stenosis is present.
IMPRESSION: 1. Solid fusion at L4-5 without residual or recurrent stenosis.
2. Progressive moderate facet hypertrophy at L5-S1 without
significant disc protrusion or stenosis.
3. Progressive adjacent level disease with mild subarticular
narrowing bilaterally at L3-4 and moderate right and mild left
foraminal narrowing.
4. Mild left subarticular and bilateral foraminal narrowing at L2-3
is now present.

## 2021-03-15 ENCOUNTER — Other Ambulatory Visit: Payer: Self-pay | Admitting: Gastroenterology

## 2021-04-19 ENCOUNTER — Other Ambulatory Visit: Payer: Self-pay | Admitting: Cardiology

## 2021-04-19 DIAGNOSIS — I10 Essential (primary) hypertension: Secondary | ICD-10-CM

## 2021-04-26 ENCOUNTER — Other Ambulatory Visit: Payer: Self-pay | Admitting: Emergency Medicine

## 2021-04-26 DIAGNOSIS — D329 Benign neoplasm of meninges, unspecified: Secondary | ICD-10-CM

## 2021-05-03 ENCOUNTER — Encounter: Payer: Self-pay | Admitting: Cardiology

## 2021-05-03 ENCOUNTER — Ambulatory Visit: Payer: Medicare Other | Admitting: Cardiology

## 2021-05-03 ENCOUNTER — Other Ambulatory Visit: Payer: Self-pay

## 2021-05-03 VITALS — BP 121/56 | HR 88 | Temp 97.9°F | Resp 17 | Ht 61.0 in | Wt 189.0 lb

## 2021-05-03 DIAGNOSIS — I10 Essential (primary) hypertension: Secondary | ICD-10-CM

## 2021-05-03 DIAGNOSIS — I2510 Atherosclerotic heart disease of native coronary artery without angina pectoris: Secondary | ICD-10-CM

## 2021-05-03 DIAGNOSIS — I700 Atherosclerosis of aorta: Secondary | ICD-10-CM

## 2021-05-03 DIAGNOSIS — I771 Stricture of artery: Secondary | ICD-10-CM

## 2021-05-03 DIAGNOSIS — I209 Angina pectoris, unspecified: Secondary | ICD-10-CM

## 2021-05-03 DIAGNOSIS — I252 Old myocardial infarction: Secondary | ICD-10-CM

## 2021-05-03 DIAGNOSIS — R0609 Other forms of dyspnea: Secondary | ICD-10-CM

## 2021-05-03 DIAGNOSIS — I6523 Occlusion and stenosis of bilateral carotid arteries: Secondary | ICD-10-CM

## 2021-05-03 MED ORDER — NITROGLYCERIN 0.4 MG SL SUBL: 0.4000 mg | SUBLINGUAL_TABLET | SUBLINGUAL | 0 refills | Status: DC | PRN

## 2021-05-03 MED ORDER — RANOLAZINE ER 500 MG PO TB12: 500.0000 mg | ORAL_TABLET | Freq: Two times a day (BID) | ORAL | 0 refills | Status: DC

## 2021-05-03 NOTE — Progress Notes (Signed)
ID:  Carmen Morgan, DOB 01/24/1956, MRN [id number]  PCP:  Etter Sjogren, FNP  Cardiologist:  Rex Kras, DO, [facility] (established care 10/14/2020)  Date: 05/03/21 Last Office Visit: 11/01/2020  Chief Complaint  Patient presents with   Dyspnea on exertion    6 MONTH   Chest Pain    Heaviness    HPI  Carmen Morgan is a 66 y.o. female who presents to the office with a chief complaint of " chest heaviness and shortness of breath" Patient's past medical history and cardiovascular risk factors include: Mild nonobstructive CAD, mild coronary artery calcification, history of COVID-19 infection (January 2020 and July 2022), hypertension, on hormone replacement therapy, former smoker, postmenopausal female, advanced age, obesity due to excess calories.  She established care back in July 2022 when she presented to the ED as a non-STEMI with intermittent left bundle branch block.  She was recommended to undergo ischemic work-up with either angiography or coronary CTA the patient chose the latter.  She was found to have mild coronary artery calcification with mild nonobstructive CAD.  She was safely discharged home, medications were uptitrated, and educated on importance of secondary prevention.  She now presents for 23-month follow-up visit accompanied by her husband.  Patient continues to have chest heaviness/tightness and dyspnea on exertion, mostly with effort related activities, better with resting and relaxing.  Patient states that the discomfort now that she is experiencing is worse compared to July 2022 when she presented to the ED.  She has new onset of jaw pain as well.  When asked why she has not seek medical attention regarding this patient had no true explanation.  One of the antianginal therapies also included metoprolol which she stopped due to difficulty breathing.  Patient states that she is concerned that "something was missed on CT back in July." She denies active CP and refuses  direct admit for a sooner evaluation.   FUNCTIONAL STATUS: No structured exercise program or daily routine.   ALLERGIES: Allergies  Allergen Reactions   Penicillins Hives    Did it involve swelling of the face/tongue/throat, SOB, or low BP? No Did it involve sudden or severe rash/hives, skin peeling, or any reaction on the inside of your mouth or nose? No Did you need to seek medical attention at a hospital or doctor's office? No When did it last happen?      childhood allergy  If all above answers are NO, may proceed with cephalosporin use.    Vitamin B12 Nausea And Vomiting   Vicks Nyquil Cough [Doxylamine-Dm] Palpitations    Irregular Heart Rate    MEDICATION LIST PRIOR TO VISIT: Current Meds  Medication Sig   acetaminophen (TYLENOL) 500 MG tablet Take 1,000 mg by mouth every 6 (six) hours as needed for moderate pain or headache.   ascorbic acid (VITAMIN C) 250 MG CHEW Chew 250 mg by mouth daily.   aspirin 81 MG EC tablet Take 1 tablet (81 mg total) by mouth daily with breakfast. Swallow whole.   atorvastatin (LIPITOR) 20 MG tablet Take 1 tablet (20 mg total) by mouth daily. (Patient not taking: Reported on 05/08/2021)   Cholecalciferol (VITAMIN D3) 50 MCG (2000 UT) TABS Take 2,000 Units by mouth daily.    estradiol (ESTRACE) 0.5 MG tablet Take 0.5 mg by mouth at bedtime.    fexofenadine (ALLEGRA) 180 MG tablet Take 180 mg by mouth daily.   fluorouracil (EFUDEX) 5 % cream Apply 1 application topically 2 (two) times daily. (Patient  not taking: Reported on 05/08/2021)   fluticasone (FLONASE) 50 MCG/ACT nasal spray Place 2 sprays into both nostrils daily.    hydrochlorothiazide (HYDRODIURIL) 25 MG tablet TAKE 1 TABLET BY MOUTH EVERY DAY IN THE MORNING   lisinopril (ZESTRIL) 20 MG tablet TAKE 1 TABLET (20 MG TOTAL) BY MOUTH DAILY AT 10 PM.   Melatonin 10 MG CAPS Take 10 mg by mouth at bedtime.   nitroGLYCERIN (NITROSTAT) 0.4 MG SL tablet Place 1 tablet (0.4 mg total) under the  tongue every 5 (five) minutes as needed for chest pain. If you require more than two tablets five minutes apart go to the nearest ER via EMS.   omeprazole (PRILOSEC) 40 MG capsule Take 40 mg by mouth 2 (two) times daily.   ranolazine (RANEXA) 500 MG 12 hr tablet Take 1 tablet (500 mg total) by mouth 2 (two) times daily.     PAST MEDICAL HISTORY: Past Medical History:  Diagnosis Date   Coronary atherosclerosis due to calcified coronary lesion    GERD (gastroesophageal reflux disease)    History of COVID-04 April 2018 and July 2022.   Hypertension    Nonobstructive atherosclerosis of coronary artery    Seasonal allergies     PAST SURGICAL HISTORY: Past Surgical History:  Procedure Laterality Date   ABDOMINAL HYSTERECTOMY  2014   BACK SURGERY  2012   COLONOSCOPY N/A 02/16/2019   Procedure: COLONOSCOPY;  Surgeon: Daneil Dolin, MD;  Location: [facility];  Service: Endoscopy;  Laterality: N/A;  2:00   ESOPHAGOGASTRODUODENOSCOPY N/A 01/18/2020   Procedure: ESOPHAGOGASTRODUODENOSCOPY (EGD);  Surgeon: Daneil Dolin, MD;  Location: [facility];  Service: Endoscopy;  Laterality: N/A;  2:45pm   left breast cystectomy  [place] N/A 01/18/2020   Procedure: MALONEY DILATION;  Surgeon: Daneil Dolin, MD;  Location: [facility];  Service: Endoscopy;  Laterality: N/A;   URETHRAL SLING  07/2020   VEIN SURGERY  2013    FAMILY HISTORY: The patient family history includes Arthritis in her mother; CAD in her mother and sister; COPD in her sister; Cancer in her father; Diabetes in her mother and sister; Heart attack in her mother; Hypertension in her father; Testicular cancer in her brother.  SOCIAL HISTORY:  The patient  reports that she quit smoking about 12 years ago. Her smoking use included cigarettes. She has a 20.00 pack-year smoking history. She has never used smokeless tobacco. She reports that she does not drink alcohol and does not use drugs.  REVIEW OF  SYSTEMS: Review of Systems  Cardiovascular:  Positive for chest pain and dyspnea on exertion. Negative for leg swelling, near-syncope, orthopnea, palpitations and syncope.  Respiratory:  Positive for shortness of breath.    PHYSICAL EXAM: Vitals with BMI 05/03/2021 11/02/2020 11/01/2020  Height 5\' 1"  5\' 1"  5\' 1"   Weight 189 lbs 184 lbs 187 lbs  BMI 35.73 88.50 27.74  Systolic 128 786 767  Diastolic 56 68 71  Pulse 88 84 73    CONSTITUTIONAL: Well-developed and well-nourished. No acute distress.  SKIN: Skin is warm and dry. No rash noted. No cyanosis. No pallor. No jaundice HEAD: Normocephalic and atraumatic.  EYES: No scleral icterus MOUTH/THROAT: Moist oral membranes.  NECK: No JVD present. No thyromegaly noted.  Bilateral carotid bruits  LYMPHATIC: No visible cervical adenopathy.  CHEST Normal respiratory effort. No intercostal retractions  LUNGS: Clear to auscultation bilaterally.  No stridor. No wheezes. No rales.  CARDIOVASCULAR: Regular, positive  S1-S2, no murmurs rubs or gallops appreciated. ABDOMINAL: Obese, soft, nontender, nondistended, positive bowel sounds in all 4 quadrants no apparent ascites.  EXTREMITIES: Left lower extremity edema 2+, right lower extremity edema 1+, warm to touch, difficult to appreciate DP and PT pulses secondary to swelling. HEMATOLOGIC: No significant bruising NEUROLOGIC: Oriented to person, place, and time. Nonfocal. Normal muscle tone.  PSYCHIATRIC: Normal mood and affect. Normal behavior. Cooperative  CARDIAC DATABASE: EKG: 05/03/2021: NSR, 85 bpm, left bundle branch block.  Echocardiogram: 10/14/2020: LVEF 65-70%, no regional wall motion abnormalities, mild LVH, trivial MR.  Stress test: None  Heart catheterization: None  Carotid Duplex 10/15/2020:  Right Carotid: Velocities in the right ICA are consistent with a 60-79% stenosis. The ECA appears >50% stenosed.  Left Carotid: Velocities in the left ICA are consistent with a 40-59%   stenosis. Non-hemodynamically significant plaque <50% noted in the CCA.  Vertebrals:  Right vertebral artery demonstrates antegrade flow. Left vertebral artery demonstrates retrograde flow.  Subclavians: Left subclavian artery was stenotic. Normal flow hemodynamics  were seen in the right subclavian artery.    Coronary CTA 10/15/2020: 1. Total coronary calcium score of 14.1. This was 60th percentile for age and sex matched control. 2. Normal coronary origin with right dominance. 3. CAD-RADS = 2. Minimal stenosis (0-24%) due to noncalcified plaque in the proximal LAD. Mild stenosis (25-49%) within the mid LCx due to mixed plaque. Minimal stenosis (0-24%) due to mixed plaque in proximal RCA. 4. Aortic atherosclerosis within descending aorta.  LABORATORY DATA: CBC Latest Ref Rng & Units 05/07/2021 10/16/2020 10/15/2020  WBC 3.4 - 10.8 x10E3/uL 7.0 7.5 7.2  Hemoglobin 11.1 - 15.9 g/dL 12.7 10.7(L) 11.0(L)  Hematocrit 34.0 - 46.6 % 38.6 33.9(L) 33.9(L)  Platelets 150 - 450 x10E3/uL 227 242 242    CMP Latest Ref Rng & Units 05/07/2021 11/08/2020 10/15/2020  Glucose 70 - 99 mg/dL 122(H) 107(H) 157(H)  BUN 8 - 27 mg/dL 15 17 15   Creatinine 0.57 - 1.00 mg/dL 0.70 0.69 0.67  Sodium 134 - 144 mmol/L 142 139 138  Potassium 3.5 - 5.2 mmol/L 5.0 4.8 3.6  Chloride 96 - 106 mmol/L 105 103 106  CO2 20 - 29 mmol/L 24 23 23   Calcium 8.7 - 10.3 mg/dL 10.4(H) 10.1 9.9  Total Protein 6.5 - 8.1 g/dL - - -  Total Bilirubin 0.3 - 1.2 mg/dL - - -  Alkaline Phos 38 - 126 U/L - - -  AST 15 - 41 U/L - - -  ALT 0 - 44 U/L - - -    Lipid Panel     Component Value Date/Time   CHOL 140 05/07/2021 0941   TRIG 103 05/07/2021 0941   HDL 52 05/07/2021 0941   CHOLHDL 3.2 10/15/2020 0225   VLDL 36 10/15/2020 0225   LDLCALC 69 05/07/2021 0941   LDLDIRECT 67 05/07/2021 0941   LABVLDL 19 05/07/2021 0941    No components found for: NTPROBNP No results for input(s): PROBNP in the last 8760 hours. No results for  input(s): TSH in the last 8760 hours.  BMP Recent Labs    10/13/20 2200 10/14/20 0300 10/15/20 0225 11/08/20 0944 05/07/21 0941  NA 136 137 138 139 142  K 3.5 3.8 3.6 4.8 5.0  CL 102 105 106 103 105  CO2 25 24 23 23 24   GLUCOSE 115* 123* 157* 107* 122*  BUN 23 17 15 17 15   CREATININE 0.75 0.62 0.67 0.69 0.70  CALCIUM 10.2 9.7 9.9 10.1 10.4*  GFRNONAA >60 >60 >60  --   --     HEMOGLOBIN A1C Lab Results  Component Value Date   HGBA1C 6.2 (H) 10/15/2020   MPG 131.24 10/15/2020    IMPRESSION:    ICD-10-CM   1. Angina pectoris (HCC)  I20.9 ranolazine (RANEXA) 500 MG 12 hr tablet    CBC    Basic metabolic panel    Magnesium    Lipid Panel With LDL/HDL Ratio    LDL cholesterol, direct    nitroGLYCERIN (NITROSTAT) 0.4 MG SL tablet    2. Nonobstructive atherosclerosis of coronary artery  I25.10 Lipid Panel With LDL/HDL Ratio    LDL cholesterol, direct    nitroGLYCERIN (NITROSTAT) 0.4 MG SL tablet    3. Hx of non-ST elevation myocardial infarction (NSTEMI)  I25.2 Lipid Panel With LDL/HDL Ratio    LDL cholesterol, direct    nitroGLYCERIN (NITROSTAT) 0.4 MG SL tablet    4. Benign hypertension  I10 EKG 12-Lead    nitroGLYCERIN (NITROSTAT) 0.4 MG SL tablet    5. Dyspnea on exertion  R06.09     6. Coronary atherosclerosis due to calcified coronary lesion  I25.10    I25.84     7. Aortic atherosclerosis (HCC)  I70.0     8. Bilateral carotid artery stenosis  I65.23     9. Stenosis of left subclavian artery (HCC)  I77.1        RECOMMENDATIONS: TONISHA SILVEY is a 66 y.o. female whose past medical history and cardiac risk factors include: Mild nonobstructive CAD, mild coronary artery calcification, history of COVID-19 infection (January 2020 and July 2022), hypertension, on hormone replacement therapy, former smoker, postmenopausal female, advanced age, obesity due to excess calories.  Angina pectoris ([place]) Acute exacerbation of chronic nonobstructive CAD Current  symptoms are concerning for angina pectoris No active chest pain Patient does not want to be admitted electively for an expedited evaluation.  Patient states that she will seek medical attention if her symptoms radiate increase in intensity, frequency, duration. Patient stopped metoprolol secondary to dyspnea. Start Ranexa 500 mg p.o. twice daily Sublingual nitroglycerin tablets to use on a as needed basis.  Medication profile discussed. The procedure of left heart catheterization with possible intervention was explained to the patient in detail.  The indication, alternatives, risks and benefits were reviewed.  Complications include but not limited to bleeding, infection, vascular injury, stroke, myocardial infection, arrhythmia, kidney injury requiring short-term or long-term hemodialysis, radiation-related injury in the case of prolonged fluoroscopy use, emergency cardiac surgery, and death. The patient understands the risks of serious complication is 1-2 in 1610 with diagnostic cardiac cath and 1-2% or less with angioplasty/stenting.  The patient voices understanding and provides verbal feedback and wishes to proceed with coronary angiography with possible PCI.  Nonobstructive atherosclerosis of coronary artery / Coronary atherosclerosis due to calcified coronary lesion/  Hx of non-ST elevation myocardial infarction (NSTEMI) See above. Continue aspirin and statin therapy. Further recommendations to follow.  Benign hypertension Blood pressures are within acceptable range. Medications reconciled. Reemphasized importance of low-salt diet. No medication changes warranted during today's encounter.  Dyspnea on exertion Multifactorial. Ischemic work-up as outlined above. However, if patient does not have obstructive CAD would recommend pulmonary evaluation which I will defer to PCP. Further recommendations to follow.  Bilateral carotid artery stenosis  / Stenosis of left subclavian artery  (HCC) Currently on aspirin and statin therapy. Follows with vascular surgery   Dyspnea on exertion Clinically not in congestive heart failure. Discontinue metoprolol Will  restart lisinopril 20 mg p.o. every afternoon. Will restart HCTZ at 25 mg p.o. every morning Blood work in 1 week to evaluate kidney function and electrolytes Recently underwent an echocardiogram and coronary CTA results reviewed and noted above for further reference.  Nonobstructive atherosclerosis of coronary artery & Coronary atherosclerosis due to calcified coronary lesion without angina pectoris: Continue aspirin and statin therapy. Secondary prevention discussed. Recently has undergone an ischemic evaluation as discussed above.  Benign hypertension Office blood pressures are currently not at goal. Medication changes as discussed above. Continue to monitor.  Bilateral carotid artery stenosis and left subclavian artery stenosis: Currently on aspirin and statin therapy. Left subclavian stenosis may need to be accounted for if and when LIMA graft is considered for coronary reconstruction/revascularization. Follows up with vascular surgery.  During today's encounter discussed exacerbation of nonobstructive CAD which she presents with angina pectoris, independently ordered/reviewed EKG, reviewed prior diagnostic work-up including echo and coronary CTA results as part of medical decision making, prescription drugs provided as discussed above, discussed medication profile, and obtaining informed consent with regards to the upcoming left heart catheterization.  The plan of care and symptoms were also discussed with patient's husband who was present during today's encounter.  FINAL MEDICATION LIST END OF ENCOUNTER: Meds ordered this encounter  Medications   ranolazine (RANEXA) 500 MG 12 hr tablet    Sig: Take 1 tablet (500 mg total) by mouth 2 (two) times daily.    Dispense:  60 tablet    Refill:  0   nitroGLYCERIN  (NITROSTAT) 0.4 MG SL tablet    Sig: Place 1 tablet (0.4 mg total) under the tongue every 5 (five) minutes as needed for chest pain. If you require more than two tablets five minutes apart go to the nearest ER via EMS.    Dispense:  30 tablet    Refill:  0    Medications Discontinued During This Encounter  Medication Reason   cephALEXin (KEFLEX) 500 MG capsule    clindamycin (CLINDAGEL) 1 % gel    doxycycline (VIBRAMYCIN) 100 MG capsule    lisinopril-hydrochlorothiazide (ZESTORETIC) 20-25 MG tablet    metoprolol tartrate (LOPRESSOR) 50 MG tablet    pantoprazole (PROTONIX) 40 MG tablet    predniSONE (STERAPRED UNI-PAK 21 TAB) 10 MG (21) TBPK tablet      Current Outpatient Medications:    acetaminophen (TYLENOL) 500 MG tablet, Take 1,000 mg by mouth every 6 (six) hours as needed for moderate pain or headache., Disp: , Rfl:    ascorbic acid (VITAMIN C) 250 MG CHEW, Chew 250 mg by mouth daily., Disp: , Rfl:    aspirin 81 MG EC tablet, Take 1 tablet (81 mg total) by mouth daily with breakfast. Swallow whole., Disp: 30 tablet, Rfl: 11   atorvastatin (LIPITOR) 20 MG tablet, Take 1 tablet (20 mg total) by mouth daily. (Patient not taking: Reported on 05/08/2021), Disp: 30 tablet, Rfl: 3   Cholecalciferol (VITAMIN D3) 50 MCG (2000 UT) TABS, Take 2,000 Units by mouth daily. , Disp: , Rfl:    estradiol (ESTRACE) 0.5 MG tablet, Take 0.5 mg by mouth at bedtime. , Disp: , Rfl:    fexofenadine (ALLEGRA) 180 MG tablet, Take 180 mg by mouth daily., Disp: , Rfl:    fluorouracil (EFUDEX) 5 % cream, Apply 1 application topically 2 (two) times daily. (Patient not taking: Reported on 05/08/2021), Disp: , Rfl:    fluticasone (FLONASE) 50 MCG/ACT nasal spray, Place 2 sprays into both nostrils daily. , Disp: ,  Rfl:    hydrochlorothiazide (HYDRODIURIL) 25 MG tablet, TAKE 1 TABLET BY MOUTH EVERY DAY IN THE MORNING, Disp: 90 tablet, Rfl: 0   lisinopril (ZESTRIL) 20 MG tablet, TAKE 1 TABLET (20 MG TOTAL) BY MOUTH DAILY  AT 10 PM., Disp: 90 tablet, Rfl: 0   Melatonin 10 MG CAPS, Take 10 mg by mouth at bedtime., Disp: , Rfl:    nitroGLYCERIN (NITROSTAT) 0.4 MG SL tablet, Place 1 tablet (0.4 mg total) under the tongue every 5 (five) minutes as needed for chest pain. If you require more than two tablets five minutes apart go to the nearest ER via EMS., Disp: 30 tablet, Rfl: 0   omeprazole (PRILOSEC) 40 MG capsule, Take 40 mg by mouth 2 (two) times daily., Disp: , Rfl:    ranolazine (RANEXA) 500 MG 12 hr tablet, Take 1 tablet (500 mg total) by mouth 2 (two) times daily., Disp: 60 tablet, Rfl: 0   atorvastatin (LIPITOR) 20 MG tablet, Take 1 tablet (20 mg total) by mouth daily. (Patient taking differently: Take 20 mg by mouth at bedtime.), Disp: 30 tablet, Rfl: 11   Probiotic Product (PROBIOTIC DAILY PO), Take 1 tablet by mouth daily., Disp: , Rfl:   Orders Placed This Encounter  Procedures   CBC   Basic metabolic panel   Magnesium   Lipid Panel With LDL/HDL Ratio   LDL cholesterol, direct   EKG 12-Lead    There are no Patient Instructions on file for this visit.   --Continue cardiac medications as reconciled in final medication list. --Return in about 2 weeks (around 05/17/2021) for Follow up, Chest pain, Post heart catheterization. Or sooner if needed. --Continue follow-up with your primary care physician regarding the management of your other chronic comorbid conditions.  Patient's questions and concerns were addressed to her satisfaction. She voices understanding of the instructions provided during this encounter.   This note was created using a voice recognition software as a result there may be grammatical errors inadvertently enclosed that do not reflect the nature of this encounter. Every attempt is made to correct such errors.  Rex Kras, [place], [facility]  Pager: [phone] Office: [phone]

## 2021-05-08 LAB — CBC
Hematocrit: 38.6 % (ref 34.0–46.6)
Hemoglobin: 12.7 g/dL (ref 11.1–15.9)
MCH: 27.4 pg (ref 26.6–33.0)
MCHC: 32.9 g/dL (ref 31.5–35.7)
MCV: 83 fL (ref 79–97)
Platelets: 227 10*3/uL (ref 150–450)
RBC: 4.64 x10E6/uL (ref 3.77–5.28)
RDW: 14.3 % (ref 11.7–15.4)
WBC: 7 10*3/uL (ref 3.4–10.8)

## 2021-05-08 LAB — LIPID PANEL WITH LDL/HDL RATIO
Cholesterol, Total: 140 mg/dL (ref 100–199)
HDL: 52 mg/dL (ref >39)
LDL Chol Calc (NIH): 69 mg/dL (ref 0–99)
LDL/HDL Ratio: 1.3 ratio (ref 0.0–3.2)
Triglycerides: 103 mg/dL (ref 0–149)
VLDL Cholesterol Cal: 19 mg/dL (ref 5–40)

## 2021-05-08 LAB — BASIC METABOLIC PANEL
BUN/Creatinine Ratio: 21 (ref 12–28)
BUN: 15 mg/dL (ref 8–27)
CO2: 24 mmol/L (ref 20–29)
Calcium: 10.4 mg/dL — ABNORMAL HIGH (ref 8.7–10.3)
Chloride: 105 mmol/L (ref 96–106)
Creatinine, Ser: 0.7 mg/dL (ref 0.57–1.00)
Glucose: 122 mg/dL — ABNORMAL HIGH (ref 70–99)
Potassium: 5 mmol/L (ref 3.5–5.2)
Sodium: 142 mmol/L (ref 134–144)
eGFR: 95 mL/min/{1.73_m2} (ref >59)

## 2021-05-08 LAB — LDL CHOLESTEROL, DIRECT: LDL Direct: 67 mg/dL (ref 0–99)

## 2021-05-08 LAB — MAGNESIUM: Magnesium: 1.9 mg/dL (ref 1.6–2.3)

## 2021-05-14 ENCOUNTER — Encounter (HOSPITAL_COMMUNITY)
Admission: RE | Disposition: A | Discharge status: Home / Self Care | Payer: Self-pay | Source: Home / Self Care | Attending: Cardiology

## 2021-05-14 ENCOUNTER — Ambulatory Visit (HOSPITAL_COMMUNITY)
Admission: RE | Admit: 2021-05-14 | Discharge: 2021-05-14 | Disposition: A | Discharge status: Home / Self Care | Payer: Medicare Other | Attending: Cardiology | Admitting: Cardiology

## 2021-05-14 ENCOUNTER — Other Ambulatory Visit: Payer: Self-pay

## 2021-05-14 DIAGNOSIS — R0609 Other forms of dyspnea: Secondary | ICD-10-CM | POA: Diagnosis not present

## 2021-05-14 DIAGNOSIS — I209 Angina pectoris, unspecified: Secondary | ICD-10-CM

## 2021-05-14 DIAGNOSIS — Z79899 Other long term (current) drug therapy: Secondary | ICD-10-CM | POA: Diagnosis not present

## 2021-05-14 DIAGNOSIS — I10 Essential (primary) hypertension: Secondary | ICD-10-CM | POA: Diagnosis not present

## 2021-05-14 DIAGNOSIS — Z8616 Personal history of COVID-19: Secondary | ICD-10-CM | POA: Insufficient documentation

## 2021-05-14 DIAGNOSIS — I771 Stricture of artery: Secondary | ICD-10-CM | POA: Diagnosis not present

## 2021-05-14 DIAGNOSIS — Z7982 Long term (current) use of aspirin: Secondary | ICD-10-CM | POA: Insufficient documentation

## 2021-05-14 DIAGNOSIS — I2584 Coronary atherosclerosis due to calcified coronary lesion: Secondary | ICD-10-CM | POA: Insufficient documentation

## 2021-05-14 DIAGNOSIS — I252 Old myocardial infarction: Secondary | ICD-10-CM | POA: Diagnosis not present

## 2021-05-14 DIAGNOSIS — I25119 Atherosclerotic heart disease of native coronary artery with unspecified angina pectoris: Secondary | ICD-10-CM | POA: Diagnosis not present

## 2021-05-14 DIAGNOSIS — I6523 Occlusion and stenosis of bilateral carotid arteries: Secondary | ICD-10-CM | POA: Insufficient documentation

## 2021-05-14 DIAGNOSIS — I700 Atherosclerosis of aorta: Secondary | ICD-10-CM | POA: Diagnosis not present

## 2021-05-14 HISTORY — PX: LEFT HEART CATH AND CORONARY ANGIOGRAPHY: CATH118249

## 2021-05-14 SURGERY — LEFT HEART CATH AND CORONARY ANGIOGRAPHY
Anesthesia: LOCAL

## 2021-05-14 MED ORDER — NITROGLYCERIN 1 MG/10 ML FOR IR/CATH LAB
INTRA_ARTERIAL | Status: DC | PRN
  Administered 2021-05-14: 200 ug via INTRA_ARTERIAL

## 2021-05-14 MED ORDER — FUROSEMIDE 10 MG/ML IJ SOLN
INTRAMUSCULAR | Status: DC | PRN
  Administered 2021-05-14: 20 mg via INTRAVENOUS

## 2021-05-14 MED ORDER — MIDAZOLAM HCL 2 MG/2ML IJ SOLN
INTRAMUSCULAR | Status: DC | PRN
Start: 2021-05-14 — End: 2021-05-14
  Administered 2021-05-14: 1 mg via INTRAVENOUS
  Administered 2021-05-14: 2 mg via INTRAVENOUS

## 2021-05-14 MED ORDER — IOHEXOL 350 MG/ML SOLN
INTRAVENOUS | Status: DC | PRN
Start: 2021-05-14 — End: 2021-05-14
  Administered 2021-05-14: 30 mL via INTRA_ARTERIAL

## 2021-05-14 MED ORDER — LIDOCAINE HCL (PF) 1 % IJ SOLN
INTRAMUSCULAR | Status: AC
  Filled 2021-05-14: qty 30

## 2021-05-14 MED ORDER — SODIUM CHLORIDE 0.9% FLUSH: 3.0000 mL | INTRAVENOUS | Status: DC | PRN

## 2021-05-14 MED ORDER — FUROSEMIDE 10 MG/ML IJ SOLN
INTRAMUSCULAR | Status: AC
  Filled 2021-05-14: qty 4

## 2021-05-14 MED ORDER — SODIUM CHLORIDE 0.9 % IV SOLN: 250.0000 mL | INTRAVENOUS | Status: DC | PRN

## 2021-05-14 MED ORDER — ASPIRIN 81 MG PO CHEW: 81.0000 mg | CHEWABLE_TABLET | ORAL | Status: DC

## 2021-05-14 MED ORDER — HYDRALAZINE HCL 20 MG/ML IJ SOLN
INTRAMUSCULAR | Status: DC | PRN
  Administered 2021-05-14: 10 mg via INTRAVENOUS

## 2021-05-14 MED ORDER — HEPARIN SODIUM (PORCINE) 1000 UNIT/ML IJ SOLN
INTRAMUSCULAR | Status: AC
  Filled 2021-05-14: qty 10

## 2021-05-14 MED ORDER — MIDAZOLAM HCL 2 MG/2ML IJ SOLN
INTRAMUSCULAR | Status: AC
  Filled 2021-05-14: qty 2

## 2021-05-14 MED ORDER — FENTANYL CITRATE (PF) 100 MCG/2ML IJ SOLN
INTRAMUSCULAR | Status: AC
  Filled 2021-05-14: qty 2

## 2021-05-14 MED ORDER — ISOSORBIDE MONONITRATE ER 30 MG PO TB24: 30.0000 mg | ORAL_TABLET | Freq: Every day | ORAL | 2 refills | Status: DC

## 2021-05-14 MED ORDER — HEPARIN SODIUM (PORCINE) 1000 UNIT/ML IJ SOLN
INTRAMUSCULAR | Status: DC | PRN
  Administered 2021-05-14 (×2): 2000 [IU] via INTRAVENOUS

## 2021-05-14 MED ORDER — SODIUM CHLORIDE 0.9 % WEIGHT BASED INFUSION
3.0000 mL/kg/h | INTRAVENOUS | Status: AC
  Administered 2021-05-14: 3 mL/kg/h via INTRAVENOUS

## 2021-05-14 MED ORDER — VERAPAMIL HCL 2.5 MG/ML IV SOLN
INTRAVENOUS | Status: AC
  Filled 2021-05-14: qty 2

## 2021-05-14 MED ORDER — HEPARIN (PORCINE) IN NACL 2-0.9 UNITS/ML
INTRAMUSCULAR | Status: DC | PRN
  Administered 2021-05-14: 10 mL via INTRA_ARTERIAL

## 2021-05-14 MED ORDER — SODIUM CHLORIDE 0.9% FLUSH: 3.0000 mL | Freq: Two times a day (BID) | INTRAVENOUS | Status: DC

## 2021-05-14 MED ORDER — HEPARIN (PORCINE) IN NACL 1000-0.9 UT/500ML-% IV SOLN
INTRAVENOUS | Status: DC | PRN
  Administered 2021-05-14 (×2): 500 mL

## 2021-05-14 MED ORDER — SODIUM CHLORIDE 0.9 % WEIGHT BASED INFUSION: 1.0000 mL/kg/h | INTRAVENOUS | Status: DC

## 2021-05-14 MED ORDER — FENTANYL CITRATE (PF) 100 MCG/2ML IJ SOLN
INTRAMUSCULAR | Status: DC | PRN
Start: 2021-05-14 — End: 2021-05-14
  Administered 2021-05-14: 50 ug via INTRAVENOUS
  Administered 2021-05-14: 25 ug via INTRAVENOUS

## 2021-05-14 MED ORDER — HYDRALAZINE HCL 20 MG/ML IJ SOLN
INTRAMUSCULAR | Status: AC
  Filled 2021-05-14: qty 1

## 2021-05-14 MED ORDER — LIDOCAINE HCL (PF) 1 % IJ SOLN
INTRAMUSCULAR | Status: DC | PRN
  Administered 2021-05-14: 2 mL

## 2021-05-14 MED ORDER — NITROGLYCERIN 1 MG/10 ML FOR IR/CATH LAB
INTRA_ARTERIAL | Status: AC
  Filled 2021-05-14: qty 10

## 2021-05-14 MED ORDER — HEPARIN (PORCINE) IN NACL 1000-0.9 UT/500ML-% IV SOLN
INTRAVENOUS | Status: AC
  Filled 2021-05-14: qty 1000

## 2021-05-14 SURGICAL SUPPLY — 11 items
CATH OPTITORQUE TIG 4.0 5F (CATHETERS) ×1 IMPLANT
DEVICE RAD COMP TR BAND LRG (VASCULAR PRODUCTS) ×1 IMPLANT
GLIDESHEATH SLEND A-KIT 6F 22G (SHEATH) ×1 IMPLANT
GUIDEWIRE INQWIRE 1.5J.035X260 (WIRE) IMPLANT
INQWIRE 1.5J .035X260CM (WIRE) ×2
KIT HEART LEFT (KITS) ×3 IMPLANT
PACK CARDIAC CATHETERIZATION (CUSTOM PROCEDURE TRAY) ×3 IMPLANT
SYR MEDRAD MARK 7 150ML (SYRINGE) ×3 IMPLANT
TRANSDUCER W/STOPCOCK (MISCELLANEOUS) ×3 IMPLANT
TUBING CIL FLEX 10 FLL-RA (TUBING) ×3 IMPLANT
WIRE HI TORQ VERSACORE-J 145CM (WIRE) ×1 IMPLANT

## 2021-05-14 NOTE — Progress Notes (Signed)
On arrival from cath lab Dr Virgina Jock in with Vicente Males RN to check right radial site and site ok per Dr Virgina Jock

## 2021-05-14 NOTE — Interval H&P Note (Signed)
History and Physical Interval Note:  05/14/2021 2:52 PM  Carmen Morgan  has presented today for surgery, with the diagnosis of DOE.  The various methods of treatment have been discussed with the patient and family. After consideration of risks, benefits and other options for treatment, the patient has consented to  Procedure(s): LEFT HEART CATH AND CORONARY ANGIOGRAPHY (N/A) as a surgical intervention.  The patient's history has been reviewed, patient examined, no change in status, stable for surgery.  I have reviewed the patient's chart and labs.  Questions were answered to the patient's satisfaction.    2016/2017 Appropriate Use Criteria for Coronary Revascularization Symptom Status: Ischemic Symptoms  Non-invasive Testing: Low risk  If no or indeterminate stress test, FFR/iFR results in all diseased vessels: N/A  Diabetes Mellitus: No  S/P CABG: No  Antianginal therapy (number of long-acting drugs): 1  Patient undergoing renal transplant: No  Patient undergoing percutaneous valve procedure: No  1 Vessel Disease PCI CABG  No proximal LAD involvement, No proximal left dominant LCX involvement M (4); Indication 1 R (3); Indication 1  Proximal left dominant LCX involvement M (5); Indication 4 M (5); Indication 4  Proximal LAD involvement M (5); Indication 4 M (5); Indication 4  2 Vessel Disease  No proximal LAD involvement M (5); Indication 7 M (4); Indication 7  Proximal LAD involvement M (6); Indication 10 M (6); Indication 10  3 Vessel Disease  Low disease complexity (e.g., focal stenoses, SYNTAX <=22) M (6); Indication 16 M (6); Indication 16  Intermediate or high disease complexity (e.g., SYNTAX >=23) M (5); Indication 20 A (7); Indication 20  Left Main Disease  Isolated LMCA disease: ostial or midshaft A (7); Indication 24 A (9); Indication 24  Isolated LMCA disease: bifurcation involvement M (5); Indication 25 A (9); Indication 25  LMCA ostial or midshaft, concurrent low disease  burden multivessel disease (e.g., 1-2 additional focal stenoses, SYNTAX <=22) A (7); Indication 26 A (9); Indication 26  LMCA ostial or midshaft, concurrent intermediate or high disease burden multivessel disease (e.g., 1-2 additional bifurcation stenoses, long stenoses, SYNTAX >=23) M (4); Indication 27 A (9); Indication 27  LMCA bifurcation involvement, concurrent low disease burden multivessel disease (e.g., 1-2 additional focal stenoses, SYNTAX <=22) M (5); Indication 28 A (9); Indication 28  LMCA bifurcation involvement, concurrent intermediate or high disease burden multivessel disease (e.g., 1-2 additional bifurcation stenoses, long stenoses, SYNTAX >=23) R (3); Indication 29 A (9); Indication [facility]

## 2021-05-14 NOTE — H&P (Signed)
OV 05/03/2021 copied for documentation    ID:  Carmen Morgan, DOB 1956/01/20, MRN [id number]  PCP:  Etter Sjogren, FNP  Cardiologist:  Rex Kras, DO, [place] (established care 10/14/2020)  Date: 05/03/21 Last Office Visit: 11/01/2020  Chief Complaint  Patient presents with   Dyspnea on exertion    6 MONTH   Chest Pain    Heaviness    HPI  Carmen Morgan is a 66 y.o. female who presents to the office with a chief complaint of " chest heaviness and shortness of breath" Patient's past medical history and cardiovascular risk factors include: Mild nonobstructive CAD, mild coronary artery calcification, history of COVID-19 infection (January 2020 and July 2022), hypertension, on hormone replacement therapy, former smoker, postmenopausal female, advanced age, obesity due to excess calories.  She established care back in July 2022 when she presented to the ED as a non-STEMI with intermittent left bundle branch block.  She was recommended to undergo ischemic work-up with either angiography or coronary CTA the patient chose the latter.  She was found to have mild coronary artery calcification with mild nonobstructive CAD.  She was safely discharged home, medications were uptitrated, and educated on importance of secondary prevention.  She now presents for 35-month follow-up visit accompanied by her husband.  Patient continues to have chest heaviness/tightness and dyspnea on exertion, mostly with effort related activities, better with resting and relaxing.  Patient states that the discomfort now that she is experiencing is worse compared to July 2022 when she presented to the ED.  She has new onset of jaw pain as well.  When asked why she has not seek medical attention regarding this patient had no true explanation.  One of the antianginal therapies also included metoprolol which she stopped due to difficulty breathing.  Patient states that she is concerned that "something was missed on CT back in  July." She denies active CP and refuses direct admit for a sooner evaluation.   FUNCTIONAL STATUS: No structured exercise program or daily routine.   ALLERGIES: Allergies  Allergen Reactions   Penicillins Hives    Did it involve swelling of the face/tongue/throat, SOB, or low BP? No Did it involve sudden or severe rash/hives, skin peeling, or any reaction on the inside of your mouth or nose? No Did you need to seek medical attention at a hospital or doctor's office? No When did it last happen?      childhood allergy  If all above answers are NO, may proceed with cephalosporin use.    Vitamin B12 Nausea And Vomiting   Vicks Nyquil Cough [Doxylamine-Dm] Palpitations    Irregular Heart Rate    MEDICATION LIST PRIOR TO VISIT: Current Meds  Medication Sig   acetaminophen (TYLENOL) 500 MG tablet Take 1,000 mg by mouth every 6 (six) hours as needed for moderate pain or headache.   ascorbic acid (VITAMIN C) 250 MG CHEW Chew 250 mg by mouth daily.   aspirin 81 MG EC tablet Take 1 tablet (81 mg total) by mouth daily with breakfast. Swallow whole.   atorvastatin (LIPITOR) 20 MG tablet Take 1 tablet (20 mg total) by mouth daily. (Patient not taking: Reported on 05/08/2021)   Cholecalciferol (VITAMIN D3) 50 MCG (2000 UT) TABS Take 2,000 Units by mouth daily.    estradiol (ESTRACE) 0.5 MG tablet Take 0.5 mg by mouth at bedtime.    fexofenadine (ALLEGRA) 180 MG tablet Take 180 mg by mouth daily.   fluorouracil (EFUDEX) 5 % cream Apply 1  application topically 2 (two) times daily. (Patient not taking: Reported on 05/08/2021)   fluticasone (FLONASE) 50 MCG/ACT nasal spray Place 2 sprays into both nostrils daily.    hydrochlorothiazide (HYDRODIURIL) 25 MG tablet TAKE 1 TABLET BY MOUTH EVERY DAY IN THE MORNING   lisinopril (ZESTRIL) 20 MG tablet TAKE 1 TABLET (20 MG TOTAL) BY MOUTH DAILY AT 10 PM.   Melatonin 10 MG CAPS Take 10 mg by mouth at bedtime.   nitroGLYCERIN (NITROSTAT) 0.4 MG SL tablet Place  1 tablet (0.4 mg total) under the tongue every 5 (five) minutes as needed for chest pain. If you require more than two tablets five minutes apart go to the nearest ER via EMS.   omeprazole (PRILOSEC) 40 MG capsule Take 40 mg by mouth 2 (two) times daily.   ranolazine (RANEXA) 500 MG 12 hr tablet Take 1 tablet (500 mg total) by mouth 2 (two) times daily.     PAST MEDICAL HISTORY: Past Medical History:  Diagnosis Date   Coronary atherosclerosis due to calcified coronary lesion    GERD (gastroesophageal reflux disease)    History of COVID-04 April 2018 and July 2022.   Hypertension    Nonobstructive atherosclerosis of coronary artery    Seasonal allergies     PAST SURGICAL HISTORY: Past Surgical History:  Procedure Laterality Date   ABDOMINAL HYSTERECTOMY  2014   BACK SURGERY  2012   COLONOSCOPY N/A 02/16/2019   Procedure: COLONOSCOPY;  Surgeon: Daneil Dolin, MD;  Location: [facility];  Service: Endoscopy;  Laterality: N/A;  2:00   ESOPHAGOGASTRODUODENOSCOPY N/A 01/18/2020   Procedure: ESOPHAGOGASTRODUODENOSCOPY (EGD);  Surgeon: Daneil Dolin, MD;  Location: [facility];  Service: Endoscopy;  Laterality: N/A;  2:45pm   left breast cystectomy  [facility] N/A 01/18/2020   Procedure: MALONEY DILATION;  Surgeon: Daneil Dolin, MD;  Location: [facility];  Service: Endoscopy;  Laterality: N/A;   URETHRAL SLING  07/2020   VEIN SURGERY  2013    FAMILY HISTORY: The patient family history includes Arthritis in her mother; CAD in her mother and sister; COPD in her sister; Cancer in her father; Diabetes in her mother and sister; Heart attack in her mother; Hypertension in her father; Testicular cancer in her brother.  SOCIAL HISTORY:  The patient  reports that she quit smoking about 12 years ago. Her smoking use included cigarettes. She has a 20.00 pack-year smoking history. She has never used smokeless tobacco. She reports that she does not drink alcohol and  does not use drugs.  REVIEW OF SYSTEMS: Review of Systems  Cardiovascular:  Positive for chest pain and dyspnea on exertion. Negative for leg swelling, near-syncope, orthopnea, palpitations and syncope.  Respiratory:  Positive for shortness of breath.    PHYSICAL EXAM: Vitals with BMI 05/03/2021 11/02/2020 11/01/2020  Height 5\' 1"  5\' 1"  5\' 1"   Weight 189 lbs 184 lbs 187 lbs  BMI 35.73 97.35 32.99  Systolic 242 683 419  Diastolic 56 68 71  Pulse 88 84 73    CONSTITUTIONAL: Well-developed and well-nourished. No acute distress.  SKIN: Skin is warm and dry. No rash noted. No cyanosis. No pallor. No jaundice HEAD: Normocephalic and atraumatic.  EYES: No scleral icterus MOUTH/THROAT: Moist oral membranes.  NECK: No JVD present. No thyromegaly noted.  Bilateral carotid bruits  LYMPHATIC: No visible cervical adenopathy.  CHEST Normal respiratory effort. No intercostal retractions  LUNGS: Clear to auscultation bilaterally.  No stridor. No  wheezes. No rales.  CARDIOVASCULAR: Regular, positive S1-S2, no murmurs rubs or gallops appreciated. ABDOMINAL: Obese, soft, nontender, nondistended, positive bowel sounds in all 4 quadrants no apparent ascites.  EXTREMITIES: Left lower extremity edema 2+, right lower extremity edema 1+, warm to touch, difficult to appreciate DP and PT pulses secondary to swelling. HEMATOLOGIC: No significant bruising NEUROLOGIC: Oriented to person, place, and time. Nonfocal. Normal muscle tone.  PSYCHIATRIC: Normal mood and affect. Normal behavior. Cooperative  CARDIAC DATABASE: EKG: 05/03/2021: NSR, 85 bpm, left bundle branch block.  Echocardiogram: 10/14/2020: LVEF 65-70%, no regional wall motion abnormalities, mild LVH, trivial MR.  Stress test: None  Heart catheterization: None  Carotid Duplex 10/15/2020:  Right Carotid: Velocities in the right ICA are consistent with a 60-79% stenosis. The ECA appears >50% stenosed.  Left Carotid: Velocities in the left ICA are  consistent with a 40-59%  stenosis. Non-hemodynamically significant plaque <50% noted in the CCA.  Vertebrals:  Right vertebral artery demonstrates antegrade flow. Left vertebral artery demonstrates retrograde flow.  Subclavians: Left subclavian artery was stenotic. Normal flow hemodynamics  were seen in the right subclavian artery.    Coronary CTA 10/15/2020: 1. Total coronary calcium score of 14.1. This was 60th percentile for age and sex matched control. 2. Normal coronary origin with right dominance. 3. CAD-RADS = 2. Minimal stenosis (0-24%) due to noncalcified plaque in the proximal LAD. Mild stenosis (25-49%) within the mid LCx due to mixed plaque. Minimal stenosis (0-24%) due to mixed plaque in proximal RCA. 4. Aortic atherosclerosis within descending aorta.  LABORATORY DATA: CBC Latest Ref Rng & Units 05/07/2021 10/16/2020 10/15/2020  WBC 3.4 - 10.8 x10E3/uL 7.0 7.5 7.2  Hemoglobin 11.1 - 15.9 g/dL 12.7 10.7(L) 11.0(L)  Hematocrit 34.0 - 46.6 % 38.6 33.9(L) 33.9(L)  Platelets 150 - 450 x10E3/uL 227 242 242    CMP Latest Ref Rng & Units 05/07/2021 11/08/2020 10/15/2020  Glucose 70 - 99 mg/dL 122(H) 107(H) 157(H)  BUN 8 - 27 mg/dL 15 17 15   Creatinine 0.57 - 1.00 mg/dL 0.70 0.69 0.67  Sodium 134 - 144 mmol/L 142 139 138  Potassium 3.5 - 5.2 mmol/L 5.0 4.8 3.6  Chloride 96 - 106 mmol/L 105 103 106  CO2 20 - 29 mmol/L 24 23 23   Calcium 8.7 - 10.3 mg/dL 10.4(H) 10.1 9.9  Total Protein 6.5 - 8.1 g/dL - - -  Total Bilirubin 0.3 - 1.2 mg/dL - - -  Alkaline Phos 38 - 126 U/L - - -  AST 15 - 41 U/L - - -  ALT 0 - 44 U/L - - -    Lipid Panel     Component Value Date/Time   CHOL 140 05/07/2021 0941   TRIG 103 05/07/2021 0941   HDL 52 05/07/2021 0941   CHOLHDL 3.2 10/15/2020 0225   VLDL 36 10/15/2020 0225   LDLCALC 69 05/07/2021 0941   LDLDIRECT 67 05/07/2021 0941   LABVLDL 19 05/07/2021 0941    No components found for: NTPROBNP No results for input(s): PROBNP in the last 8760  hours. No results for input(s): TSH in the last 8760 hours.  BMP Recent Labs    10/13/20 2200 10/14/20 0300 10/15/20 0225 11/08/20 0944 05/07/21 0941  NA 136 137 138 139 142  K 3.5 3.8 3.6 4.8 5.0  CL 102 105 106 103 105  CO2 25 24 23 23 24   GLUCOSE 115* 123* 157* 107* 122*  BUN 23 17 15 17 15   CREATININE 0.75 0.62 0.67 0.69 0.70  CALCIUM 10.2 9.7 9.9 10.1 10.4*  GFRNONAA >60 >60 >60  --   --     HEMOGLOBIN A1C Lab Results  Component Value Date   HGBA1C 6.2 (H) 10/15/2020   MPG 131.24 10/15/2020    IMPRESSION:    ICD-10-CM   1. Angina pectoris (HCC)  I20.9 ranolazine (RANEXA) 500 MG 12 hr tablet    CBC    Basic metabolic panel    Magnesium    Lipid Panel With LDL/HDL Ratio    LDL cholesterol, direct    nitroGLYCERIN (NITROSTAT) 0.4 MG SL tablet    2. Nonobstructive atherosclerosis of coronary artery  I25.10 Lipid Panel With LDL/HDL Ratio    LDL cholesterol, direct    nitroGLYCERIN (NITROSTAT) 0.4 MG SL tablet    3. Hx of non-ST elevation myocardial infarction (NSTEMI)  I25.2 Lipid Panel With LDL/HDL Ratio    LDL cholesterol, direct    nitroGLYCERIN (NITROSTAT) 0.4 MG SL tablet    4. Benign hypertension  I10 EKG 12-Lead    nitroGLYCERIN (NITROSTAT) 0.4 MG SL tablet    5. Dyspnea on exertion  R06.09     6. Coronary atherosclerosis due to calcified coronary lesion  I25.10    I25.84     7. Aortic atherosclerosis (HCC)  I70.0     8. Bilateral carotid artery stenosis  I65.23     9. Stenosis of left subclavian artery (HCC)  I77.1        RECOMMENDATIONS: Carmen Morgan is a 66 y.o. female whose past medical history and cardiac risk factors include: Mild nonobstructive CAD, mild coronary artery calcification, history of COVID-19 infection (January 2020 and July 2022), hypertension, on hormone replacement therapy, former smoker, postmenopausal female, advanced age, obesity due to excess calories.  Angina pectoris ([place]) Acute exacerbation of chronic  nonobstructive CAD Current symptoms are concerning for angina pectoris No active chest pain Patient does not want to be admitted electively for an expedited evaluation.  Patient states that she will seek medical attention if her symptoms radiate increase in intensity, frequency, duration. Patient stopped metoprolol secondary to dyspnea. Start Ranexa 500 mg p.o. twice daily Sublingual nitroglycerin tablets to use on a as needed basis.  Medication profile discussed. The procedure of left heart catheterization with possible intervention was explained to the patient in detail.  The indication, alternatives, risks and benefits were reviewed.  Complications include but not limited to bleeding, infection, vascular injury, stroke, myocardial infection, arrhythmia, kidney injury requiring short-term or long-term hemodialysis, radiation-related injury in the case of prolonged fluoroscopy use, emergency cardiac surgery, and death. The patient understands the risks of serious complication is 1-2 in 3474 with diagnostic cardiac cath and 1-2% or less with angioplasty/stenting.  The patient voices understanding and provides verbal feedback and wishes to proceed with coronary angiography with possible PCI.  Nonobstructive atherosclerosis of coronary artery / Coronary atherosclerosis due to calcified coronary lesion/  Hx of non-ST elevation myocardial infarction (NSTEMI) See above. Continue aspirin and statin therapy. Further recommendations to follow.  Benign hypertension Blood pressures are within acceptable range. Medications reconciled. Reemphasized importance of low-salt diet. No medication changes warranted during today's encounter.  Dyspnea on exertion Multifactorial. Ischemic work-up as outlined above. However, if patient does not have obstructive CAD would recommend pulmonary evaluation which I will defer to PCP. Further recommendations to follow.  Bilateral carotid artery stenosis  / Stenosis  of left subclavian artery (HCC) Currently on aspirin and statin therapy. Follows with vascular surgery   Dyspnea on exertion Clinically not  in congestive heart failure. Discontinue metoprolol Will restart lisinopril 20 mg p.o. every afternoon. Will restart HCTZ at 25 mg p.o. every morning Blood work in 1 week to evaluate kidney function and electrolytes Recently underwent an echocardiogram and coronary CTA results reviewed and noted above for further reference.  Nonobstructive atherosclerosis of coronary artery & Coronary atherosclerosis due to calcified coronary lesion without angina pectoris: Continue aspirin and statin therapy. Secondary prevention discussed. Recently has undergone an ischemic evaluation as discussed above.  Benign hypertension Office blood pressures are currently not at goal. Medication changes as discussed above. Continue to monitor.  Bilateral carotid artery stenosis and left subclavian artery stenosis: Currently on aspirin and statin therapy. Left subclavian stenosis may need to be accounted for if and when LIMA graft is considered for coronary reconstruction/revascularization. Follows up with vascular surgery.  During today's encounter discussed exacerbation of nonobstructive CAD which she presents with angina pectoris, independently ordered/reviewed EKG, reviewed prior diagnostic work-up including echo and coronary CTA results as part of medical decision making, prescription drugs provided as discussed above, discussed medication profile, and obtaining informed consent with regards to the upcoming left heart catheterization.  The plan of care and symptoms were also discussed with patient's husband who was present during today's encounter.  FINAL MEDICATION LIST END OF ENCOUNTER: Meds ordered this encounter  Medications   ranolazine (RANEXA) 500 MG 12 hr tablet    Sig: Take 1 tablet (500 mg total) by mouth 2 (two) times daily.    Dispense:  60 tablet     Refill:  0   nitroGLYCERIN (NITROSTAT) 0.4 MG SL tablet    Sig: Place 1 tablet (0.4 mg total) under the tongue every 5 (five) minutes as needed for chest pain. If you require more than two tablets five minutes apart go to the nearest ER via EMS.    Dispense:  30 tablet    Refill:  0    Medications Discontinued During This Encounter  Medication Reason   cephALEXin (KEFLEX) 500 MG capsule    clindamycin (CLINDAGEL) 1 % gel    doxycycline (VIBRAMYCIN) 100 MG capsule    lisinopril-hydrochlorothiazide (ZESTORETIC) 20-25 MG tablet    metoprolol tartrate (LOPRESSOR) 50 MG tablet    pantoprazole (PROTONIX) 40 MG tablet    predniSONE (STERAPRED UNI-PAK 21 TAB) 10 MG (21) TBPK tablet      Current Outpatient Medications:    acetaminophen (TYLENOL) 500 MG tablet, Take 1,000 mg by mouth every 6 (six) hours as needed for moderate pain or headache., Disp: , Rfl:    ascorbic acid (VITAMIN C) 250 MG CHEW, Chew 250 mg by mouth daily., Disp: , Rfl:    aspirin 81 MG EC tablet, Take 1 tablet (81 mg total) by mouth daily with breakfast. Swallow whole., Disp: 30 tablet, Rfl: 11   atorvastatin (LIPITOR) 20 MG tablet, Take 1 tablet (20 mg total) by mouth daily. (Patient not taking: Reported on 05/08/2021), Disp: 30 tablet, Rfl: 3   Cholecalciferol (VITAMIN D3) 50 MCG (2000 UT) TABS, Take 2,000 Units by mouth daily. , Disp: , Rfl:    estradiol (ESTRACE) 0.5 MG tablet, Take 0.5 mg by mouth at bedtime. , Disp: , Rfl:    fexofenadine (ALLEGRA) 180 MG tablet, Take 180 mg by mouth daily., Disp: , Rfl:    fluorouracil (EFUDEX) 5 % cream, Apply 1 application topically 2 (two) times daily. (Patient not taking: Reported on 05/08/2021), Disp: , Rfl:    fluticasone (FLONASE) 50 MCG/ACT nasal spray, Place 2 sprays  into both nostrils daily. , Disp: , Rfl:    hydrochlorothiazide (HYDRODIURIL) 25 MG tablet, TAKE 1 TABLET BY MOUTH EVERY DAY IN THE MORNING, Disp: 90 tablet, Rfl: 0   lisinopril (ZESTRIL) 20 MG tablet, TAKE 1 TABLET  (20 MG TOTAL) BY MOUTH DAILY AT 10 PM., Disp: 90 tablet, Rfl: 0   Melatonin 10 MG CAPS, Take 10 mg by mouth at bedtime., Disp: , Rfl:    nitroGLYCERIN (NITROSTAT) 0.4 MG SL tablet, Place 1 tablet (0.4 mg total) under the tongue every 5 (five) minutes as needed for chest pain. If you require more than two tablets five minutes apart go to the nearest ER via EMS., Disp: 30 tablet, Rfl: 0   omeprazole (PRILOSEC) 40 MG capsule, Take 40 mg by mouth 2 (two) times daily., Disp: , Rfl:    ranolazine (RANEXA) 500 MG 12 hr tablet, Take 1 tablet (500 mg total) by mouth 2 (two) times daily., Disp: 60 tablet, Rfl: 0   atorvastatin (LIPITOR) 20 MG tablet, Take 1 tablet (20 mg total) by mouth daily. (Patient taking differently: Take 20 mg by mouth at bedtime.), Disp: 30 tablet, Rfl: 11   Probiotic Product (PROBIOTIC DAILY PO), Take 1 tablet by mouth daily., Disp: , Rfl:   Orders Placed This Encounter  Procedures   CBC   Basic metabolic panel   Magnesium   Lipid Panel With LDL/HDL Ratio   LDL cholesterol, direct   EKG 12-Lead    There are no Patient Instructions on file for this visit.   --Continue cardiac medications as reconciled in final medication list. --Return in about 2 weeks (around 05/17/2021) for Follow up, Chest pain, Post heart catheterization. Or sooner if needed. --Continue follow-up with your primary care physician regarding the management of your other chronic comorbid conditions.  Patient's questions and concerns were addressed to her satisfaction. She voices understanding of the instructions provided during this encounter.   This note was created using a voice recognition software as a result there may be grammatical errors inadvertently enclosed that do not reflect the nature of this encounter. Every attempt is made to correct such errors.  Rex Kras, [place], [facility]  Pager: [phone] Office: [phone]

## 2021-05-15 ENCOUNTER — Encounter (HOSPITAL_COMMUNITY): Payer: Self-pay | Admitting: Cardiology

## 2021-05-23 ENCOUNTER — Other Ambulatory Visit: Payer: Self-pay

## 2021-05-23 ENCOUNTER — Ambulatory Visit: Payer: Medicare Other | Admitting: Cardiology

## 2021-05-23 ENCOUNTER — Encounter: Payer: Self-pay | Admitting: Cardiology

## 2021-05-23 VITALS — BP 166/78 | HR 100 | Temp 97.7°F | Resp 16 | Ht 61.0 in | Wt 187.2 lb

## 2021-05-23 DIAGNOSIS — R0609 Other forms of dyspnea: Secondary | ICD-10-CM

## 2021-05-23 DIAGNOSIS — I10 Essential (primary) hypertension: Secondary | ICD-10-CM

## 2021-05-23 DIAGNOSIS — I6523 Occlusion and stenosis of bilateral carotid arteries: Secondary | ICD-10-CM

## 2021-05-23 DIAGNOSIS — I252 Old myocardial infarction: Secondary | ICD-10-CM

## 2021-05-23 DIAGNOSIS — I700 Atherosclerosis of aorta: Secondary | ICD-10-CM

## 2021-05-23 DIAGNOSIS — I2510 Atherosclerotic heart disease of native coronary artery without angina pectoris: Secondary | ICD-10-CM

## 2021-05-23 DIAGNOSIS — I771 Stricture of artery: Secondary | ICD-10-CM

## 2021-05-23 MED ORDER — LISINOPRIL 40 MG PO TABS: 40.0000 mg | ORAL_TABLET | Freq: Every day | ORAL | 0 refills | Status: DC

## 2021-05-23 NOTE — Progress Notes (Signed)
ID:  Carmen Morgan, DOB 11-28-1955, MRN [id number]  PCP:  Etter Sjogren, FNP  Cardiologist:  Rex Kras, DO, [facility] (established care 10/14/2020)  Date: 05/23/21 Last Office Visit: 05/03/2021  Chief Complaint  Patient presents with   Chest Pain   Post Heart Catheterization   Follow-up    2 weeks    HPI  Carmen Morgan is a 66 y.o. female who presents to the office with a chief complaint of " reevaluation of chest pain/shortness of breath post heart catheterization" Patient's past medical history and cardiovascular risk factors include: Mild nonobstructive CAD, mild coronary artery calcification, history of COVID-19 infection (January 2020 and July 2022), hypertension, on hormone replacement therapy, former smoker, postmenopausal female, advanced age, obesity due to excess calories.  Establish care with the patient back in July 2022 when she presented to the ED as non-STEMI with intermittent left bundle branch block.  She underwent ischemic work-up including a coronary CTA which noted mild coronary calcification and nonobstructive CAD.  She was discharged home on medical therapy and educated in the importance of secondary prevention.  During the last office visit she presents for a regular follow-up but was concerned of ongoing chest tightness/heaviness and associated dyspnea on exertion.  Patient was concerned that something was missed back on the coronary CTA that was done in July 2022 and requested additional evaluation.  The shared decision was to proceed with left heart catheterization with possible intervention.  Patient underwent left heart catheterization since last office visit and was noted to have no significant epicardial coronary artery disease.  She was started on Imdur 30 mg p.o. daily for microvascular disease.  Clinically patient denies angina pectoris but does have dyspnea on exertion.  Patient denies orthopnea, paroxysmal nocturnal dyspnea or lower extremity  swelling.  FUNCTIONAL STATUS: No structured exercise program or daily routine.   ALLERGIES: Allergies  Allergen Reactions   Penicillins Hives    Did it involve swelling of the face/tongue/throat, SOB, or low BP? No Did it involve sudden or severe rash/hives, skin peeling, or any reaction on the inside of your mouth or nose? No Did you need to seek medical attention at a hospital or doctor's office? No When did it last happen?      childhood allergy  If all above answers are NO, may proceed with cephalosporin use.    Vitamin B12 Nausea And Vomiting   Vicks Nyquil Cough [Doxylamine-Dm] Palpitations    Irregular Heart Rate    MEDICATION LIST PRIOR TO VISIT: Current Meds  Medication Sig   acetaminophen (TYLENOL) 500 MG tablet Take 1,000 mg by mouth every 6 (six) hours as needed for moderate pain or headache.   ascorbic acid (VITAMIN C) 250 MG CHEW Chew 250 mg by mouth daily.   aspirin 81 MG EC tablet Take 1 tablet (81 mg total) by mouth daily with breakfast. Swallow whole.   atorvastatin (LIPITOR) 20 MG tablet Take 1 tablet (20 mg total) by mouth daily. (Patient taking differently: Take 20 mg by mouth at bedtime.)   Cholecalciferol (VITAMIN D3) 50 MCG (2000 UT) TABS Take 2,000 Units by mouth daily.    estradiol (ESTRACE) 0.5 MG tablet Take 0.5 mg by mouth at bedtime.    fexofenadine (ALLEGRA) 180 MG tablet Take 180 mg by mouth daily.   fluorouracil (EFUDEX) 5 % cream Apply 1 application. topically 2 (two) times daily.   fluticasone (FLONASE) 50 MCG/ACT nasal spray Place 2 sprays into both nostrils daily.    hydrochlorothiazide (HYDRODIURIL)  25 MG tablet TAKE 1 TABLET BY MOUTH EVERY DAY IN THE MORNING   isosorbide mononitrate (IMDUR) 30 MG 24 hr tablet Take 1 tablet (30 mg total) by mouth daily.   Melatonin 10 MG CAPS Take 10 mg by mouth at bedtime.   nitroGLYCERIN (NITROSTAT) 0.4 MG SL tablet Place 1 tablet (0.4 mg total) under the tongue every 5 (five) minutes as needed for chest  pain. If you require more than two tablets five minutes apart go to the nearest ER via EMS.   omeprazole (PRILOSEC) 40 MG capsule Take 40 mg by mouth 2 (two) times daily.   Probiotic Product (PROBIOTIC DAILY PO) Take 1 tablet by mouth daily.   [DISCONTINUED] lisinopril (ZESTRIL) 20 MG tablet TAKE 1 TABLET (20 MG TOTAL) BY MOUTH DAILY AT 10 PM.   [DISCONTINUED] ranolazine (RANEXA) 500 MG 12 hr tablet Take 1 tablet (500 mg total) by mouth 2 (two) times daily.     PAST MEDICAL HISTORY: Past Medical History:  Diagnosis Date   Coronary atherosclerosis due to calcified coronary lesion    GERD (gastroesophageal reflux disease)    History of COVID-04 April 2018 and July 2022.   Hyperlipidemia    Hypertension    Nonobstructive atherosclerosis of coronary artery    Seasonal allergies     PAST SURGICAL HISTORY: Past Surgical History:  Procedure Laterality Date   ABDOMINAL HYSTERECTOMY  2014   BACK SURGERY  2012   COLONOSCOPY N/A 02/16/2019   Procedure: COLONOSCOPY;  Surgeon: Daneil Dolin, MD;  Location: [facility];  Service: Endoscopy;  Laterality: N/A;  2:00   ESOPHAGOGASTRODUODENOSCOPY N/A 01/18/2020   Procedure: ESOPHAGOGASTRODUODENOSCOPY (EGD);  Surgeon: Daneil Dolin, MD;  Location: [facility];  Service: Endoscopy;  Laterality: N/A;  2:45pm   left breast cystectomy  1997   LEFT HEART CATH AND CORONARY ANGIOGRAPHY N/A 05/14/2021   Procedure: LEFT HEART CATH AND CORONARY ANGIOGRAPHY;  Surgeon: Nigel Mormon, MD;  Location: [place] [facility];  Service: Cardiovascular;  Laterality: N/A;   MALONEY DILATION N/A 01/18/2020   Procedure: Venia Minks DILATION;  Surgeon: Daneil Dolin, MD;  Location: [facility];  Service: Endoscopy;  Laterality: N/A;   URETHRAL SLING  07/2020   VEIN SURGERY  2013    FAMILY HISTORY: The patient family history includes Arthritis in her mother; CAD in her mother and sister; COPD in her sister; Cancer in her father; Diabetes in her mother  and sister; Heart attack in her mother; Hypertension in her father; Testicular cancer in her brother.  SOCIAL HISTORY:  The patient  reports that she quit smoking about 12 years ago. Her smoking use included cigarettes. She has a 20.00 pack-year smoking history. She has never used smokeless tobacco. She reports that she does not drink alcohol and does not use drugs.  REVIEW OF SYSTEMS: Review of Systems  Cardiovascular:  Positive for chest pain and dyspnea on exertion. Negative for leg swelling, near-syncope, orthopnea, palpitations and syncope.  Respiratory:  Positive for shortness of breath.    PHYSICAL EXAM: Vitals with BMI 05/23/2021 05/14/2021 05/14/2021  Height '5\' 1"'$  - -  Weight 187 lbs 3 oz - -  BMI 09.98 - -  Systolic 338 250 539  Diastolic 78 96 64  Pulse 767 100 101    CONSTITUTIONAL: Well-developed and well-nourished. No acute distress.  SKIN: Skin is warm and dry. No rash noted. No cyanosis. No pallor. No jaundice HEAD: Normocephalic and atraumatic.  EYES: No scleral  icterus MOUTH/THROAT: Moist oral membranes.  NECK: No JVD present. No thyromegaly noted.  Bilateral carotid bruits  LYMPHATIC: No visible cervical adenopathy.  CHEST Normal respiratory effort. No intercostal retractions  LUNGS: Clear to auscultation bilaterally.  No stridor. No wheezes. No rales.  CARDIOVASCULAR: Regular, positive S1-S2, no murmurs rubs or gallops appreciated. ABDOMINAL: Obese, soft, nontender, nondistended, positive bowel sounds in all 4 quadrants no apparent ascites.  EXTREMITIES: Left lower extremity edema 2+, right lower extremity edema 1+, warm to touch, difficult to appreciate DP and PT pulses secondary to swelling. HEMATOLOGIC: No significant bruising NEUROLOGIC: Oriented to person, place, and time. Nonfocal. Normal muscle tone.  PSYCHIATRIC: Normal mood and affect. Normal behavior. Cooperative  CARDIAC DATABASE: EKG: 05/03/2021: NSR, 85 bpm, left bundle branch  block.  Echocardiogram: 10/14/2020: LVEF 65-70%, no regional wall motion abnormalities, mild LVH, trivial MR.  Stress test: None  Heart catheterization: 05/14/2021: LM: Normal LAD: No significant disease Lcx: Mid 20% disease RCA: Prox 20% disease   LVEDP 26 mmHg No obstructive CAD  Recommend Imdur 30 mg for possible microvascular disease  Carotid Duplex 10/15/2020:  Right Carotid: Velocities in the right ICA are consistent with a 60-79% stenosis. The ECA appears >50% stenosed.  Left Carotid: Velocities in the left ICA are consistent with a 40-59%  stenosis. Non-hemodynamically significant plaque <50% noted in the CCA.  Vertebrals:  Right vertebral artery demonstrates antegrade flow. Left vertebral artery demonstrates retrograde flow.  Subclavians: Left subclavian artery was stenotic. Normal flow hemodynamics  were seen in the right subclavian artery.    Coronary CTA 10/15/2020: 1. Total coronary calcium score of 14.1. This was 60th percentile for age and sex matched control. 2. Normal coronary origin with right dominance. 3. CAD-RADS = 2. Minimal stenosis (0-24%) due to noncalcified plaque in the proximal LAD. Mild stenosis (25-49%) within the mid LCx due to mixed plaque. Minimal stenosis (0-24%) due to mixed plaque in proximal RCA. 4. Aortic atherosclerosis within descending aorta.  LABORATORY DATA: CBC Latest Ref Rng & Units 05/07/2021 10/16/2020 10/15/2020  WBC 3.4 - 10.8 x10E3/uL 7.0 7.5 7.2  Hemoglobin 11.1 - 15.9 g/dL 12.7 10.7(L) 11.0(L)  Hematocrit 34.0 - 46.6 % 38.6 33.9(L) 33.9(L)  Platelets 150 - 450 x10E3/uL 227 242 242    CMP Latest Ref Rng & Units 05/07/2021 11/08/2020 10/15/2020  Glucose 70 - 99 mg/dL 122(H) 107(H) 157(H)  BUN 8 - 27 mg/dL '15 17 15  '$ Creatinine 0.57 - 1.00 mg/dL 0.70 0.69 0.67  Sodium 134 - 144 mmol/L 142 139 138  Potassium 3.5 - 5.2 mmol/L 5.0 4.8 3.6  Chloride 96 - 106 mmol/L 105 103 106  CO2 20 - 29 mmol/L '24 23 23  '$ Calcium 8.7 - 10.3 mg/dL  10.4(H) 10.1 9.9  Total Protein 6.5 - 8.1 g/dL - - -  Total Bilirubin 0.3 - 1.2 mg/dL - - -  Alkaline Phos 38 - 126 U/L - - -  AST 15 - 41 U/L - - -  ALT 0 - 44 U/L - - -    Lipid Panel     Component Value Date/Time   CHOL 140 05/07/2021 0941   TRIG 103 05/07/2021 0941   HDL 52 05/07/2021 0941   CHOLHDL 3.2 10/15/2020 0225   VLDL 36 10/15/2020 0225   LDLCALC 69 05/07/2021 0941   LDLDIRECT 67 05/07/2021 0941   LABVLDL 19 05/07/2021 0941    No components found for: NTPROBNP No results for input(s): PROBNP in the last 8760 hours. No results for input(s): TSH  in the last 8760 hours.  BMP Recent Labs    10/13/20 2200 10/14/20 0300 10/15/20 0225 11/08/20 0944 05/07/21 0941  NA 136 137 138 139 142  K 3.5 3.8 3.6 4.8 5.0  CL 102 105 106 103 105  CO2 '25 24 23 23 24  '$ GLUCOSE 115* 123* 157* 107* 122*  BUN '23 17 15 17 15  '$ CREATININE 0.75 0.62 0.67 0.69 0.70  CALCIUM 10.2 9.7 9.9 10.1 10.4*  GFRNONAA >60 >60 >60  --   --     HEMOGLOBIN A1C Lab Results  Component Value Date   HGBA1C 6.2 (H) 10/15/2020   MPG 131.24 10/15/2020    IMPRESSION:    ICD-10-CM   1. Dyspnea on exertion  R06.09     2. Benign hypertension  I10 lisinopril (ZESTRIL) 40 MG tablet    Basic metabolic panel    3. Hx of non-ST elevation myocardial infarction (NSTEMI)  I25.2     4. Coronary atherosclerosis due to calcified coronary lesion  I25.10    I25.84     5. Aortic atherosclerosis (HCC)  I70.0     6. Bilateral carotid artery stenosis  I65.23     7. Stenosis of left subclavian artery (HCC)  I77.1        RECOMMENDATIONS: SHAJUAN MUSSO is a 66 y.o. female whose past medical history and cardiac risk factors include: Mild nonobstructive CAD, mild coronary artery calcification, history of COVID-19 infection (January 2020 and July 2022), hypertension, on hormone replacement therapy, former smoker, postmenopausal female, advanced age, obesity due to excess calories.  Dyspnea on  exertion Chronic and stable. Echo: LVEF 65 to 70%,  no significant valvular heart disease Coronary CTA: Mild CAC (total CAC 14.1 AU, 60th percentile), mild nonobstructive CAD. Left heart catheterization: No obstructive CAD. No use of sublingual nitroglycerin tablets last office visit. Recommend blood recommend better blood pressure management as discussed below.   Recommend discussing possible pulmonary evaluation with PCP for other noncardiac causes of dyspnea.  Benign hypertension We will increase lisinopril to 40 mg p.o. daily.  Labs in 1 week to evaluate kidney function. We will enroll the patient into principal care management. Further recommendations to follow.  Coronary atherosclerosis due to calcified coronary lesion/aortic atherosclerosis. Total CAC 14.1, 60th percentile. Continue aspirin and statin therapy  Bilateral carotid artery stenosis / Stenosis of left subclavian artery (HCC) Continue dual anticontinue aspirin and statin therapy. Follows with vascular surgery.    FINAL MEDICATION LIST END OF ENCOUNTER: Meds ordered this encounter  Medications   lisinopril (ZESTRIL) 40 MG tablet    Sig: Take 1 tablet (40 mg total) by mouth daily at 10 pm.    Dispense:  90 tablet    Refill:  0    Medications Discontinued During This Encounter  Medication Reason   ranolazine (RANEXA) 500 MG 12 hr tablet    lisinopril (ZESTRIL) 20 MG tablet      Current Outpatient Medications:    acetaminophen (TYLENOL) 500 MG tablet, Take 1,000 mg by mouth every 6 (six) hours as needed for moderate pain or headache., Disp: , Rfl:    ascorbic acid (VITAMIN C) 250 MG CHEW, Chew 250 mg by mouth daily., Disp: , Rfl:    aspirin 81 MG EC tablet, Take 1 tablet (81 mg total) by mouth daily with breakfast. Swallow whole., Disp: 30 tablet, Rfl: 11   atorvastatin (LIPITOR) 20 MG tablet, Take 1 tablet (20 mg total) by mouth daily. (Patient taking differently: Take 20 mg by mouth  at bedtime.), Disp: 30  tablet, Rfl: 11   Cholecalciferol (VITAMIN D3) 50 MCG (2000 UT) TABS, Take 2,000 Units by mouth daily. , Disp: , Rfl:    estradiol (ESTRACE) 0.5 MG tablet, Take 0.5 mg by mouth at bedtime. , Disp: , Rfl:    fexofenadine (ALLEGRA) 180 MG tablet, Take 180 mg by mouth daily., Disp: , Rfl:    fluorouracil (EFUDEX) 5 % cream, Apply 1 application. topically 2 (two) times daily., Disp: , Rfl:    fluticasone (FLONASE) 50 MCG/ACT nasal spray, Place 2 sprays into both nostrils daily. , Disp: , Rfl:    hydrochlorothiazide (HYDRODIURIL) 25 MG tablet, TAKE 1 TABLET BY MOUTH EVERY DAY IN THE MORNING, Disp: 90 tablet, Rfl: 0   isosorbide mononitrate (IMDUR) 30 MG 24 hr tablet, Take 1 tablet (30 mg total) by mouth daily., Disp: 30 tablet, Rfl: 2   Melatonin 10 MG CAPS, Take 10 mg by mouth at bedtime., Disp: , Rfl:    nitroGLYCERIN (NITROSTAT) 0.4 MG SL tablet, Place 1 tablet (0.4 mg total) under the tongue every 5 (five) minutes as needed for chest pain. If you require more than two tablets five minutes apart go to the nearest ER via EMS., Disp: 30 tablet, Rfl: 0   omeprazole (PRILOSEC) 40 MG capsule, Take 40 mg by mouth 2 (two) times daily., Disp: , Rfl:    Probiotic Product (PROBIOTIC DAILY PO), Take 1 tablet by mouth daily., Disp: , Rfl:    lisinopril (ZESTRIL) 40 MG tablet, Take 1 tablet (40 mg total) by mouth daily at 10 pm., Disp: 90 tablet, Rfl: 0  Orders Placed This Encounter  Procedures   Basic metabolic panel    There are no Patient Instructions on file for this visit.   --Continue cardiac medications as reconciled in final medication list. --Return in about 3 months (around 08/23/2021) for Follow up, Dyspnea. Or sooner if needed. --Continue follow-up with your primary care physician regarding the management of your other chronic comorbid conditions.  Patient's questions and concerns were addressed to her satisfaction. She voices understanding of the instructions provided during this encounter.    This note was created using a voice recognition software as a result there may be grammatical errors inadvertently enclosed that do not reflect the nature of this encounter. Every attempt is made to correct such errors.  Rex Kras, [place], [facility]  Pager: [phone] Office: [phone]

## 2021-05-28 ENCOUNTER — Ambulatory Visit: Payer: Medicare Other | Admitting: Gastroenterology

## 2021-05-30 ENCOUNTER — Other Ambulatory Visit: Payer: Self-pay | Admitting: Cardiology

## 2021-05-31 ENCOUNTER — Other Ambulatory Visit: Payer: Medicare Other

## 2021-05-31 LAB — BASIC METABOLIC PANEL
BUN/Creatinine Ratio: 29 — ABNORMAL HIGH (ref 12–28)
BUN: 18 mg/dL (ref 8–27)
CO2: 25 mmol/L (ref 20–29)
Calcium: 10.4 mg/dL — ABNORMAL HIGH (ref 8.7–10.3)
Chloride: 102 mmol/L (ref 96–106)
Creatinine, Ser: 0.63 mg/dL (ref 0.57–1.00)
Glucose: 135 mg/dL — ABNORMAL HIGH (ref 70–99)
Potassium: 4.2 mmol/L (ref 3.5–5.2)
Sodium: 139 mmol/L (ref 134–144)
eGFR: 97 mL/min/{1.73_m2} (ref >59)

## 2021-06-13 ENCOUNTER — Ambulatory Visit
Admission: RE | Admit: 2021-06-13 | Discharge: 2021-06-13 | Disposition: A | Discharge status: Home / Self Care | Payer: Medicare Other | Source: Ambulatory Visit | Attending: Emergency Medicine | Admitting: Emergency Medicine

## 2021-06-13 DIAGNOSIS — D329 Benign neoplasm of meninges, unspecified: Secondary | ICD-10-CM

## 2021-06-13 IMAGING — MR MR HEAD WO/W CM
16 series · 48 of 48 positions shown · IV contrast (14 ml multihance)
Comparison: Brain MRI [DATE] (images available, report
unavailable).

CLINICAL DATA: Provided history: Meningioma. Additional history
provided by scanning technologist: Balance difficulty.

EXAM:
MRI HEAD WITHOUT AND WITH CONTRAST
TECHNIQUE: Multiplanar, multiecho pulse sequences of the brain and surrounding
structures were obtained without and with intravenous contrast.
CONTRAST:  14mL MULTIHANCE GADOBENATE DIMEGLUMINE 529 MG/ML IV SOLN

[Series 2: T1 · sagittal · 5.0mm · 0.45mm/px · 2 of 25 slices shown]
[im 1/25]
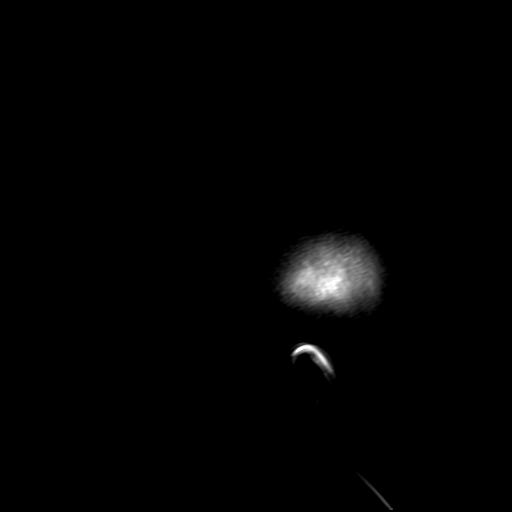
[im 25/25]
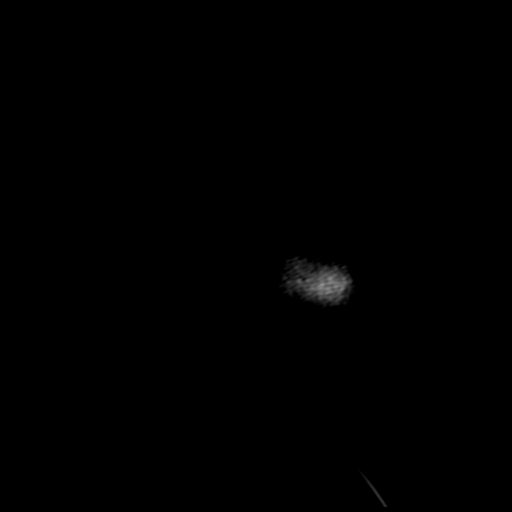

[Series 3: ax ep2d_diff_3 · axial · 3.0mm · 1.80mm/px · z∈[-85,+63]mm · 5 of 103 slices shown]
[im 1/103]
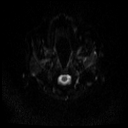
[im 26/103]
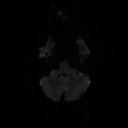
[im 52/103]
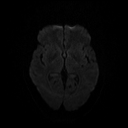
[im 77/103]
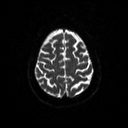
[im 103/103]
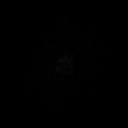

[Series 4: ax ep2d_diff_3_adc · axial · 3.0mm · 1.80mm/px · z∈[-85,+63]mm · 2 of 52 slices shown]
[im 1/52]
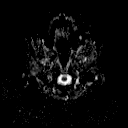
[im 52/52]
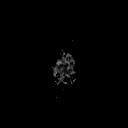

[Series 5: cor ep2d_diff · coronal · 5.0mm · 1.77mm/px · 3 of 59 slices shown]
[im 1/59]
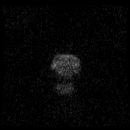
[im 30/59]
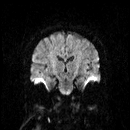
[im 59/59]
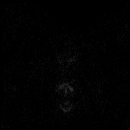

[Series 6: cor ep2d_diff_adc · coronal · 5.0mm · 1.77mm/px · 1 of 30 slices shown]
[im 1/30]
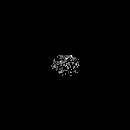

[Series 8: swi_images · axial · 2.0mm · 0.98mm/px · z∈[-74,+64]mm · 3 of 72 slices shown]
[im 1/72]
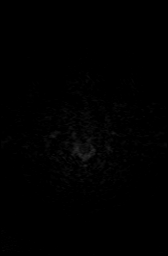
[im 36/72]
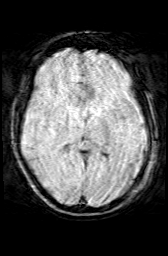
[im 72/72]
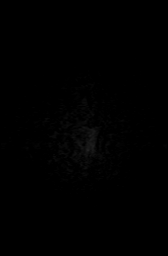

[Series 10: T2 · axial · 5.0mm · 0.65mm/px · 1 of 26 slices shown (1 of 3)]
[im 1/26]
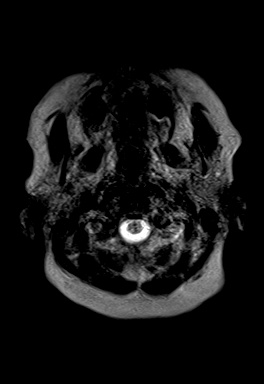

[Series 11: t1_mpr_tra · axial · 1.0mm · 0.78mm/px · z∈[-85,+69]mm · 8 of 160 slices shown (1 of 2)]
[im 1/160]
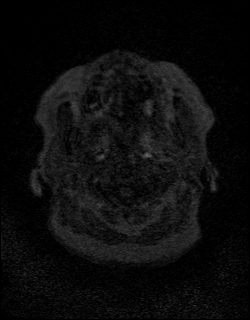
[im 23/160]
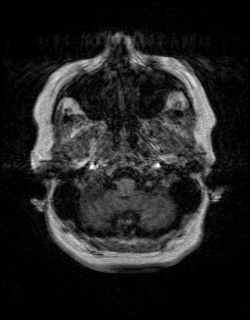
[im 46/160]
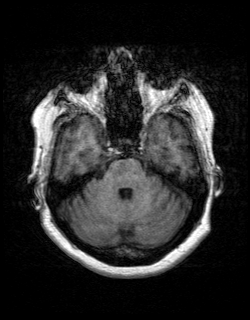
[im 69/160]
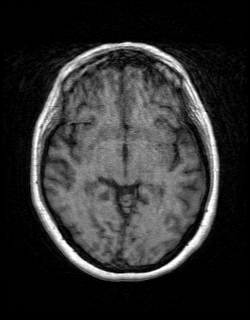
[im 91/160]
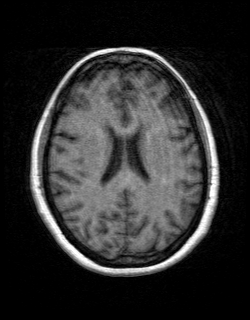
[im 114/160]
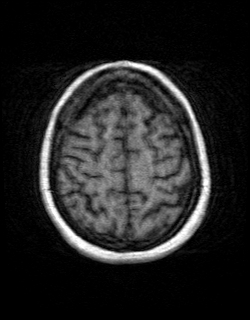
[im 137/160]
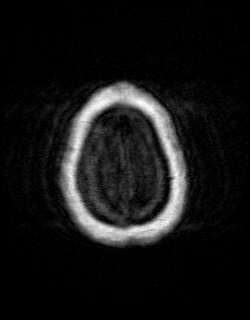
[im 160/160]
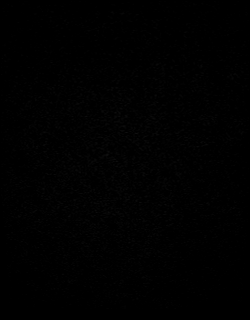

[Series 12: FLAIR · axial · 3.0mm · 0.45mm/px · z∈[-81,+66]mm · 2 of 40 slices shown]
[im 1/40]
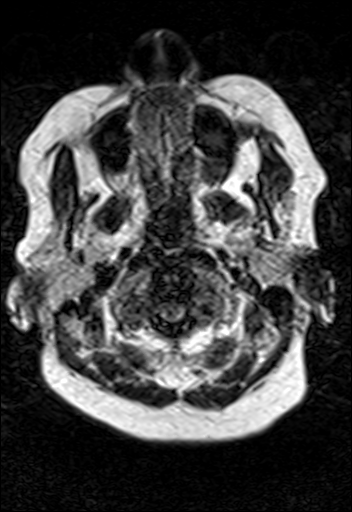
[im 40/40]
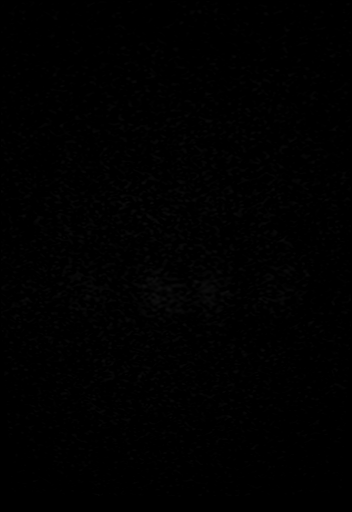

[Series 13: T2 · axial · 5.0mm · 0.65mm/px · 1 of 26 slices shown (2 of 3)]
[im 1/26]
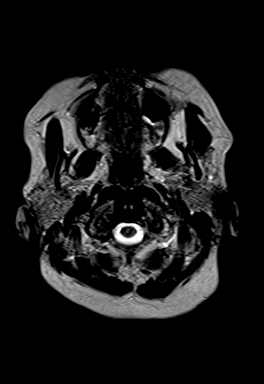

[Series 14: t1_mpr_tra · axial · 1.0mm · 0.78mm/px · z∈[-85,+69]mm · 8 of 160 slices shown (2 of 2)]
[im 1/160]
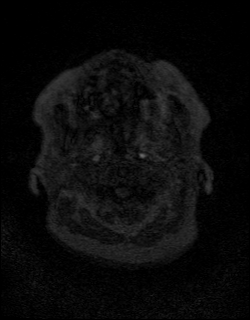
[im 23/160]
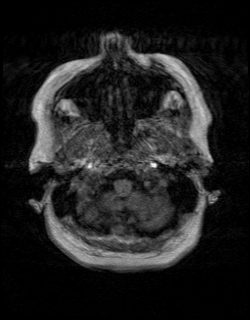
[im 46/160]
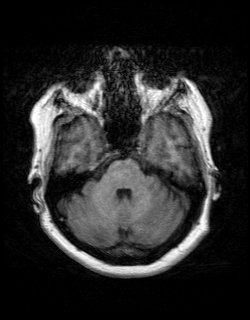
[im 69/160]
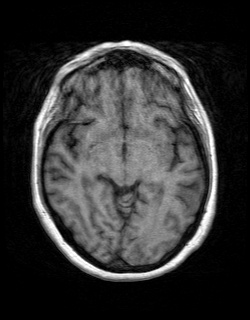
[im 91/160]
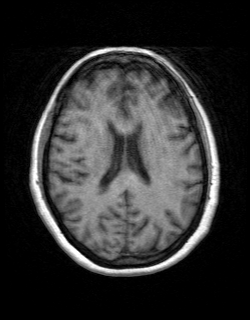
[im 114/160]
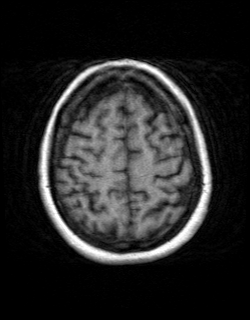
[im 137/160]
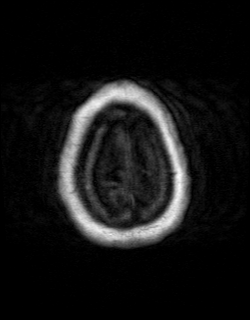
[im 160/160]
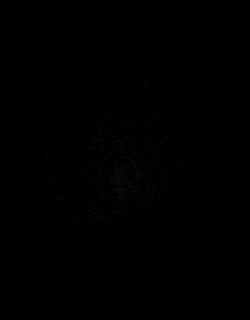

[Series 15: T2 · coronal · 5.0mm · 0.43mm/px · 1 of 30 slices shown (3 of 3)]
[im 1/30]
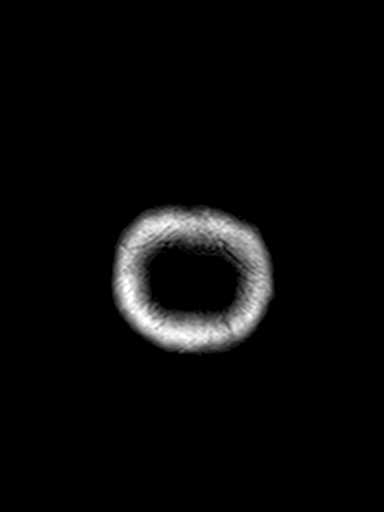

[Series 16: post t1_mpr_tra · axial · 1.0mm · 0.78mm/px · z∈[-85,+69]mm · 8 of 160 slices shown]
[im 1/160]
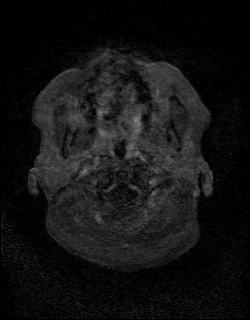
[im 23/160]
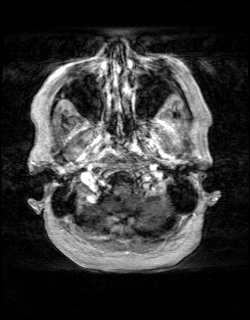
[im 46/160]
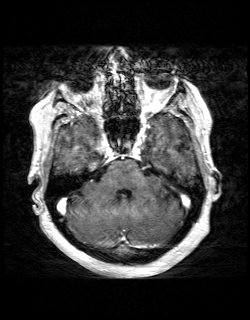
[im 69/160]
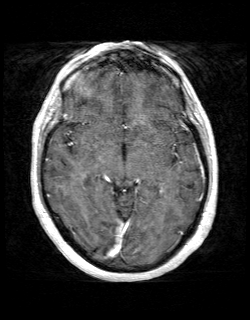
[im 91/160]
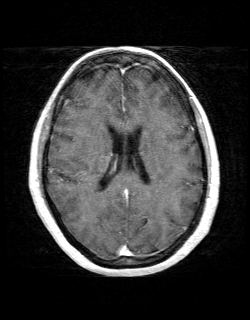
[im 114/160]
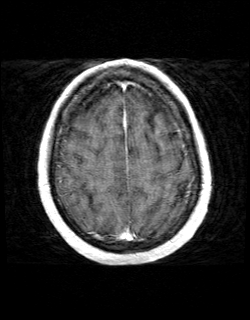
[im 137/160]
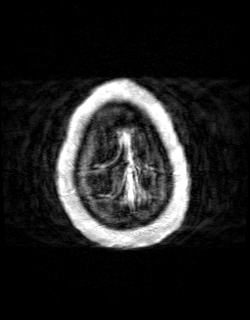
[im 160/160]
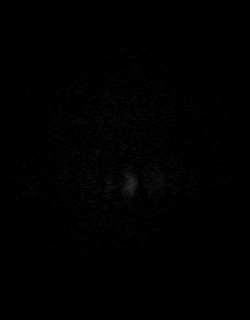

[Series 17: T1 post-contrast · coronal · 5.0mm · 0.72mm/px · 1 of 30 slices shown]
[im 1/30]
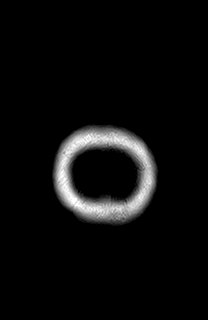

[Series 18: t1_se_sag post · sagittal · 5.0mm · 0.45mm/px · 1 of 25 slices shown (1 of 2)]
[im 1/25]
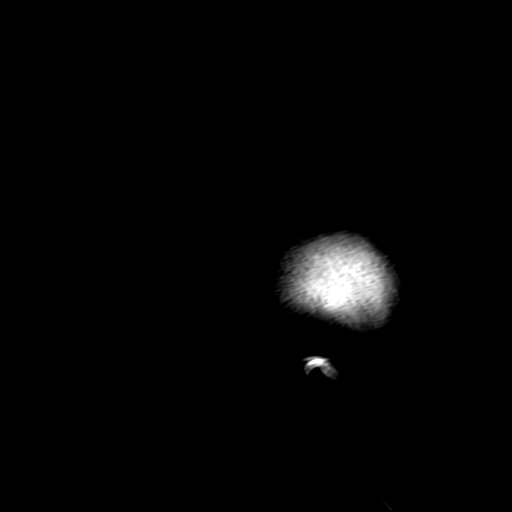

[Series 19: t1_se_sag post · sagittal · 5.0mm · 0.45mm/px · 1 of 25 slices shown (2 of 2)]
[im 1/25]
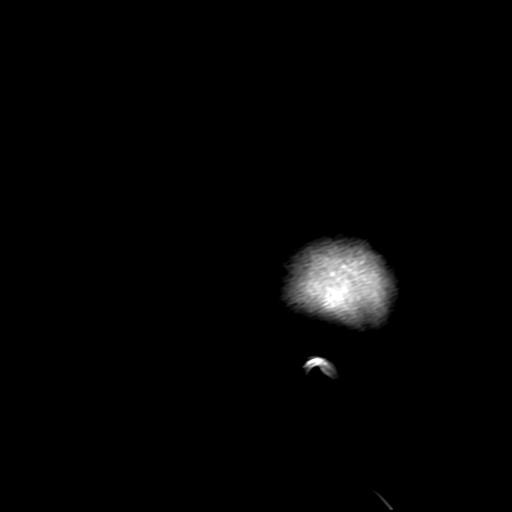

[48 of 48 positions shown; findings below may reference images not displayed]

FINDINGS: The examination is intermittently motion degraded, limiting
evaluation. Most notably, there is moderate motion degradation of
the axial SWI sequence, moderate motion degradation of the least
motion degraded axial T2 TSE sequence, moderate motion degradation
of the axial T1 weighted sequence, moderate/severe motion
degradation of the coronal T2 TSE sequence, moderate/severe motion
degradation of the axial T1 weighted post-contrast sequence,
moderate/severe motion degradation of the coronal T1 weighted
post-contrast sequence and moderate motion degradation of the least
motion degraded sagittal T1 weighted post-contrast sequence.

Brain:

No age-advanced or lobar predominant atrophy.

Redemonstrated 10 x 7 mm enhancing extra-axial dural-based mass
overlying the posterior left frontal lobe and anterior left parietal
lobe. The imaging features are most compatible with a meningioma.
The mass has not appreciably changed in size from the prior brain
MRI of [DATE]. As before, there is contact upon the underlying
left frontoparietal lobes without significant mass effect. No
underlying parenchymal edema.

Mild multifocal T2 FLAIR hyperintense signal abnormality within the
cerebral white matter, nonspecific but compatible with chronic small
vessel ischemic disease.

There is no acute infarct.

No chronic intracranial blood products.

No extra-axial fluid collection.

No midline shift.

Vascular: Maintained flow voids within the proximal large arterial
vessels.

Skull and upper cervical spine: No focal suspicious marrow lesion.

Sinuses/Orbits: Visualized orbits show no acute finding. Minimal
mucosal thickening within the bilateral ethmoid sinuses, right
sphenoid sinus and bilateral maxillary sinuses.
IMPRESSION: Motion degraded exam.

10 x 7 mm enhancing extra-axial dural-based mass overlying the left
frontoparietal lobes, unchanged in size from the prior brain MRI of
[DATE]. The imaging features are most compatible with a
meningioma. As before, there is contact upon the underlying left
frontoparietal lobes without significant mass effect. No underlying
parenchymal edema.

Mild chronic small vessel ischemic changes within the cerebral white
matter, stable.

Mild paranasal sinus disease, as described.

## 2021-06-13 MED ORDER — GADOBENATE DIMEGLUMINE 529 MG/ML IV SOLN
14.0000 mL | Freq: Once | INTRAVENOUS | Status: AC | PRN
  Administered 2021-06-13: 14 mL via INTRAVENOUS

## 2021-06-14 NOTE — Progress Notes (Signed)
Called pt no answer, left a vm

## 2021-06-14 NOTE — Progress Notes (Signed)
Called and spoke with patient regarding her recent lab results.

## 2021-06-17 ENCOUNTER — Other Ambulatory Visit: Payer: Self-pay | Admitting: Gastroenterology

## 2021-06-20 ENCOUNTER — Other Ambulatory Visit: Payer: Self-pay | Admitting: Cardiology

## 2021-06-20 DIAGNOSIS — I10 Essential (primary) hypertension: Secondary | ICD-10-CM

## 2021-06-20 MED ORDER — LISINOPRIL 40 MG PO TABS: 40.0000 mg | ORAL_TABLET | Freq: Every day | ORAL | 0 refills | Status: DC

## 2021-06-24 ENCOUNTER — Other Ambulatory Visit: Payer: Self-pay

## 2021-07-19 ENCOUNTER — Other Ambulatory Visit: Payer: Self-pay | Admitting: Cardiology

## 2021-07-19 DIAGNOSIS — I10 Essential (primary) hypertension: Secondary | ICD-10-CM

## 2021-08-05 ENCOUNTER — Other Ambulatory Visit: Payer: Self-pay

## 2021-08-05 MED ORDER — ISOSORBIDE MONONITRATE ER 30 MG PO TB24: 30.0000 mg | ORAL_TABLET | Freq: Every day | ORAL | 0 refills | Status: DC

## 2021-08-08 ENCOUNTER — Telehealth: Payer: Self-pay

## 2021-08-08 ENCOUNTER — Ambulatory Visit (INDEPENDENT_AMBULATORY_CARE_PROVIDER_SITE_OTHER): Payer: Medicare Other | Admitting: Gastroenterology

## 2021-08-08 ENCOUNTER — Encounter: Payer: Self-pay | Admitting: Gastroenterology

## 2021-08-08 VITALS — BP 150/62 | HR 84 | Temp 97.5°F | Ht 61.0 in | Wt 185.8 lb

## 2021-08-08 DIAGNOSIS — I6523 Occlusion and stenosis of bilateral carotid arteries: Secondary | ICD-10-CM | POA: Diagnosis not present

## 2021-08-08 DIAGNOSIS — R1030 Lower abdominal pain, unspecified: Secondary | ICD-10-CM | POA: Diagnosis not present

## 2021-08-08 DIAGNOSIS — R140 Abdominal distension (gaseous): Secondary | ICD-10-CM

## 2021-08-08 MED ORDER — PANTOPRAZOLE SODIUM 40 MG PO TBEC: 40.0000 mg | DELAYED_RELEASE_TABLET | Freq: Two times a day (BID) | ORAL | 3 refills | Status: DC

## 2021-08-08 MED ORDER — DICYCLOMINE HCL 10 MG PO CAPS: 10.0000 mg | ORAL_CAPSULE | Freq: Three times a day (TID) | ORAL | 3 refills | Status: DC

## 2021-08-08 NOTE — Progress Notes (Signed)
Gastroenterology Office Note     Primary Care Physician:  Etter Sjogren, FNP  Primary Gastroenterologist: Dr. Gala Romney   Chief Complaint   Chief Complaint  Patient presents with   Gastroesophageal Reflux    Needs refills on pantoprazole.      History of Present Illness   Carmen Morgan is a 66 y.o. female presenting today in follow-up with a history of GERD, bloating, and frequent stools. December 2020 with diverticulosis in the entire examined colon and otherwise normal   Had frequent stool this morning. Mon Tues frequent stool nothing Wednesday. Milk products: takes lactaid but will still have diarrhea the next day. If eats cold cheese, she's fine. If hot, then will have diarrhea. Salads can cause loose stool.   Started taking a probiotic about 6 months ago. Stool was never pieced together. Since started probiotic is more solid. This morning had solid BM but then after had multiple stools that were soft. Has been going on since hysterectomy in 2014. Lower abdomen feels sore all the time. Feels bloated all the time. Supplemental fiber backfires and causes worse bloating. Feels productive when having a BM. A few times a year will have lots of cramping until she gets everything out of her.      Past Medical History:  Diagnosis Date   Coronary atherosclerosis due to calcified coronary lesion    GERD (gastroesophageal reflux disease)    History of COVID-04 April 2018 and July 2022.   Hyperlipidemia    Hypertension    Nonobstructive atherosclerosis of coronary artery    Seasonal allergies     Past Surgical History:  Procedure Laterality Date   ABDOMINAL HYSTERECTOMY  2014   BACK SURGERY  2012   COLONOSCOPY N/A 02/16/2019   pancolonic diverticulosis   ESOPHAGOGASTRODUODENOSCOPY N/A 01/18/2020   Procedure: ESOPHAGOGASTRODUODENOSCOPY (EGD);  Surgeon: Daneil Dolin, MD;  Location: [facility];  Service: Endoscopy;  Laterality: N/A;  2:45pm   left breast  cystectomy  1997   LEFT HEART CATH AND CORONARY ANGIOGRAPHY N/A 05/14/2021   Procedure: LEFT HEART CATH AND CORONARY ANGIOGRAPHY;  Surgeon: Nigel Mormon, MD;  Location: [facility];  Service: Cardiovascular;  Laterality: N/A;   MALONEY DILATION N/A 01/18/2020   Procedure: Venia Minks DILATION;  Surgeon: Daneil Dolin, MD;  Location: [facility];  Service: Endoscopy;  Laterality: N/A;   URETHRAL SLING  07/2020   VEIN SURGERY  2013    Current Outpatient Medications  Medication Sig Dispense Refill   acetaminophen (TYLENOL) 500 MG tablet Take 1,000 mg by mouth every 6 (six) hours as needed for moderate pain or headache.     ascorbic acid (VITAMIN C) 250 MG CHEW Chew 250 mg by mouth daily.     aspirin 81 MG EC tablet Take 1 tablet (81 mg total) by mouth daily with breakfast. Swallow whole. 30 tablet 11   atorvastatin (LIPITOR) 20 MG tablet Take 1 tablet (20 mg total) by mouth daily. (Patient taking differently: Take 20 mg by mouth at bedtime.) 30 tablet 11   Cholecalciferol (VITAMIN D3) 50 MCG (2000 UT) TABS Take 2,000 Units by mouth daily.      dicyclomine (BENTYL) 10 MG capsule Take 1 capsule (10 mg total) by mouth 4 (four) times daily -  before meals and at bedtime. For abdominal cramping and frequent stools 120 capsule 3   estradiol (ESTRACE) 0.5 MG tablet Take 0.5 mg by mouth at bedtime.  fexofenadine (ALLEGRA) 180 MG tablet Take 180 mg by mouth daily.     fluticasone (FLONASE) 50 MCG/ACT nasal spray Place 2 sprays into both nostrils daily.      hydrochlorothiazide (HYDRODIURIL) 25 MG tablet TAKE 1 TABLET BY MOUTH EVERY DAY IN THE MORNING 90 tablet 0   isosorbide mononitrate (IMDUR) 30 MG 24 hr tablet Take 1 tablet (30 mg total) by mouth daily. 30 tablet 0   lisinopril (ZESTRIL) 40 MG tablet Take 1 tablet (40 mg total) by mouth daily at 10 pm. 90 tablet 0   Melatonin 10 MG CAPS Take 10 mg by mouth at bedtime.     nitroGLYCERIN (NITROSTAT) 0.4 MG SL tablet Place 1 tablet  (0.4 mg total) under the tongue every 5 (five) minutes as needed for chest pain. If you require more than two tablets five minutes apart go to the nearest ER via EMS. 30 tablet 0   Probiotic Product (PROBIOTIC DAILY PO) Take 1 tablet by mouth daily.     pantoprazole (PROTONIX) 40 MG tablet Take 1 tablet (40 mg total) by mouth 2 (two) times daily. 180 tablet 3   No current facility-administered medications for this visit.    Allergies as of 08/08/2021 - Review Complete 08/08/2021  Allergen Reaction Noted   Penicillins Hives 10/29/2018   Vitamin b12 Nausea And Vomiting 10/29/2018   Vicks nyquil cough [doxylamine-dm] Palpitations 05/08/2021    Family History  Problem Relation Age of Onset   Diabetes Mother    Heart attack Mother    CAD Mother    Arthritis Mother    Cancer Father    Hypertension Father    COPD Sister    CAD Sister    Diabetes Sister    Testicular cancer Brother    Colon cancer Neg Hx     Social History   Socioeconomic History   Marital status: Married    Spouse name: Not on file   Number of children: 1   Years of education: Not on file   Highest education level: Not on file  Occupational History   Not on file  Tobacco Use   Smoking status: Former    Packs/day: 0.50    Years: 40.00    Pack years: 20.00    Types: Cigarettes    Quit date: 04/25/2009    Years since quitting: 12.2   Smokeless tobacco: Never  Vaping Use   Vaping Use: Former  Substance and Sexual Activity   Alcohol use: Never   Drug use: Never   Sexual activity: Yes  Other Topics Concern   Not on file  Social History Narrative   Not on file   Social Determinants of Health   Financial Resource Strain: Not on file  Food Insecurity: Not on file  Transportation Needs: Not on file  Physical Activity: Not on file  Stress: Not on file  Social Connections: Not on file  Intimate Partner Violence: Not on file     Review of Systems   Gen: Denies any fever, chills, fatigue, weight  loss, lack of appetite.  CV: Denies chest pain, heart palpitations, peripheral edema, syncope.  Resp: Denies shortness of breath at rest or with exertion. Denies wheezing or cough.  GI: see HPI GU : Denies urinary burning, urinary frequency, urinary hesitancy MS: Denies joint pain, muscle weakness, cramps, or limitation of movement.  Derm: Denies rash, itching, dry skin Psych: Denies depression, anxiety, memory loss, and confusion Heme: Denies bruising, bleeding, and enlarged lymph nodes.   Physical  Exam   BP (!) 150/62 (BP Location: Right Arm, Patient Position: Sitting, Cuff Size: Normal)   Pulse 84   Temp (!) 97.5 F (36.4 C) (Temporal)   Ht '5\' 1"'$  (1.549 m)   Wt 185 lb 12.8 oz (84.3 kg)   SpO2 97%   BMI 35.11 kg/m  General:   Alert and oriented. Pleasant and cooperative. Well-nourished and well-developed.  Head:  Normocephalic and atraumatic. Eyes:  Without icterus Abdomen:  +BS, soft, TTP lower abdomen and non-distended. No HSM noted. No guarding or rebound. No masses appreciated.  Rectal:  Deferred  Msk:  Symmetrical without gross deformities. Normal posture. Extremities:  Without edema. Neurologic:  Alert and  oriented x4;  grossly normal neurologically. Skin:  Intact without significant lesions or rashes. Psych:  Alert and cooperative. Normal mood and affect.   Assessment   Carmen Morgan is a 66 y.o. female presenting today in follow-up with a history of  GERD, bloating, and frequent stools. December 2020 with diverticulosis in the entire examined colon and otherwise normal  GERD: refill PPI BID.  Abdominal bloating: chronic and worsened after hysterectomy in 2014. Notes chronic lower abdomen tenderness to touch. Frequent, soft stools throughout the day. Associated cramping. Likely dealing with IBS. Will order celiac serologies to be thorough. She also desires a CT scan, which we will arrange. Can trial low dose Bentyl prn. Fiber has not been helpful in the past and  actually worsened symptoms.    PLAN    CT abd/pelvis Celiac serologies Trial of Bentyl prn. Discussed starting with low dosing and possible side effects Further recommendations to follow    Annitta Needs, PhD, ANP-BC [facility]

## 2021-08-08 NOTE — Patient Instructions (Signed)
Please have blood work done when you are able.  I have ordered a CT scan of your abdomen.  I have sent in a medication called dicyclomine. Although it is written as four times a day, start off by just taking as needed for abdominal cramping and looser stool before a meal. You can take it up to 4 times a day; however, side effects can include constipation, dry mouth, dizziness. Start off with taking just once as needed.   Further recommendations to follow!  It was a pleasure to see you today. I want to create trusting relationships with patients to provide genuine, compassionate, and quality care. I value your feedback. If you receive a survey regarding your visit,  I greatly appreciate you taking time to fill this out.   Annitta Needs, PhD, ANP-BC [facility]

## 2021-08-08 NOTE — Telephone Encounter (Signed)
CT abd/pelvis w/contrast scheduled for 09/19/21 at 2:00pm, arrive at 1:45pm. Pick up contrast before day of test. NPO 4 hours prior to test.  CT abd/pelvis appt letter mailed to pt.

## 2021-08-09 LAB — TISSUE TRANSGLUTAMINASE, IGA: (tTG) Ab, IgA: <1 U/mL

## 2021-08-09 LAB — IGA: Immunoglobulin A: 177 mg/dL (ref 70–320)

## 2021-08-23 ENCOUNTER — Encounter: Payer: Self-pay | Admitting: Cardiology

## 2021-08-23 ENCOUNTER — Ambulatory Visit: Payer: Medicare Other | Admitting: Cardiology

## 2021-08-23 VITALS — BP 123/59 | HR 82 | Temp 98.0°F | Resp 16 | Ht 61.0 in | Wt 187.4 lb

## 2021-08-23 DIAGNOSIS — I771 Stricture of artery: Secondary | ICD-10-CM

## 2021-08-23 DIAGNOSIS — I252 Old myocardial infarction: Secondary | ICD-10-CM

## 2021-08-23 DIAGNOSIS — R0609 Other forms of dyspnea: Secondary | ICD-10-CM

## 2021-08-23 DIAGNOSIS — I6523 Occlusion and stenosis of bilateral carotid arteries: Secondary | ICD-10-CM

## 2021-08-23 DIAGNOSIS — I2510 Atherosclerotic heart disease of native coronary artery without angina pectoris: Secondary | ICD-10-CM

## 2021-08-23 DIAGNOSIS — I700 Atherosclerosis of aorta: Secondary | ICD-10-CM

## 2021-08-23 DIAGNOSIS — I10 Essential (primary) hypertension: Secondary | ICD-10-CM

## 2021-08-23 MED ORDER — LISINOPRIL 40 MG PO TABS: 40.0000 mg | ORAL_TABLET | Freq: Every day | ORAL | 1 refills | Status: DC

## 2021-08-23 MED ORDER — ISOSORBIDE MONONITRATE ER 30 MG PO TB24: 30.0000 mg | ORAL_TABLET | Freq: Every day | ORAL | 1 refills | Status: DC

## 2021-08-23 NOTE — Progress Notes (Signed)
ID:  [name], DOB [birth year], MRN [id number]  PCP:  Etter Sjogren, FNP  Cardiologist:  Rex Kras, DO, [facility] (established care 10/14/2020)  Date: 08/23/21 Last Office Visit: 05/23/2021  Chief Complaint  Patient presents with   Shortness of Breath   Follow-up    HPI  Carmen Morgan is a 66 y.o. female whose past medical history and cardiovascular risk factors include: Mild nonobstructive CAD, mild coronary artery calcification, history of COVID-19 infection (January 2020 and July 2022), hypertension, on hormone replacement therapy, former smoker, postmenopausal female, advanced age, obesity due to excess calories.  Presented to the ED in July 2022 and ruled in for non-STEMI.  She underwent coronary CTA which noted mild CAC and mild nonobstructive CAD.  She was started on guideline directed medical therapy.  On subsequent follow-up visits patient continues to have precordial discomfort and dyspnea on exertion and was concerned that something was missed on the coronary CTA and requested to undergo left heart catheterization.  She was found to have no significant epicardial coronary disease and thereafter was started on Imdur for microvascular disease/dysfunction.  She now presents for follow-up.  Over the last 3 months patient is doing well overall while at rest.  However patient states that when she overexerts herself she continues to have precordial discomfort, dyspnea on exertion, and jaw pain.  At the last office visit she was recommended to follow-up with pulmonary medicine for evaluation of dyspnea but this is still pending.  Patient states that she does work for a [employer], community and has been evaluated for TMJ given his jaw discomfort.  She has an upcoming appointment with vascular surgery in August 2023.  FUNCTIONAL STATUS: No structured exercise program or daily routine.   ALLERGIES: Allergies  Allergen Reactions   Penicillins Hives    Did it involve swelling of the  face/tongue/throat, SOB, or low BP? No Did it involve sudden or severe rash/hives, skin peeling, or any reaction on the inside of your mouth or nose? No Did you need to seek medical attention at a hospital or doctor's office? No When did it last happen?      childhood allergy  If all above answers are "NO", may proceed with cephalosporin use.    Vitamin B12 Nausea And Vomiting   Vicks Nyquil Cough [Doxylamine-Dm] Palpitations    Irregular Heart Rate    MEDICATION LIST PRIOR TO VISIT: Current Meds  Medication Sig   acetaminophen (TYLENOL) 500 MG tablet Take 1,000 mg by mouth every 6 (six) hours as needed for moderate pain or headache.   ascorbic acid (VITAMIN C) 250 MG CHEW Chew 250 mg by mouth daily.   aspirin 81 MG EC tablet Take 1 tablet (81 mg total) by mouth daily with breakfast. Swallow whole.   atorvastatin (LIPITOR) 20 MG tablet Take 1 tablet (20 mg total) by mouth daily. (Patient taking differently: Take 20 mg by mouth at bedtime.)   Cholecalciferol (VITAMIN D3) 50 MCG (2000 UT) TABS Take 2,000 Units by mouth daily.    estradiol (ESTRACE) 0.5 MG tablet Take 0.5 mg by mouth at bedtime.    fexofenadine (ALLEGRA) 180 MG tablet Take 180 mg by mouth daily.   fluticasone (FLONASE) 50 MCG/ACT nasal spray Place 2 sprays into both nostrils daily.    hydrochlorothiazide (HYDRODIURIL) 25 MG tablet TAKE 1 TABLET BY MOUTH EVERY DAY IN THE MORNING   Melatonin 10 MG CAPS Take 10 mg by mouth at bedtime.   nitroGLYCERIN (NITROSTAT) 0.4 MG SL tablet Place  1 tablet (0.4 mg total) under the tongue every 5 (five) minutes as needed for chest pain. If you require more than two tablets five minutes apart go to the nearest ER via EMS.   pantoprazole (PROTONIX) 40 MG tablet Take 1 tablet (40 mg total) by mouth 2 (two) times daily.   Probiotic Product (PROBIOTIC DAILY PO) Take 1 tablet by mouth daily.   [DISCONTINUED] isosorbide mononitrate (IMDUR) 30 MG 24 hr tablet Take 1 tablet (30 mg total) by mouth  daily.   [DISCONTINUED] lisinopril (ZESTRIL) 40 MG tablet Take 1 tablet (40 mg total) by mouth daily at 10 pm.     PAST MEDICAL HISTORY: Past Medical History:  Diagnosis Date   Coronary atherosclerosis due to calcified coronary lesion    GERD (gastroesophageal reflux disease)    History of COVID-04 April 2018 and July 2022.   Hyperlipidemia    Hypertension    Nonobstructive atherosclerosis of coronary artery    Seasonal allergies     PAST SURGICAL HISTORY: Past Surgical History:  Procedure Laterality Date   ABDOMINAL HYSTERECTOMY  2014   BACK SURGERY  2012   COLONOSCOPY N/A 02/16/2019   pancolonic diverticulosis   ESOPHAGOGASTRODUODENOSCOPY N/A 01/18/2020   Procedure: ESOPHAGOGASTRODUODENOSCOPY (EGD);  Surgeon: Daneil Dolin, MD;  [facility]: [facility];  Service: Endoscopy;  Laterality: N/A;  2:45pm   left breast cystectomy  1997   LEFT HEART CATH AND CORONARY ANGIOGRAPHY N/A 05/14/2021   Procedure: LEFT HEART CATH AND CORONARY ANGIOGRAPHY;  Surgeon: Nigel Mormon, MD;  Location: [facility];  Service: Cardiovascular;  Laterality: N/A;   MALONEY DILATION N/A 01/18/2020   Procedure: Venia Minks DILATION;  Surgeon: Daneil Dolin, MD;  Location: [facility];  Service: Endoscopy;  Laterality: N/A;   URETHRAL SLING  07/2020   VEIN SURGERY  2013    FAMILY HISTORY: The patient family history includes Arthritis in her mother; CAD in her mother and sister; COPD in her sister; Cancer in her father; Diabetes in her mother and sister; Heart attack in her mother; Hypertension in her father; Testicular cancer in her brother.  SOCIAL HISTORY:  The patient  reports that she quit smoking about 12 years ago. Her smoking use included cigarettes. She has a 20.00 pack-year smoking history. She has never used smokeless tobacco. She reports that she does not drink alcohol and does not use drugs.  REVIEW OF SYSTEMS: Review of Systems  Cardiovascular:  Positive for chest  pain. Negative for dyspnea on exertion, leg swelling, near-syncope, orthopnea, palpitations, paroxysmal nocturnal dyspnea and syncope.  Respiratory:  Positive for shortness of breath.     PHYSICAL EXAM:    08/23/2021    1:46 PM 08/08/2021   10:37 AM 05/23/2021    1:46 PM  Vitals with BMI  Height '5\' 1"'$  '5\' 1"'$  '5\' 1"'$   Weight 187 lbs 6 oz 185 lbs 13 oz 187 lbs 3 oz  BMI 35.43 27.25 36.64  Systolic 403 474 259  Diastolic 59 62 78  Pulse 82 84 100    CONSTITUTIONAL: Well-developed and well-nourished. No acute distress.  SKIN: Skin is warm and dry. No rash noted. No cyanosis. No pallor. No jaundice HEAD: Normocephalic and atraumatic.  EYES: No scleral icterus MOUTH/THROAT: Moist oral membranes.  NECK: No JVD present. No thyromegaly noted.  Bilateral carotid bruits  LYMPHATIC: No visible cervical adenopathy.  CHEST Normal respiratory effort. No intercostal retractions  LUNGS: Clear to auscultation bilaterally.  No stridor. No wheezes. No  rales.  CARDIOVASCULAR: Regular, positive S1-S2, no murmurs rubs or gallops appreciated. ABDOMINAL: Obese, soft, nontender, nondistended, positive bowel sounds in all 4 quadrants no apparent ascites.  EXTREMITIES: Left lower extremity edema 2+, right lower extremity edema 1+, warm to touch, difficult to appreciate DP and PT pulses secondary to swelling. HEMATOLOGIC: No significant bruising NEUROLOGIC: Oriented to person, place, and time. Nonfocal. Normal muscle tone.  PSYCHIATRIC: Normal mood and affect. Normal behavior. Cooperative  CARDIAC DATABASE: EKG: 05/03/2021: NSR, 85 bpm, left bundle branch block.  Echocardiogram: 10/14/2020: LVEF 65-70%, no regional wall motion abnormalities, mild LVH, trivial MR.  Stress test: None  Heart catheterization: 05/14/2021: LM: Normal LAD: No significant disease Lcx: Mid 20% disease RCA: Prox 20% disease   LVEDP 26 mmHg No obstructive CAD  Recommend Imdur 30 mg for possible microvascular disease  Carotid  Duplex 10/15/2020:  Right Carotid: Velocities in the right ICA are consistent with a 60-79% stenosis. The ECA appears >50% stenosed.  Left Carotid: Velocities in the left ICA are consistent with a 40-59%  stenosis. Non-hemodynamically significant plaque <50% noted in the CCA.  Vertebrals:  Right vertebral artery demonstrates antegrade flow. Left vertebral artery demonstrates retrograde flow.  Subclavians: Left subclavian artery was stenotic. Normal flow hemodynamics  were seen in the right subclavian artery.    Coronary CTA 10/15/2020: 1. Total coronary calcium score of 14.1. This was 60th percentile for age and sex matched control. 2. Normal coronary origin with right dominance. 3. CAD-RADS = 2. Minimal stenosis (0-24%) due to noncalcified plaque in the proximal LAD. Mild stenosis (25-49%) within the mid LCx due to mixed plaque. Minimal stenosis (0-24%) due to mixed plaque in proximal RCA. 4. Aortic atherosclerosis within descending aorta.  LABORATORY DATA:    Latest Ref Rng & Units 05/07/2021    9:41 AM 10/16/2020    3:48 AM 10/15/2020    2:25 AM  CBC  WBC 3.4 - 10.8 x10E3/uL 7.0  7.5  7.2   Hemoglobin 11.1 - 15.9 g/dL 12.7  10.7  11.0   Hematocrit 34.0 - 46.6 % 38.6  33.9  33.9   Platelets 150 - 450 x10E3/uL 227  242  242        Latest Ref Rng & Units 05/30/2021    9:39 AM 05/07/2021    9:41 AM 11/08/2020    9:44 AM  CMP  Glucose 70 - 99 mg/dL 135  122  107   BUN 8 - 27 mg/dL '18  15  17   '$ Creatinine 0.57 - 1.00 mg/dL 0.63  0.70  0.69   Sodium 134 - 144 mmol/L 139  142  139   Potassium 3.5 - 5.2 mmol/L 4.2  5.0  4.8   Chloride 96 - 106 mmol/L 102  105  103   CO2 20 - 29 mmol/L '25  24  23   '$ Calcium 8.7 - 10.3 mg/dL 10.4  10.4  10.1     Lipid Panel     Component Value Date/Time   CHOL 140 05/07/2021 0941   TRIG 103 05/07/2021 0941   HDL 52 05/07/2021 0941   CHOLHDL 3.2 10/15/2020 0225   VLDL 36 10/15/2020 0225   LDLCALC 69 05/07/2021 0941   LDLDIRECT 67 05/07/2021 0941    LABVLDL 19 05/07/2021 0941    No components found for: "NTPROBNP" No results for input(s): "PROBNP" in the last 8760 hours. No results for input(s): "TSH" in the last 8760 hours.  BMP Recent Labs    10/13/20 2200 10/14/20 0300 10/15/20  0225 11/08/20 0944 05/07/21 0941 05/30/21 0939  NA 136 137 138 139 142 139  K 3.5 3.8 3.6 4.8 5.0 4.2  CL 102 105 106 103 105 102  CO2 '25 24 23 23 24 25  '$ GLUCOSE 115* 123* 157* 107* 122* 135*  BUN '23 17 15 17 15 18  '$ CREATININE 0.75 0.62 0.67 0.69 0.70 0.63  CALCIUM 10.2 9.7 9.9 10.1 10.4* 10.4*  GFRNONAA >60 >60 >60  --   --   --     HEMOGLOBIN A1C Lab Results  Component Value Date   HGBA1C 6.2 (H) 10/15/2020   MPG 131.24 10/15/2020    IMPRESSION:    ICD-10-CM   1. Dyspnea on exertion  R06.09     2. Nonobstructive atherosclerosis of coronary artery  I25.10 isosorbide mononitrate (IMDUR) 30 MG 24 hr tablet    3. Coronary atherosclerosis due to calcified coronary lesion  I25.10 isosorbide mononitrate (IMDUR) 30 MG 24 hr tablet   I25.84     4. Hx of non-ST elevation myocardial infarction (NSTEMI)  I25.2     5. Benign hypertension  I10 lisinopril (ZESTRIL) 40 MG tablet    DISCONTINUED: lisinopril (ZESTRIL) 40 MG tablet    6. Aortic atherosclerosis (HCC)  I70.0     7. Bilateral carotid artery stenosis  I65.23     8. Stenosis of left subclavian artery (HCC)  I77.1         RECOMMENDATIONS: Carmen Morgan is a 66 y.o. female whose past medical history and cardiac risk factors include: Mild nonobstructive CAD, mild coronary artery calcification, history of COVID-19 infection (January 2020 and July 2022), hypertension, on hormone replacement therapy, former smoker, postmenopausal female, advanced age, obesity due to excess calories.  Dyspnea on exertion / Nonobstructive atherosclerosis of coronary artery /mild CAC/history of NSTEMI  Chronic and stable. Echo: LVEF 65 to 70%, no significant valvular heart disease. Coronary CTA: Mild  CAC (total CAC 14.1 AU, 60th percentile) mild nonobstructive CAD. Due to exertional chest pain, shortness of breath and exertional jaw pain despite the noninvasive work-up she underwent left heart catheterization to rule out balanced ischemia.  Her left heart catheterization noted no significant epicardial coronary disease. She was started on Imdur for microvascular dysfunction and has tolerated the medication well.  She has not required any sublingual nitroglycerin tablets.  Patient states that she is doing well with regular activity and at rest.  When she overexerts she continues to have chest pain and shortness of breath.  Prior diagnostic work-up reviewed with the patient and her husband at today's visit.  Would recommend continued medical therapy for now.  I have also discussed with them to consider a second opinion with another cardiologist to see if additional testing is warranted.  In the interim I have asked her to increase her physical activity as tolerated with a goal of 30 minutes a day 5 days a week.  Continue current medical therapy to help improve her modifiable cardiovascular risk factors.  Benign hypertension Improved. Patient has tolerated up titration of lisinopril to 40 mg p.o. daily.  Repeat labs from March 2023 independently reviewed which notes stable renal function. Lisinopril refilled  Bilateral carotid artery stenosis / Stenosis of left subclavian artery (HCC) Currently on aspirin and statin therapy. Follows up with vascular surgery in August 2023.   FINAL MEDICATION LIST END OF ENCOUNTER: Meds ordered this encounter  Medications   DISCONTD: lisinopril (ZESTRIL) 40 MG tablet    Sig: Take 1 tablet (40 mg total) by mouth  daily at 10 pm.    Dispense:  90 tablet    Refill:  1   isosorbide mononitrate (IMDUR) 30 MG 24 hr tablet    Sig: Take 1 tablet (30 mg total) by mouth daily.    Dispense:  90 tablet    Refill:  1   lisinopril (ZESTRIL) 40 MG tablet    Sig: Take 1  tablet (40 mg total) by mouth daily at 10 pm.    Dispense:  90 tablet    Refill:  1    Medications Discontinued During This Encounter  Medication Reason   lisinopril (ZESTRIL) 40 MG tablet Reorder   isosorbide mononitrate (IMDUR) 30 MG 24 hr tablet Reorder   lisinopril (ZESTRIL) 40 MG tablet       Current Outpatient Medications:    acetaminophen (TYLENOL) 500 MG tablet, Take 1,000 mg by mouth every 6 (six) hours as needed for moderate pain or headache., Disp: , Rfl:    ascorbic acid (VITAMIN C) 250 MG CHEW, Chew 250 mg by mouth daily., Disp: , Rfl:    aspirin 81 MG EC tablet, Take 1 tablet (81 mg total) by mouth daily with breakfast. Swallow whole., Disp: 30 tablet, Rfl: 11   atorvastatin (LIPITOR) 20 MG tablet, Take 1 tablet (20 mg total) by mouth daily. (Patient taking differently: Take 20 mg by mouth at bedtime.), Disp: 30 tablet, Rfl: 11   Cholecalciferol (VITAMIN D3) 50 MCG (2000 UT) TABS, Take 2,000 Units by mouth daily. , Disp: , Rfl:    estradiol (ESTRACE) 0.5 MG tablet, Take 0.5 mg by mouth at bedtime. , Disp: , Rfl:    fexofenadine (ALLEGRA) 180 MG tablet, Take 180 mg by mouth daily., Disp: , Rfl:    fluticasone (FLONASE) 50 MCG/ACT nasal spray, Place 2 sprays into both nostrils daily. , Disp: , Rfl:    hydrochlorothiazide (HYDRODIURIL) 25 MG tablet, TAKE 1 TABLET BY MOUTH EVERY DAY IN THE MORNING, Disp: 90 tablet, Rfl: 0   Melatonin 10 MG CAPS, Take 10 mg by mouth at bedtime., Disp: , Rfl:    nitroGLYCERIN (NITROSTAT) 0.4 MG SL tablet, Place 1 tablet (0.4 mg total) under the tongue every 5 (five) minutes as needed for chest pain. If you require more than two tablets five minutes apart go to the nearest ER via EMS., Disp: 30 tablet, Rfl: 0   pantoprazole (PROTONIX) 40 MG tablet, Take 1 tablet (40 mg total) by mouth 2 (two) times daily., Disp: 180 tablet, Rfl: 3   Probiotic Product (PROBIOTIC DAILY PO), Take 1 tablet by mouth daily., Disp: , Rfl:    dicyclomine (BENTYL) 10 MG  capsule, Take 1 capsule (10 mg total) by mouth 4 (four) times daily -  before meals and at bedtime. For abdominal cramping and frequent stools (Patient not taking: Reported on 08/23/2021), Disp: 120 capsule, Rfl: 3   isosorbide mononitrate (IMDUR) 30 MG 24 hr tablet, Take 1 tablet (30 mg total) by mouth daily., Disp: 90 tablet, Rfl: 1   lisinopril (ZESTRIL) 40 MG tablet, Take 1 tablet (40 mg total) by mouth daily at 10 pm., Disp: 90 tablet, Rfl: 1  No orders of the defined types were placed in this encounter.   There are no Patient Instructions on file for this visit.   --Continue cardiac medications as reconciled in final medication list. --Return in about 1 year (around 08/24/2022) for Follow up history of non-STEMI and nonobstructive CAD. Or sooner if needed. --Continue follow-up with your primary care physician regarding the  management of your other chronic comorbid conditions.  Patient's questions and concerns were addressed to her satisfaction. She voices understanding of the instructions provided during this encounter.   This note was created using a voice recognition software as a result there may be grammatical errors inadvertently enclosed that do not reflect the nature of this encounter. Every attempt is made to correct such errors.  Rex Kras, [place], [facility]  Pager: [phone] Office: [phone]

## 2021-09-19 ENCOUNTER — Ambulatory Visit (HOSPITAL_COMMUNITY)
Admission: RE | Admit: 2021-09-19 | Discharge: 2021-09-19 | Disposition: A | Discharge status: Home / Self Care | Payer: Medicare Other | Source: Ambulatory Visit | Attending: Gastroenterology | Admitting: Gastroenterology

## 2021-09-19 DIAGNOSIS — R1030 Lower abdominal pain, unspecified: Secondary | ICD-10-CM | POA: Insufficient documentation

## 2021-09-19 DIAGNOSIS — R140 Abdominal distension (gaseous): Secondary | ICD-10-CM | POA: Diagnosis present

## 2021-09-19 MED ORDER — IOHEXOL 300 MG/ML  SOLN
100.0000 mL | Freq: Once | INTRAMUSCULAR | Status: AC | PRN
  Administered 2021-09-19: 100 mL via INTRAVENOUS

## 2021-09-24 LAB — POCT I-STAT CREATININE: Creatinine, Ser: 0.8 mg/dL (ref 0.44–1.00)

## 2021-09-25 ENCOUNTER — Other Ambulatory Visit: Payer: Self-pay | Admitting: *Deleted

## 2021-09-25 DIAGNOSIS — I6523 Occlusion and stenosis of bilateral carotid arteries: Secondary | ICD-10-CM

## 2021-09-26 ENCOUNTER — Other Ambulatory Visit: Payer: Self-pay | Admitting: *Deleted

## 2021-09-26 ENCOUNTER — Telehealth: Payer: Self-pay

## 2021-09-26 DIAGNOSIS — R9389 Abnormal findings on diagnostic imaging of other specified body structures: Secondary | ICD-10-CM

## 2021-09-26 NOTE — Telephone Encounter (Signed)
Pt called stating that her appt to have imaging is scheduled for 10/11/21 and she is wanting to know if triazolam 0.25 mg can be called in for her to take before having it done due to being a little claustrophobic.

## 2021-10-08 MED ORDER — TRIAZOLAM 0.25 MG PO TABS: 0.2500 mg | ORAL_TABLET | Freq: Once | ORAL | 0 refills | Status: DC

## 2021-10-08 NOTE — Telephone Encounter (Signed)
I have printed prescription. Please let her know I was out of the office on vacation. Need to have it faxed to her pharmacy. Thanks!

## 2021-10-08 NOTE — Telephone Encounter (Signed)
Phoned and advised the pt of Rx being sent to her pharmacy and that Vicente Males was out of town (which she knew). She stated this is a reminder call to [place]

## 2021-10-08 NOTE — Telephone Encounter (Signed)
Pt called again wanting a Rx for Triazolam 0.25 mg for procedure coming up on July 28th .

## 2021-10-08 NOTE — Addendum Note (Signed)
Addended by: Annitta Needs on: 10/08/2021 02:23 PM   Modules accepted: Orders

## 2021-10-09 ENCOUNTER — Ambulatory Visit (HOSPITAL_COMMUNITY): Payer: Medicare Other

## 2021-10-11 ENCOUNTER — Ambulatory Visit (HOSPITAL_COMMUNITY)
Admission: RE | Admit: 2021-10-11 | Discharge: 2021-10-11 | Disposition: A | Discharge status: Home / Self Care | Payer: Medicare Other | Source: Ambulatory Visit | Attending: Gastroenterology | Admitting: Gastroenterology

## 2021-10-11 DIAGNOSIS — R9389 Abnormal findings on diagnostic imaging of other specified body structures: Secondary | ICD-10-CM | POA: Insufficient documentation

## 2021-10-11 MED ORDER — GADOBUTROL 1 MMOL/ML IV SOLN
10.0000 mL | Freq: Once | INTRAVENOUS | Status: AC | PRN
  Administered 2021-10-11: 10 mL via INTRAVENOUS

## 2021-10-13 ENCOUNTER — Other Ambulatory Visit: Payer: Self-pay | Admitting: Cardiology

## 2021-10-13 DIAGNOSIS — I10 Essential (primary) hypertension: Secondary | ICD-10-CM

## 2021-10-16 ENCOUNTER — Ambulatory Visit: Payer: Medicare Other

## 2021-10-16 ENCOUNTER — Encounter (HOSPITAL_COMMUNITY): Payer: Medicare Other

## 2021-11-13 ENCOUNTER — Encounter (HOSPITAL_COMMUNITY): Payer: Medicare Other

## 2021-11-13 ENCOUNTER — Ambulatory Visit: Payer: Medicare Other

## 2021-11-26 NOTE — Progress Notes (Unsigned)
HISTORY AND PHYSICAL     CC:  follow up. Requesting Provider:  Etter Sjogren, FNP  HPI: This is a 66 y.o. female here for follow up for carotid artery stenosis.  Pt has hx of  nonobstructive coronary artery atherosclerosis.  Risk factors include hypertension previous history of smoking.    Pt was last seen 11/02/2020 and at that time she did not have any neurological sx.  Her carotid duplex revealed bilateral carotid artery stenosis and left SCA stenosis.  She was asymptomatic.  She did have a palpable left radial pulse to suggest she had good collateralization.  She was compliant with asa/statin.  Pt returns today for follow up.    Pt denies any amaurosis fugax, speech difficulties, weakness, numbness, paralysis or clumsiness or facial droop.   She denies any claudication or rest pain or non healing wounds.  She does have nocturnal leg cramping.    She states that she does have angina.  She states that she has seen cardiology and they have told her it is not her heart.  She did have cardiac catheterization earlier this year and found to have no obstructive CAD and was started on Imdur '30mg'$  for possible microvascular disease. She is frustrated b/c she cannot walk more than 7 minutes without her chest hurting.  She states she has had CT scans and other tests in the past.  She has also been evaluated by GI for this.   They cannot figure out why she has pain.  Pt has hx of Covid 19 in 2020 and 2022. She has not been to pulmonologist.    She states that she has some numbness in both hands and was told by neurology this was carpal tunnel.  She still works in Engineer, mining at [employer] and would rather not have surgery for this currently.   The pt is on a statin for cholesterol management.  The pt is on a daily aspirin.   Other AC:  none The pt is on diuretic, ACEI for hypertension.   The pt does not have diabetes Tobacco hx:  former  Pt does not have family hx of AAA.  Past Medical History:   Diagnosis Date   Coronary atherosclerosis due to calcified coronary lesion    GERD (gastroesophageal reflux disease)    History of COVID-04 April 2018 and July 2022.   Hyperlipidemia    Hypertension    Nonobstructive atherosclerosis of coronary artery    Seasonal allergies     Past Surgical History:  Procedure Laterality Date   ABDOMINAL HYSTERECTOMY  2014   BACK SURGERY  2012   COLONOSCOPY N/A 02/16/2019   pancolonic diverticulosis   ESOPHAGOGASTRODUODENOSCOPY N/A 01/18/2020   Procedure: ESOPHAGOGASTRODUODENOSCOPY (EGD);  Surgeon: Daneil Dolin, MD;  [facility];  Service: Endoscopy;  Laterality: N/A;  2:45pm   left breast cystectomy  1997   LEFT HEART CATH AND CORONARY ANGIOGRAPHY N/A 05/14/2021   Procedure: LEFT HEART CATH AND CORONARY ANGIOGRAPHY;  Surgeon: Nigel Mormon, MD;  Location: [facility];  Service: Cardiovascular;  Laterality: N/A;   MALONEY DILATION N/A 01/18/2020   Procedure: Venia Minks DILATION;  Surgeon: Daneil Dolin, MD;  Location: [facility];  Service: Endoscopy;  Laterality: N/A;   URETHRAL SLING  07/2020   VEIN SURGERY  2013    Allergies  Allergen Reactions   Penicillins Hives    Did it involve swelling of the face/tongue/throat, SOB, or low BP? No Did it  involve sudden or severe rash/hives, skin peeling, or any reaction on the inside of your mouth or nose? No Did you need to seek medical attention at a hospital or doctor's office? No When did it last happen?      childhood allergy  If all above answers are "NO", may proceed with cephalosporin use.    Vitamin B12 Nausea And Vomiting   Vicks Nyquil Cough [Doxylamine-Dm] Palpitations    Irregular Heart Rate    Current Outpatient Medications  Medication Sig Dispense Refill   acetaminophen (TYLENOL) 500 MG tablet Take 1,000 mg by mouth every 6 (six) hours as needed for moderate pain or headache.     ascorbic acid (VITAMIN C) 250 MG CHEW Chew 250 mg by mouth daily.      aspirin 81 MG EC tablet Take 1 tablet (81 mg total) by mouth daily with breakfast. Swallow whole. 30 tablet 11   atorvastatin (LIPITOR) 20 MG tablet Take 1 tablet (20 mg total) by mouth daily. (Patient taking differently: Take 20 mg by mouth at bedtime.) 30 tablet 11   Cholecalciferol (VITAMIN D3) 50 MCG (2000 UT) TABS Take 2,000 Units by mouth daily.      dicyclomine (BENTYL) 10 MG capsule Take 1 capsule (10 mg total) by mouth 4 (four) times daily -  before meals and at bedtime. For abdominal cramping and frequent stools (Patient not taking: Reported on 08/23/2021) 120 capsule 3   estradiol (ESTRACE) 0.5 MG tablet Take 0.5 mg by mouth at bedtime.      fexofenadine (ALLEGRA) 180 MG tablet Take 180 mg by mouth daily.     fluticasone (FLONASE) 50 MCG/ACT nasal spray Place 2 sprays into both nostrils daily.      hydrochlorothiazide (HYDRODIURIL) 25 MG tablet TAKE 1 TABLET BY MOUTH EVERY DAY IN THE MORNING 90 tablet 0   isosorbide mononitrate (IMDUR) 30 MG 24 hr tablet Take 1 tablet (30 mg total) by mouth daily. 90 tablet 1   lisinopril (ZESTRIL) 40 MG tablet Take 1 tablet (40 mg total) by mouth daily at 10 pm. 90 tablet 1   Melatonin 10 MG CAPS Take 10 mg by mouth at bedtime.     nitroGLYCERIN (NITROSTAT) 0.4 MG SL tablet Place 1 tablet (0.4 mg total) under the tongue every 5 (five) minutes as needed for chest pain. If you require more than two tablets five minutes apart go to the nearest ER via EMS. 30 tablet 0   pantoprazole (PROTONIX) 40 MG tablet Take 1 tablet (40 mg total) by mouth 2 (two) times daily. 180 tablet 3   Probiotic Product (PROBIOTIC DAILY PO) Take 1 tablet by mouth daily.     triazolam (HALCION) 0.25 MG tablet Take 1 tablet (0.25 mg total) by mouth once for 1 dose. 30 minutes prior to MRI. 1 tablet 0   No current facility-administered medications for this visit.    Family History  Problem Relation Age of Onset   Diabetes Mother    Heart attack Mother    CAD Mother    Arthritis  Mother    Cancer Father    Hypertension Father    COPD Sister    CAD Sister    Diabetes Sister    Testicular cancer Brother    Colon cancer Neg Hx     Social History   Socioeconomic History   Marital status: Married    Spouse name: Not on file   Number of children: 1   Years of education: Not on file  Highest education level: Not on file  Occupational History   Not on file  Tobacco Use   Smoking status: Former    Packs/day: 0.50    Years: 40.00    Total pack years: 20.00    Types: Cigarettes    Quit date: 04/25/2009    Years since quitting: 12.5   Smokeless tobacco: Never  Vaping Use   Vaping Use: Former  Substance and Sexual Activity   Alcohol use: Never   Drug use: Never   Sexual activity: Yes  Other Topics Concern   Not on file  Social History Narrative   Not on file   Social Determinants of Health   Financial Resource Strain: Not on file  Food Insecurity: Not on file  Transportation Needs: Not on file  Physical Activity: Not on file  Stress: Not on file  Social Connections: Not on file  Intimate Partner Violence: Not on file     REVIEW OF SYSTEMS:   '[X]'$  denotes positive finding, '[ ]'$  denotes negative finding Cardiac  Comments:  Chest pain or chest pressure: x See HPI  Shortness of breath upon exertion:    Short of breath when lying flat:    Irregular heart rhythm:        Vascular    Pain in calf, thigh, or hip brought on by ambulation:    Pain in feet at night that wakes you up from your sleep:     Blood clot in your veins:    Leg swelling:         Pulmonary    Oxygen at home:    Productive cough:     Wheezing:         Neurologic    Sudden weakness in arms or legs:     Sudden numbness in arms or legs:     Sudden onset of difficulty speaking or slurred speech:    Temporary loss of vision in one eye:     Problems with dizziness:         Gastrointestinal    Blood in stool:     Vomited blood:         Genitourinary    Burning when  urinating:     Blood in urine:        Psychiatric    Major depression:         Hematologic    Bleeding problems:    Problems with blood clotting too easily:        Skin    Rashes or ulcers:        Constitutional    Fever or chills:      PHYSICAL EXAMINATION:  Today's Vitals   11/27/21 1302 11/27/21 1305  BP: (!) 147/76 131/79  Pulse: 80   Temp: 98.1 F (36.7 C)   TempSrc: Temporal   SpO2: 95%   Weight: 184 lb 14.4 oz (83.9 kg)   Height: '5\' 1"'$  (1.549 m)    Body mass index is 34.94 kg/m.   General:  WDWN in NAD; vital signs documented above Gait: Not observed HENT: WNL, normocephalic Pulmonary: normal non-labored breathing Cardiac: regular HR, without carotid bruits Abdomen: soft, NT; aortic pulse is not palpable Skin: without rashes Vascular Exam/Pulses:  Right Left  Radial 2+ (normal) 2+ (normal)  DP 2+ (normal) 2+ (normal)  PT 2+ (normal) 2+ (normal)   Extremities: without open wounds Musculoskeletal: no muscle wasting or atrophy  Neurologic: A&O X 3; moving all extremities equally; speech is fluent/normal Psychiatric:  The pt has Normal affect.   Non-Invasive Vascular Imaging:   Carotid Duplex on 11/27/2021 Right:  60-79% ICA stenosis Left:  1-39% ICA stenosis Vertebrals:  Right vertebral artery demonstrates antegrade flow. Left vertebral artery demonstrates retrograde flow.  Subclavians: Left subclavian artery was stenotic. Normal flow hemodynamics were seen in the right subclavian artery.  Previous Carotid duplex on 10/15/2020: Right: 60-79% ICA stenosis Left:   40-59% ICA stenosis Vertebrals:  Right vertebral artery demonstrates antegrade flow. Left vertebral artery demonstrates retrograde flow.  Subclavians: Left subclavian artery was stenotic. Normal flow hemodynamics were seen in the right subclavian artery   ASSESSMENT/PLAN:: 66 y.o. female here for follow up carotid artery stenosis   -duplex today reveals the right ICA remains in the 60-79%  range and the left has decreased to 1-39% stenosis.  She remains asymptomatic from this.  She has left subclavian artery stenosis and continues to have a palpable left radial pulse. -discussed s/s of stroke with pt and she understands should she develop any of these sx, she will go to the nearest ER or call 911. -pt will f/u in 6 months with carotid duplex -pt will call sooner should they have any issues. -continue statin/asa  -as far as her angina, I recommended her following up with her PCP for further investigation and referral.  She does have hx of covid 19 and may benefit from pulmonary evaluation.    Leontine Locket, [facility] [phone]  Clinic MD:  Donzetta Matters

## 2021-11-27 ENCOUNTER — Ambulatory Visit (HOSPITAL_COMMUNITY)
Admission: RE | Admit: 2021-11-27 | Discharge: 2021-11-27 | Disposition: A | Discharge status: Home / Self Care | Payer: Medicare Other | Source: Ambulatory Visit | Attending: Vascular Surgery | Admitting: Vascular Surgery

## 2021-11-27 ENCOUNTER — Ambulatory Visit (INDEPENDENT_AMBULATORY_CARE_PROVIDER_SITE_OTHER): Payer: Medicare Other | Admitting: Physician Assistant

## 2021-11-27 ENCOUNTER — Encounter: Payer: Self-pay | Admitting: Physician Assistant

## 2021-11-27 VITALS — BP 131/79 | HR 80 | Temp 98.1°F | Ht 61.0 in | Wt 184.9 lb

## 2021-11-27 DIAGNOSIS — I771 Stricture of artery: Secondary | ICD-10-CM | POA: Diagnosis not present

## 2021-11-27 DIAGNOSIS — I6523 Occlusion and stenosis of bilateral carotid arteries: Secondary | ICD-10-CM

## 2021-11-29 ENCOUNTER — Other Ambulatory Visit: Payer: Self-pay

## 2021-11-29 DIAGNOSIS — I6523 Occlusion and stenosis of bilateral carotid arteries: Secondary | ICD-10-CM

## 2021-12-01 ENCOUNTER — Other Ambulatory Visit: Payer: Self-pay | Admitting: Vascular Surgery

## 2021-12-09 ENCOUNTER — Encounter (HOSPITAL_COMMUNITY): Payer: Medicare Other

## 2021-12-09 ENCOUNTER — Ambulatory Visit: Payer: Medicare Other

## 2021-12-12 ENCOUNTER — Encounter (HOSPITAL_COMMUNITY): Payer: Medicare Other

## 2021-12-12 ENCOUNTER — Ambulatory Visit: Payer: Medicare Other

## 2022-01-10 ENCOUNTER — Other Ambulatory Visit: Payer: Self-pay | Admitting: Cardiology

## 2022-01-10 DIAGNOSIS — I10 Essential (primary) hypertension: Secondary | ICD-10-CM

## 2022-02-26 ENCOUNTER — Other Ambulatory Visit: Payer: Self-pay | Admitting: Cardiology

## 2022-02-26 DIAGNOSIS — I2510 Atherosclerotic heart disease of native coronary artery without angina pectoris: Secondary | ICD-10-CM

## 2022-03-06 ENCOUNTER — Other Ambulatory Visit: Payer: Self-pay | Admitting: Cardiology

## 2022-03-06 DIAGNOSIS — I10 Essential (primary) hypertension: Secondary | ICD-10-CM

## 2022-03-15 ENCOUNTER — Other Ambulatory Visit: Payer: Self-pay | Admitting: Gastroenterology

## 2022-03-25 ENCOUNTER — Telehealth: Payer: Self-pay

## 2022-03-25 NOTE — Telephone Encounter (Signed)
Pt called and asked could you do her a jury duty note for the month of January 10th -the end of February. Pt states that she cannot sit still that long without going back and forth tho use the bathroom. She states most of her stomach issues are in the mornings. Her work number to contact her is [phone] and the fax number to where she wants it faxed is [phone]. Please advise

## 2022-03-25 NOTE — Telephone Encounter (Signed)
I printed a letter.

## 2022-03-25 NOTE — Telephone Encounter (Signed)
Letter faxed to the pt with the confirmation coming back

## 2022-05-16 ENCOUNTER — Other Ambulatory Visit: Payer: Self-pay | Admitting: *Deleted

## 2022-05-16 DIAGNOSIS — I6523 Occlusion and stenosis of bilateral carotid arteries: Secondary | ICD-10-CM

## 2022-05-28 ENCOUNTER — Ambulatory Visit (INDEPENDENT_AMBULATORY_CARE_PROVIDER_SITE_OTHER): Payer: Medicare Other | Admitting: Physician Assistant

## 2022-05-28 ENCOUNTER — Ambulatory Visit (HOSPITAL_COMMUNITY)
Admission: RE | Admit: 2022-05-28 | Discharge: 2022-05-28 | Disposition: A | Discharge status: Home / Self Care | Payer: Medicare Other | Source: Ambulatory Visit | Attending: Vascular Surgery | Admitting: Vascular Surgery

## 2022-05-28 VITALS — BP 107/66 | HR 81 | Temp 98.2°F | Ht 61.0 in | Wt 186.0 lb

## 2022-05-28 DIAGNOSIS — I771 Stricture of artery: Secondary | ICD-10-CM | POA: Diagnosis not present

## 2022-05-28 DIAGNOSIS — I6523 Occlusion and stenosis of bilateral carotid arteries: Secondary | ICD-10-CM

## 2022-05-28 MED ORDER — ROSUVASTATIN CALCIUM 10 MG PO TABS: 10.0000 mg | ORAL_TABLET | Freq: Every day | ORAL | 6 refills | Status: DC

## 2022-05-28 NOTE — Progress Notes (Signed)
Office Note   History of Present Illness   Carmen Morgan is a 67 y.o. ([birth year]) female who presents for surveillance of carotid artery stenosis.  She also has a history of nonobstructive coronary artery atherosclerosis.  Previous carotid duplex studies have demonstrated bilateral subclavian artery stenosis.  She has been asymptomatic for this.  She has a history of chest pressure that radiates up to her jaw after walking.  She has seen cardiology and they have told her it is not due to her heart.  She has had a previous cardiac catheterization and was started on Imdur 30 mg for possible microvascular disease.  She has also been evaluated by a pulmonologist with no acute pulmonary findings.  She also has a history of carpal tunnel in both of her hands.  She returns today for follow-up.  She denies any stroke or TIA-like symptoms such as slurred speech, facial droop, sudden weakness/numbness in her arms or legs.  She denies any arm or leg claudication.  She endorses continued chest and jaw pressor after exercising for an extended period of time.  She is currently going back to GI for further evaluation.  She also states that she has been experiencing joint and muscle pain.  She stopped taking her Lipitor and her pains have gone away.  Current Outpatient Medications  Medication Sig Dispense Refill   acetaminophen (TYLENOL) 500 MG tablet Take 1,000 mg by mouth every 6 (six) hours as needed for moderate pain or headache.     ascorbic acid (VITAMIN C) 250 MG CHEW Chew 250 mg by mouth daily.     aspirin 81 MG EC tablet Take 1 tablet (81 mg total) by mouth daily with breakfast. Swallow whole. 30 tablet 11   Cholecalciferol (VITAMIN D3) 50 MCG (2000 UT) TABS Take 2,000 Units by mouth daily.      estradiol (ESTRACE) 0.5 MG tablet Take 0.5 mg by mouth at bedtime.      fexofenadine (ALLEGRA) 180 MG tablet Take 180 mg by mouth daily.     fluticasone (FLONASE) 50 MCG/ACT nasal spray Place 2 sprays  into both nostrils daily.      hydrochlorothiazide (HYDRODIURIL) 25 MG tablet TAKE 1 TABLET BY MOUTH EVERY DAY IN THE MORNING 90 tablet 3   isosorbide mononitrate (IMDUR) 30 MG 24 hr tablet TAKE 1 TABLET BY MOUTH EVERY DAY 90 tablet 2   lisinopril (ZESTRIL) 40 MG tablet TAKE 1 TABLET (40 MG TOTAL) BY MOUTH DAILY AT 10 PM. 90 tablet 1   Melatonin 10 MG CAPS Take 10 mg by mouth at bedtime.     pantoprazole (PROTONIX) 40 MG tablet Take 1 tablet (40 mg total) by mouth 2 (two) times daily. 180 tablet 3   Probiotic Product (PROBIOTIC DAILY PO) Take 1 tablet by mouth daily.     atorvastatin (LIPITOR) 20 MG tablet TAKE 1 TABLET BY MOUTH EVERY DAY (Patient not taking: Reported on 05/28/2022) 30 tablet 11   nitroGLYCERIN (NITROSTAT) 0.4 MG SL tablet Place 1 tablet (0.4 mg total) under the tongue every 5 (five) minutes as needed for chest pain. If you require more than two tablets five minutes apart go to the nearest ER via EMS. 30 tablet 0   triazolam (HALCION) 0.25 MG tablet Take 1 tablet (0.25 mg total) by mouth once for 1 dose. 30 minutes prior to MRI. 1 tablet 0   No current facility-administered medications for this visit.    REVIEW OF SYSTEMS (negative unless checked):   Cardiac:  [  x] Chest pain or chest pressure? '[]'$  Shortness of breath upon activity? '[]'$  Shortness of breath when lying flat? '[]'$  Irregular heart rhythm?  Vascular:  '[]'$  Pain in calf, thigh, or hip brought on by walking? '[]'$  Pain in feet at night that wakes you up from your sleep? '[]'$  Blood clot in your veins? '[]'$  Leg swelling?  Pulmonary:  '[]'$  Oxygen at home? '[]'$  Productive cough? '[]'$  Wheezing?  Neurologic:  '[]'$  Sudden weakness in arms or legs? '[]'$  Sudden numbness in arms or legs? '[]'$  Sudden onset of difficult speaking or slurred speech? '[]'$  Temporary loss of vision in one eye? '[]'$  Problems with dizziness?  Gastrointestinal:  '[]'$  Blood in stool? '[]'$  Vomited blood?  Genitourinary:  '[]'$  Burning when urinating? '[]'$  Blood in  urine?  Psychiatric:  '[]'$  Major depression  Hematologic:  '[]'$  Bleeding problems? '[]'$  Problems with blood clotting?  Dermatologic:  '[]'$  Rashes or ulcers?  Constitutional:  '[]'$  Fever or chills?  Ear/Nose/Throat:  '[]'$  Change in hearing? '[]'$  Nose bleeds? '[]'$  Sore throat?  Musculoskeletal:  '[]'$  Back pain? '[]'$  Joint pain? '[]'$  Muscle pain?   Physical Examination   Vitals:   05/28/22 1505 05/28/22 1509  BP: (!) 161/79 107/66  Pulse: 81   Temp: 98.2 F (36.8 C)   TempSrc: Temporal   SpO2: 98%   Weight: 186 lb (84.4 kg)   Height: '5\' 1"'$  (1.549 m)    Body mass index is 35.14 kg/m.  General:  WDWN in NAD; vital signs documented above Gait: Not observed HENT: WNL, normocephalic Pulmonary: normal non-labored breathing  Cardiac: Regular rate and rhythm Abdomen: soft, NT, no masses Skin: without rashes Vascular Exam/Pulses: 2+ radial, PT/DP pulses bilaterally Extremities: Without ischemic changes, edema, cellulitis, or open wounds Musculoskeletal: no muscle wasting or atrophy  Neurologic: A&O X 3;  No focal weakness or paresthesias are detected Psychiatric:  The pt has Normal affect.  Non-Invasive Vascular Imaging   B Carotid Duplex (05/28/2022):  R ICA stenosis:  40-59% R VA:  patent and antegrade L ICA stenosis:  40-59% L VA:  patent and retrograde   Medical Decision Making   Carmen Morgan is a 67 y.o. female who presents for surveillance of carotid artery stenosis and subclavian artery stenosis  Based on the patient's vascular studies, her right ICA stenosis currently measures at 40 to 59% with PSV of 191.  This has previously measured at 60-79% with PSV in the 300s.  Her left ICA stenosis is unchanged at 40 to 59%.  She denies any strokelike symptoms Duplex demonstrates continued subclavian artery stenosis bilaterally.  There are greatly increased velocities in the left subclavian artery with PSV of 736.  She has palpable radial pulses and denies any arm claudication. On  exam she has palpable and equal radial pulses bilaterally.  She also has palpable distal pulses. She states that she stopped taking her Lipitor about a month ago after experiencing persistent myalgias.  I will attempt to start her on a new statin for cholesterol control Since she is currently asymptomatic for subclavian stenosis with elevated velocities today, we will continue to monitor this.  She can follow-up with our office in 1 year with repeat carotid artery duplex study.  I will also send out a prescription for Crestor 10 mg.  If she tolerates this, she can continue this long-term   Vicente Serene PA-C [facility] [place] Office: [facility] MD: Donzetta Matters

## 2022-06-24 ENCOUNTER — Ambulatory Visit (INDEPENDENT_AMBULATORY_CARE_PROVIDER_SITE_OTHER): Payer: Medicare Other | Admitting: Internal Medicine

## 2022-06-24 VITALS — BP 153/72 | HR 89 | Temp 98.4°F | Ht 61.0 in | Wt 188.6 lb

## 2022-06-24 DIAGNOSIS — R1011 Right upper quadrant pain: Secondary | ICD-10-CM

## 2022-06-24 NOTE — Patient Instructions (Signed)
It was good seeing you again today!  I am glad your cardiac cath revealed no significant coronary disease.  For now:  Continue Protonix 40 mg twice daily.  As discussed, we will check a gallbladder ultrasound and a hepatic function profile.  You may well benefit from enrolling in cardiac rehab program as you are likely chronically deconditioned with a recent heart attack and COVID making things worse.  Office visit with [place] in 3 months  Further recommendations to follow.

## 2022-06-24 NOTE — Progress Notes (Unsigned)
Primary Care Physician:  Daryel Gerald, FNP Primary Gastroenterologist:  Dr. Harrietta Guardian  Pre-Procedure History & Physical: HPI:  Carmen Morgan is a 67 y.o. female here for further evaluation of duplication cyst dyspnea on exertion with upper abdominal bloating.  Patient has history of peripheral arterial disease, subclavian stenosis, carotid artery stenosis, meningioma, recent MI and COVID sent from physicians in [place] for further evaluation.  She has a history of GERD well-controlled on twice daily PPI therapy.  Novgos was empirically dilated for dysphagia a few years ago.  Her esophagus appeared normal.  This was associated with resolution of her dysphagia symptoms.  She intermittently has upper abdominal bloating alternating constipation and diarrhea.  Celiac markers negative previously through this office.  She does not have postprandial abdominal pain but sometimes gets postprandial "bloating".  She is not afraid to eat.  Her weight has been stable in the 180 pound range over the past few years.  Pulmonary evaluation recently reveals some degree of bronchiectasis and COPD cardiac catheterization last year revealed nonobstructive coronary disease.  She does not have a stent.  She was treated with Imdur for microvascular disease.  She clearly describes getting short of breath walking less than about a block or so she gets chest tightness goes up into her jaw on her abdominal abdomen bloats.  She sits down and goes away in 10 to 15 minutes.  She has nitroglycerin on hand but has never taken it.    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  Smoke for years none in 13 years.  Gallbladder remains in situ.  She states she has never had a gallbladder ultrasound.  He has recently seen her primary care physician, cardiologist, neurologist vascular surgeon for her exertional symptoms.  No specific diagnosis made.  Past Medical History:   Diagnosis Date   Coronary atherosclerosis due to calcified coronary lesion    GERD (gastroesophageal reflux disease)    History of COVID-04 April 2018 and July 2022.   Hyperlipidemia    Hypertension    Nonobstructive atherosclerosis of coronary artery    Seasonal allergies     Past Surgical History:  Procedure Laterality Date   ABDOMINAL HYSTERECTOMY  2014   BACK SURGERY  2012   COLONOSCOPY N/A 02/16/2019   pancolonic diverticulosis   ESOPHAGOGASTRODUODENOSCOPY N/A 01/18/2020   Procedure: ESOPHAGOGASTRODUODENOSCOPY (EGD);  Surgeon: Corbin Ade, MD;  Location: [facility];  Service: Endoscopy;  Laterality: N/A;  2:45pm   left breast cystectomy  1997   LEFT HEART CATH AND CORONARY ANGIOGRAPHY N/A 05/14/2021   Procedure: LEFT HEART CATH AND CORONARY ANGIOGRAPHY;  Surgeon: Elder Negus, MD;  Location: [facility];  Service: Cardiovascular;  Laterality: N/A;   MALONEY DILATION N/A 01/18/2020   Procedure: Elease Hashimoto DILATION;  Surgeon: Corbin Ade, MD;  Location: [facility];  Service: Endoscopy;  Laterality: N/A;   URETHRAL SLING  07/2020   VEIN SURGERY  2013    Prior to Admission medications   Medication Sig Start Date End Date Taking? Authorizing Provider  acetaminophen (TYLENOL) 500 MG tablet Take 1,000 mg by mouth every 6 (six) hours as needed for moderate pain or headache.   Yes [provider]  ascorbic acid (VITAMIN C) 250 MG CHEW Chew 250 mg by mouth daily.   Yes [provider]  aspirin 81 MG EC tablet Take 1 tablet (81 mg total) by mouth daily with breakfast.  Swallow whole. 10/17/20  Yes Regalado, Belkys A, MD  Cholecalciferol (VITAMIN D3) 50 MCG (2000 UT) TABS Take 2,000 Units by mouth daily.    Yes [provider]  estradiol (ESTRACE) 0.5 MG tablet Take 0.5 mg by mouth at bedtime.    Yes [provider]  fexofenadine (ALLEGRA) 180 MG tablet Take 180 mg by mouth daily.   Yes [provider]   fluticasone (FLONASE) 50 MCG/ACT nasal spray Place 2 sprays into both nostrils daily.    Yes [provider]  hydrochlorothiazide (HYDRODIURIL) 25 MG tablet TAKE 1 TABLET BY MOUTH EVERY DAY IN THE MORNING 01/10/22  Yes Tolia, Sunit, DO  isosorbide mononitrate (IMDUR) 30 MG 24 hr tablet TAKE 1 TABLET BY MOUTH EVERY DAY 02/26/22  Yes Tolia, Sunit, DO  lisinopril (ZESTRIL) 40 MG tablet TAKE 1 TABLET (40 MG TOTAL) BY MOUTH DAILY AT 10 PM. 03/06/22 09/02/22 Yes Tolia, Sunit, DO  Melatonin 10 MG CAPS Take 10 mg by mouth at bedtime.   Yes [provider]  nitroGLYCERIN (NITROSTAT) 0.4 MG SL tablet Place 1 tablet (0.4 mg total) under the tongue every 5 (five) minutes as needed for chest pain. If you require more than two tablets five minutes apart go to the nearest ER via EMS. 05/03/21 06/24/22 Yes Tolia, Sunit, DO  pantoprazole (PROTONIX) 40 MG tablet Take 1 tablet (40 mg total) by mouth 2 (two) times daily. 08/08/21  Yes Gelene MinkBoone, Anna W, NP  Probiotic Product (PROBIOTIC DAILY PO) Take 1 tablet by mouth daily.   Yes [provider]  rosuvastatin (CRESTOR) 10 MG tablet Take 1 tablet (10 mg total) by mouth daily. 05/28/22  Yes Schuh, McKenzi P, PA-C  triazolam (HALCION) 0.25 MG tablet Take 1 tablet (0.25 mg total) by mouth once for 1 dose. 30 minutes prior to MRI. 10/08/21 06/24/22 Yes Gelene MinkBoone, Anna W, NP    Allergies as of 06/24/2022 - Review Complete 06/24/2022  Allergen Reaction Noted   Penicillins Hives 10/29/2018   Vitamin b12 Nausea And Vomiting 10/29/2018   Vicks nyquil cough [doxylamine-dm] Palpitations 05/08/2021    Family History  Problem Relation Age of Onset   Diabetes Mother    Heart attack Mother    CAD Mother    Arthritis Mother    Cancer Father    Hypertension Father    COPD Sister    CAD Sister    Diabetes Sister    Testicular cancer Brother    Colon cancer Neg Hx     Social History   Socioeconomic History   Marital status: Married    Spouse name: Not on  file   Number of children: 1   Years of education: Not on file   Highest education level: Not on file  Occupational History   Not on file  Tobacco Use   Smoking status: Former    Packs/day: 0.50    Years: 40.00    Additional pack years: 0.00    Total pack years: 20.00    Types: Cigarettes    Quit date: 04/25/2009    Years since quitting: 13.1   Smokeless tobacco: Never  Vaping Use   Vaping Use: Former  Substance and Sexual Activity   Alcohol use: Never   Drug use: Never   Sexual activity: Yes  Other Topics Concern   Not on file  Social History Narrative   Not on file   Social Determinants of Health   Financial Resource Strain: Not on file  Food Insecurity: Not on file  Transportation Needs: Not on file  Physical Activity: Not on file  Stress: Not on file  Social Connections: Not on file  Intimate Partner Violence: Not on file    Review of Systems: See HPI, otherwise negative ROS  Physical Exam: BP (!) 155/69 (BP Location: Right Arm, Patient Position: Sitting, Cuff Size: Normal)   Pulse 89   Temp 98.4 F (36.9 C) (Temporal)   Ht 5\' 1"  (1.549 m)   Wt 188 lb 9.6 oz (85.5 kg)   SpO2 94%   BMI 35.64 kg/m  General:   Alert,  Well-developed, well-nourished, pleasant and cooperative in NAD Neck:  Supple; no masses or thyromegaly. No significant cervical adenopathy. Lungs:  Clear throughout to auscultation.   No wheezes, crackles, or rhonchi. No acute distress. Heart:  Regular rate and rhythm; no murmurs, clicks, rubs,  or gallops. Abdomen: Non-distended, normal bowel sounds.  Soft and nontender without appreciable mass or hepatosplenomegaly.    Impression/Plan: 67 year old lady with COPD/bronchiectasis nonobstructive coronary artery disease with non-STEMI last year, prolonged COVID syndrome referred for further evaluation of exertional upper abdominal bloating and dyspnea.  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  GERD well-controlled on twice daily PPI.  No dysphagia after undergoing empiric passage of a Maloney dilator a couple of years ago.  Dysphagia has resolved.  Her esophagus appeared normal at that time. Intermittent abdominal bloating and upper abdominal discomfort nonspecific.  Need to consider the possibility of occult gallbladder disease. 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  I believe I was sent the CD of her CAT scan.  Will plan to review it in the near future.  Exertional symptoms are reminiscent of cardiac ischemia,however, her cardiac workup is up-to-date and that has been ruled out per her cardiologist.  I have to be concerned about chronic deconditioning with recent MI and prolonged COVID syndrome as contributing to her exertional symptoms.  I do not implicate any GI pathology with her exertional symptoms.  Recommendations:  Continue Protonix 40 mg twice daily.  As discussed, we will check a gallbladder ultrasound and a hepatic function profile.  You may well benefit from enrolling in cardiac rehab program as you are likely chronically deconditioned with a recent heart attack and COVID making things worse.  Office visit with [place] in 3 months  Further recommendations to follow.   Notice: This dictation was prepared with Dragon dictation along with smaller phrase technology. Any transcriptional errors that result from this process are unintentional and may not be corrected upon review.

## 2022-06-25 ENCOUNTER — Telehealth: Payer: Self-pay | Admitting: *Deleted

## 2022-06-25 NOTE — Telephone Encounter (Signed)
Pt informed us is scheduled for 07/25/22, arrive at 9:15 am, nothing to eat or drink after midnight.

## 2022-07-15 LAB — HEPATIC FUNCTION PANEL
ALT: 16 IU/L (ref 0–32)
AST: 21 IU/L (ref 0–40)
Albumin: 3.9 g/dL (ref 3.9–4.9)
Alkaline Phosphatase: 101 IU/L (ref 44–121)
Bilirubin Total: 0.3 mg/dL (ref 0.0–1.2)
Bilirubin, Direct: 0.12 mg/dL (ref 0.00–0.40)
Total Protein: 6.3 g/dL (ref 6.0–8.5)

## 2022-07-22 ENCOUNTER — Other Ambulatory Visit: Payer: Self-pay | Admitting: Neurosurgery

## 2022-07-22 DIAGNOSIS — D329 Benign neoplasm of meninges, unspecified: Secondary | ICD-10-CM

## 2022-07-25 ENCOUNTER — Ambulatory Visit (HOSPITAL_COMMUNITY)
Admission: RE | Admit: 2022-07-25 | Discharge: 2022-07-25 | Disposition: A | Discharge status: Home / Self Care | Payer: Medicare Other | Source: Ambulatory Visit | Attending: Internal Medicine | Admitting: Internal Medicine

## 2022-07-25 DIAGNOSIS — R1011 Right upper quadrant pain: Secondary | ICD-10-CM | POA: Insufficient documentation

## 2022-07-30 ENCOUNTER — Other Ambulatory Visit: Payer: Self-pay | Admitting: Gastroenterology

## 2022-08-23 ENCOUNTER — Ambulatory Visit
Admission: RE | Admit: 2022-08-23 | Discharge: 2022-08-23 | Disposition: A | Discharge status: Home / Self Care | Payer: Medicare Other | Source: Ambulatory Visit | Attending: Neurosurgery | Admitting: Neurosurgery

## 2022-08-23 DIAGNOSIS — D329 Benign neoplasm of meninges, unspecified: Secondary | ICD-10-CM

## 2022-08-23 MED ORDER — GADOPICLENOL 0.5 MMOL/ML IV SOLN
7.5000 mL | Freq: Once | INTRAVENOUS | Status: AC | PRN
  Administered 2022-08-23: 7.5 mL via INTRAVENOUS

## 2022-08-28 ENCOUNTER — Ambulatory Visit: Payer: Medicare Other | Admitting: Cardiology

## 2022-08-29 ENCOUNTER — Ambulatory Visit: Payer: Medicare Other | Admitting: Cardiology

## 2022-09-01 ENCOUNTER — Encounter: Payer: Self-pay | Admitting: Neurosurgery

## 2022-09-03 ENCOUNTER — Other Ambulatory Visit: Payer: Self-pay | Admitting: Cardiology

## 2022-09-03 DIAGNOSIS — I2510 Atherosclerotic heart disease of native coronary artery without angina pectoris: Secondary | ICD-10-CM

## 2022-09-06 ENCOUNTER — Other Ambulatory Visit: Payer: Self-pay | Admitting: Cardiology

## 2022-09-06 DIAGNOSIS — I10 Essential (primary) hypertension: Secondary | ICD-10-CM

## 2022-10-03 ENCOUNTER — Encounter: Payer: Self-pay | Admitting: Cardiology

## 2022-10-03 ENCOUNTER — Other Ambulatory Visit: Payer: Self-pay | Admitting: Cardiology

## 2022-10-03 ENCOUNTER — Ambulatory Visit: Payer: Medicare Other | Admitting: Cardiology

## 2022-10-03 VITALS — BP 138/75 | HR 85 | Resp 16 | Ht 61.0 in | Wt 184.2 lb

## 2022-10-03 DIAGNOSIS — I2089 Other forms of angina pectoris: Secondary | ICD-10-CM

## 2022-10-03 DIAGNOSIS — I252 Old myocardial infarction: Secondary | ICD-10-CM

## 2022-10-03 DIAGNOSIS — I2510 Atherosclerotic heart disease of native coronary artery without angina pectoris: Secondary | ICD-10-CM

## 2022-10-03 DIAGNOSIS — E6609 Other obesity due to excess calories: Secondary | ICD-10-CM

## 2022-10-03 DIAGNOSIS — I771 Stricture of artery: Secondary | ICD-10-CM

## 2022-10-03 DIAGNOSIS — I10 Essential (primary) hypertension: Secondary | ICD-10-CM

## 2022-10-03 DIAGNOSIS — I6523 Occlusion and stenosis of bilateral carotid arteries: Secondary | ICD-10-CM

## 2022-10-03 DIAGNOSIS — I700 Atherosclerosis of aorta: Secondary | ICD-10-CM

## 2022-10-03 MED ORDER — AMLODIPINE BESYLATE 5 MG PO TABS: 5.0000 mg | ORAL_TABLET | Freq: Every day | ORAL | 0 refills | Status: DC

## 2022-10-03 MED ORDER — WEGOVY 0.25 MG/0.5ML ~~LOC~~ SOAJ: 0.2500 mg | SUBCUTANEOUS | 0 refills | Status: DC

## 2022-10-03 NOTE — Progress Notes (Signed)
ID:  DERRICK ORRIS, DOB Mar 31, 1955, MRN [id number]  PCP:  Daryel Gerald, FNP  Cardiologist:  Tessa Lerner, DO, [facility] (established care 10/14/2020)  Date: 10/03/22 Last Office Visit: 08/23/2021  Chief Complaint  Patient presents with   Follow-up    1 year follow up - mild nonobstructive CAD.     HPI  Carmen Morgan is a 67 y.o. female whose past medical history and cardiovascular risk factors include: Mild nonobstructive CAD, mild coronary artery calcification, history of COVID-19 infection (January 2020 and July 2022), hypertension, on hormone replacement therapy, former smoker, postmenopausal female, advanced age, obesity due to excess calories.  Presented to the ED in July 2022 and ruled in for non-STEMI.  She underwent coronary CTA which noted mild CAC and mild nonobstructive CAD.  She was started on guideline directed medical therapy.  On subsequent follow-up visits patient continues to have precordial discomfort and dyspnea on exertion and was concerned that something was missed on the coronary CTA and requested to undergo left heart catheterization.  She was found to have no significant epicardial coronary disease and thereafter was started on Imdur for microvascular disease/dysfunction.  Since that has done well and now comes for 1 year follow up visit.   Overall doing well but on average has a short episode of chest pain that she describes has angina (left sided pain, not brought on my effort, not relieved by rest, usually self limited). She is now seeing GI and has seen pulmonary medicine. She denies heart failure symptoms.   FUNCTIONAL STATUS: No structured exercise program or daily routine.   ALLERGIES: Allergies  Allergen Reactions   Penicillins Hives    Did it involve swelling of the face/tongue/throat, SOB, or low BP? No Did it involve sudden or severe rash/hives, skin peeling, or any reaction on the inside of your mouth or nose? No Did you need to seek medical attention  at a hospital or doctor's office? No When did it last happen?      childhood allergy  If all above answers are "NO", may proceed with cephalosporin use.    Vitamin B12 Nausea And Vomiting   Vicks Nyquil Cough [Doxylamine-Dm] Palpitations    Irregular Heart Rate    MEDICATION LIST PRIOR TO VISIT: Current Meds  Medication Sig   acetaminophen (TYLENOL) 500 MG tablet Take 1,000 mg by mouth every 6 (six) hours as needed for moderate pain or headache.   amLODipine (NORVASC) 5 MG tablet Take 1 tablet (5 mg total) by mouth daily.   ascorbic acid (VITAMIN C) 250 MG CHEW Chew 250 mg by mouth daily.   aspirin 81 MG EC tablet Take 1 tablet (81 mg total) by mouth daily with breakfast. Swallow whole.   Cholecalciferol (VITAMIN D3) 50 MCG (2000 UT) TABS Take 2,000 Units by mouth daily.    estradiol (ESTRACE) 0.5 MG tablet Take 0.5 mg by mouth at bedtime.    fexofenadine (ALLEGRA) 180 MG tablet Take 180 mg by mouth daily.   fluticasone (FLONASE) 50 MCG/ACT nasal spray Place 2 sprays into both nostrils daily.    hydrochlorothiazide (HYDRODIURIL) 25 MG tablet TAKE 1 TABLET BY MOUTH EVERY DAY IN THE MORNING   isosorbide mononitrate (IMDUR) 30 MG 24 hr tablet TAKE 1 TABLET BY MOUTH EVERY DAY   lisinopril (ZESTRIL) 40 MG tablet TAKE 1 TABLET (40 MG TOTAL) BY MOUTH DAILY AT 10 PM.   Melatonin 10 MG CAPS Take 10 mg by mouth at bedtime.   nitroGLYCERIN (NITROSTAT) 0.4 MG  SL tablet Place 1 tablet (0.4 mg total) under the tongue every 5 (five) minutes as needed for chest pain. If you require more than two tablets five minutes apart go to the nearest ER via EMS.   pantoprazole (PROTONIX) 40 MG tablet TAKE 1 TABLET BY MOUTH TWICE A DAY   Probiotic Product (PROBIOTIC DAILY PO) Take 1 tablet by mouth daily.   rosuvastatin (CRESTOR) 10 MG tablet Take 1 tablet (10 mg total) by mouth daily.   Semaglutide-Weight Management (WEGOVY) 0.25 MG/0.5ML SOAJ Inject 0.25 mg into the skin once a week for 4 doses.     PAST  MEDICAL HISTORY: Past Medical History:  Diagnosis Date   Coronary atherosclerosis due to calcified coronary lesion    GERD (gastroesophageal reflux disease)    History of COVID-04 April 2018 and July 2022.   Hyperlipidemia    Hypertension    Nonobstructive atherosclerosis of coronary artery    Seasonal allergies     PAST SURGICAL HISTORY: Past Surgical History:  Procedure Laterality Date   ABDOMINAL HYSTERECTOMY  2014   BACK SURGERY  2012   COLONOSCOPY N/A 02/16/2019   pancolonic diverticulosis   ESOPHAGOGASTRODUODENOSCOPY N/A 01/18/2020   Procedure: ESOPHAGOGASTRODUODENOSCOPY (EGD);  Surgeon: Corbin Ade, MD;  Location: [facility];  Service: Endoscopy;  Laterality: N/A;  2:45pm   left breast cystectomy  1997   LEFT HEART CATH AND CORONARY ANGIOGRAPHY N/A 05/14/2021   Procedure: LEFT HEART CATH AND CORONARY ANGIOGRAPHY;  Surgeon: Elder Negus, MD;  Location: [facility];  Service: Cardiovascular;  Laterality: N/A;   MALONEY DILATION N/A 01/18/2020   Procedure: Elease Hashimoto DILATION;  Surgeon: Corbin Ade, MD;  Location: [facility];  Service: Endoscopy;  Laterality: N/A;   URETHRAL SLING  07/2020   VEIN SURGERY  2013    FAMILY HISTORY: The patient family history includes Arthritis in her mother; CAD in her mother and sister; COPD in her sister; Cancer in her father; Diabetes in her mother and sister; Heart attack in her mother; Hypertension in her father; Testicular cancer in her brother.  SOCIAL HISTORY:  The patient  reports that she quit smoking about 13 years ago. Her smoking use included cigarettes. She started smoking about 53 years ago. She has a 20 pack-year smoking history. She has never used smokeless tobacco. She reports that she does not drink alcohol and does not use drugs.  REVIEW OF SYSTEMS: Review of Systems  Cardiovascular:  Positive for chest pain (predominantly non-cardiac). Negative for dyspnea on exertion, leg swelling,  near-syncope, orthopnea, palpitations, paroxysmal nocturnal dyspnea and syncope.  Respiratory:  Negative for shortness of breath.     PHYSICAL EXAM:    10/03/2022   11:34 AM 06/24/2022    4:27 PM 06/24/2022    3:22 PM  Vitals with BMI  Height 5\' 1"   5\' 1"   Weight 184 lbs 3 oz  188 lbs 10 oz  BMI 34.82  35.65  Systolic 138 153 657  Diastolic 75 72 69  Pulse 85  89   Physical Exam  Constitutional: No distress.  Age appropriate, hemodynamically stable.   Neck: No JVD present.  Cardiovascular: Normal rate, regular rhythm, S1 normal and S2 normal. Exam reveals no gallop, no S3 and no S4.  No murmur heard. Pulses:      Carotid pulses are  on the right side with bruit and  on the left side with bruit. Pulmonary/Chest: Effort normal and breath sounds normal. No stridor. She  has no wheezes. She has no rales.  Abdominal: Soft. Bowel sounds are normal. She exhibits no distension. There is no abdominal tenderness.  Musculoskeletal:        General: No edema.     Cervical back: Neck supple.  Neurological: She is alert and oriented to person, place, and time. She has intact cranial nerves (2-12).  Skin: Skin is warm and moist.   CARDIAC DATABASE: EKG: 05/03/2021: NSR, 85 bpm, left bundle branch block. 10/03/2022: NSR, 78bpm, LBBB.   Echocardiogram: 10/14/2020: LVEF 65-70%, no regional wall motion abnormalities, mild LVH, trivial MR.  Stress test: None  Heart catheterization: 05/14/2021: LM: Normal LAD: No significant disease Lcx: Mid 20% disease RCA: Prox 20% disease   LVEDP 26 mmHg No obstructive CAD  Recommend Imdur 30 mg for possible microvascular disease  Carotid Duplex 10/15/2020:  Right Carotid: Velocities in the right ICA are consistent with a 60-79% stenosis. The ECA appears >50% stenosed.  Left Carotid: Velocities in the left ICA are consistent with a 40-59%  stenosis. Non-hemodynamically significant plaque <50% noted in the CCA.  Vertebrals:  Right vertebral artery  demonstrates antegrade flow. Left vertebral artery demonstrates retrograde flow.  Subclavians: Left subclavian artery was stenotic. Normal flow hemodynamics  were seen in the right subclavian artery.    Coronary CTA 10/15/2020: 1. Total coronary calcium score of 14.1. This was 60th percentile for age and sex matched control. 2. Normal coronary origin with right dominance. 3. CAD-RADS = 2. Minimal stenosis (0-24%) due to noncalcified plaque in the proximal LAD. Mild stenosis (25-49%) within the mid LCx due to mixed plaque. Minimal stenosis (0-24%) due to mixed plaque in proximal RCA. 4. Aortic atherosclerosis within descending aorta.  LABORATORY DATA:    Latest Ref Rng & Units 05/07/2021    9:41 AM 10/16/2020    3:48 AM 10/15/2020    2:25 AM  CBC  WBC 3.4 - 10.8 x10E3/uL 7.0  7.5  7.2   Hemoglobin 11.1 - 15.9 g/dL 36.6  44.0  34.7   Hematocrit 34.0 - 46.6 % 38.6  33.9  33.9   Platelets 150 - 450 x10E3/uL 227  242  242        Latest Ref Rng & Units 07/14/2022    8:36 AM 09/19/2021    1:52 PM 05/30/2021    9:39 AM  CMP  Glucose 70 - 99 mg/dL   425   BUN 8 - 27 mg/dL   18   Creatinine 9.56 - 1.00 mg/dL  3.87  5.64   Sodium 332 - 144 mmol/L   139   Potassium 3.5 - 5.2 mmol/L   4.2   Chloride 96 - 106 mmol/L   102   CO2 20 - 29 mmol/L   25   Calcium 8.7 - 10.3 mg/dL   95.1   Total Protein 6.0 - 8.5 g/dL 6.3     Total Bilirubin 0.0 - 1.2 mg/dL 0.3     Alkaline Phos 44 - 121 IU/L 101     AST 0 - 40 IU/L 21     ALT 0 - 32 IU/L 16       Lipid Panel     Component Value Date/Time   CHOL 140 05/07/2021 0941   TRIG 103 05/07/2021 0941   HDL 52 05/07/2021 0941   CHOLHDL 3.2 10/15/2020 0225   VLDL 36 10/15/2020 0225   LDLCALC 69 05/07/2021 0941   LDLDIRECT 67 05/07/2021 0941   LABVLDL 19 05/07/2021 0941    No components  found for: "NTPROBNP" No results for input(s): "PROBNP" in the last 8760 hours. No results for input(s): "TSH" in the last 8760 hours.  BMP No results for  input(s): "NA", "K", "CL", "CO2", "GLUCOSE", "BUN", "CREATININE", "CALCIUM", "GFRNONAA", "GFRAA" in the last 8760 hours.   HEMOGLOBIN A1C Lab Results  Component Value Date   HGBA1C 6.2 (H) 10/15/2020   MPG 131.24 10/15/2020    IMPRESSION:    ICD-10-CM   1. Nonobstructive atherosclerosis of coronary artery  I25.10 amLODipine (NORVASC) 5 MG tablet    Semaglutide-Weight Management (WEGOVY) 0.25 MG/0.5ML SOAJ    2. Coronary atherosclerosis due to calcified coronary lesion  I25.10 Semaglutide-Weight Management (WEGOVY) 0.25 MG/0.5ML SOAJ   I25.84     3. Microvascular angina  I20.89 amLODipine (NORVASC) 5 MG tablet    Semaglutide-Weight Management (WEGOVY) 0.25 MG/0.5ML SOAJ    4. Bilateral carotid artery stenosis  I65.23     5. Hx of non-ST elevation myocardial infarction (NSTEMI)  I25.2     6. Benign hypertension  I10     7. Aortic atherosclerosis (HCC)  I70.0     8. Stenosis of left subclavian artery (HCC)  I77.1     9. Class 1 obesity due to excess calories with serious comorbidity and body mass index (BMI) of 34.0 to 34.9 in adult  E66.09 Semaglutide-Weight Management (WEGOVY) 0.25 MG/0.5ML SOAJ   Z68.34          RECOMMENDATIONS: Carmen Morgan is a 67 y.o. female whose past medical history and cardiac risk factors include: Mild nonobstructive CAD, mild coronary artery calcification, history of COVID-19 infection (January 2020 and July 2022), hypertension, on hormone replacement therapy, former smoker, postmenopausal female, advanced age, obesity due to excess calories.  Nonobstructive atherosclerosis of coronary artery Mild CAC/history of NSTEMI  Chronic and stable. Echo: LVEF 65 to 70%, no significant valvular heart disease. Coronary CTA: Mild CAC (total CAC 14.1 AU, 60th percentile) mild nonobstructive CAD. LHC: no significant epicardial coronary disease. Has done well w/ Imudr.  Continues to have some non-cardiac pain; not classic angina but she uses "anginal pain"  to describe her symptoms.  Will start low dose Norvasc given her known hx of microvascular angina.  May consider Ranexa at the next office visit.  No use of nitroglycerin since last office visit.   Benign hypertension Improved. No changes to medical therapy needed at this time.   Bilateral carotid artery stenosis / Stenosis of left subclavian artery (HCC) Currently on aspirin and statin therapy. Follows up with vascular surgery in August 2023.  Given her CAD and underlying obesity shared decision was to start [facility]. Medication profile discussed. Once approved she will come in for nurse visit to be taught how to administer the medications.  FINAL MEDICATION LIST END OF ENCOUNTER: Meds ordered this encounter  Medications   amLODipine (NORVASC) 5 MG tablet    Sig: Take 1 tablet (5 mg total) by mouth daily.    Dispense:  30 tablet    Refill:  0   Semaglutide-Weight Management (WEGOVY) 0.25 MG/0.5ML SOAJ    Sig: Inject 0.25 mg into the skin once a week for 4 doses.    Dispense:  2 mL    Refill:  0    There are no discontinued medications.     Current Outpatient Medications:    acetaminophen (TYLENOL) 500 MG tablet, Take 1,000 mg by mouth every 6 (six) hours as needed for moderate pain or headache., Disp: , Rfl:    amLODipine (NORVASC) 5 MG  tablet, Take 1 tablet (5 mg total) by mouth daily., Disp: 30 tablet, Rfl: 0   ascorbic acid (VITAMIN C) 250 MG CHEW, Chew 250 mg by mouth daily., Disp: , Rfl:    aspirin 81 MG EC tablet, Take 1 tablet (81 mg total) by mouth daily with breakfast. Swallow whole., Disp: 30 tablet, Rfl: 11   Cholecalciferol (VITAMIN D3) 50 MCG (2000 UT) TABS, Take 2,000 Units by mouth daily. , Disp: , Rfl:    estradiol (ESTRACE) 0.5 MG tablet, Take 0.5 mg by mouth at bedtime. , Disp: , Rfl:    fexofenadine (ALLEGRA) 180 MG tablet, Take 180 mg by mouth daily., Disp: , Rfl:    fluticasone (FLONASE) 50 MCG/ACT nasal spray, Place 2 sprays into both nostrils daily. ,  Disp: , Rfl:    hydrochlorothiazide (HYDRODIURIL) 25 MG tablet, TAKE 1 TABLET BY MOUTH EVERY DAY IN THE MORNING, Disp: 90 tablet, Rfl: 3   isosorbide mononitrate (IMDUR) 30 MG 24 hr tablet, TAKE 1 TABLET BY MOUTH EVERY DAY, Disp: 90 tablet, Rfl: 2   lisinopril (ZESTRIL) 40 MG tablet, TAKE 1 TABLET (40 MG TOTAL) BY MOUTH DAILY AT 10 PM., Disp: 90 tablet, Rfl: 1   Melatonin 10 MG CAPS, Take 10 mg by mouth at bedtime., Disp: , Rfl:    nitroGLYCERIN (NITROSTAT) 0.4 MG SL tablet, Place 1 tablet (0.4 mg total) under the tongue every 5 (five) minutes as needed for chest pain. If you require more than two tablets five minutes apart go to the nearest ER via EMS., Disp: 30 tablet, Rfl: 0   pantoprazole (PROTONIX) 40 MG tablet, TAKE 1 TABLET BY MOUTH TWICE A DAY, Disp: 180 tablet, Rfl: 1   Probiotic Product (PROBIOTIC DAILY PO), Take 1 tablet by mouth daily., Disp: , Rfl:    rosuvastatin (CRESTOR) 10 MG tablet, Take 1 tablet (10 mg total) by mouth daily., Disp: 30 tablet, Rfl: 6   Semaglutide-Weight Management (WEGOVY) 0.25 MG/0.5ML SOAJ, Inject 0.25 mg into the skin once a week for 4 doses., Disp: 2 mL, Rfl: 0   triazolam (HALCION) 0.25 MG tablet, Take 1 tablet (0.25 mg total) by mouth once for 1 dose. 30 minutes prior to MRI., Disp: 1 tablet, Rfl: 0  No orders of the defined types were placed in this encounter.   There are no Patient Instructions on file for this visit.   --Continue cardiac medications as reconciled in final medication list. --Return in about 3 months (around 01/03/2023) for Follow up microvascular angina . Or sooner if needed. --Continue follow-up with your primary care physician regarding the management of your other chronic comorbid conditions.  Patient's questions and concerns were addressed to her satisfaction. She voices understanding of the instructions provided during this encounter.   This note was created using a voice recognition software as a result there may be grammatical  errors inadvertently enclosed that do not reflect the nature of this encounter. Every attempt is made to correct such errors.  Tessa Lerner, [place], [facility]  Pager:  [phone] Office: [phone]

## 2022-10-07 ENCOUNTER — Other Ambulatory Visit: Payer: Self-pay | Admitting: Cardiology

## 2022-10-07 DIAGNOSIS — I2089 Other forms of angina pectoris: Secondary | ICD-10-CM

## 2022-10-07 DIAGNOSIS — E6609 Other obesity due to excess calories: Secondary | ICD-10-CM

## 2022-10-07 DIAGNOSIS — I2510 Atherosclerotic heart disease of native coronary artery without angina pectoris: Secondary | ICD-10-CM

## 2022-10-08 ENCOUNTER — Encounter: Payer: Self-pay | Admitting: Cardiology

## 2022-10-08 ENCOUNTER — Other Ambulatory Visit: Payer: Self-pay | Admitting: Cardiology

## 2022-10-08 DIAGNOSIS — E6609 Other obesity due to excess calories: Secondary | ICD-10-CM

## 2022-10-08 DIAGNOSIS — I2089 Other forms of angina pectoris: Secondary | ICD-10-CM

## 2022-10-08 DIAGNOSIS — I2510 Atherosclerotic heart disease of native coronary artery without angina pectoris: Secondary | ICD-10-CM

## 2022-10-10 ENCOUNTER — Encounter: Payer: Self-pay | Admitting: Internal Medicine

## 2022-10-10 ENCOUNTER — Other Ambulatory Visit: Payer: Self-pay | Admitting: *Deleted

## 2022-10-10 ENCOUNTER — Ambulatory Visit (INDEPENDENT_AMBULATORY_CARE_PROVIDER_SITE_OTHER): Payer: Medicare Other | Admitting: Internal Medicine

## 2022-10-10 VITALS — BP 147/70 | HR 85 | Temp 97.6°F | Ht 61.0 in | Wt 184.6 lb

## 2022-10-10 DIAGNOSIS — R1011 Right upper quadrant pain: Secondary | ICD-10-CM | POA: Diagnosis not present

## 2022-10-10 DIAGNOSIS — R0789 Other chest pain: Secondary | ICD-10-CM | POA: Diagnosis not present

## 2022-10-10 DIAGNOSIS — R140 Abdominal distension (gaseous): Secondary | ICD-10-CM | POA: Diagnosis not present

## 2022-10-10 DIAGNOSIS — R9389 Abnormal findings on diagnostic imaging of other specified body structures: Secondary | ICD-10-CM

## 2022-10-10 DIAGNOSIS — K819 Cholecystitis, unspecified: Secondary | ICD-10-CM

## 2022-10-10 DIAGNOSIS — K760 Fatty (change of) liver, not elsewhere classified: Secondary | ICD-10-CM

## 2022-10-10 MED ORDER — OMEPRAZOLE 40 MG PO CPDR: 40.0000 mg | DELAYED_RELEASE_CAPSULE | Freq: Two times a day (BID) | ORAL | 11 refills | Status: DC

## 2022-10-10 NOTE — Progress Notes (Unsigned)
Primary Care Physician:  Daryel Gerald, FNP Primary Gastroenterologist:  Dr. Jena Gauss  Pre-Procedure History & Physical: HPI:  Carmen Morgan is a 67 y.o. female here for follow-up of bloating/right upper quadrant abdominal pain, reflux symptoms.  Historically, patient has been on Protonix twice daily.  She decided to take some of her husbands omeprazole 40 mg twice daily -reports this medication is abolished her reflux /bloating and right upper quadrant abdominal pain.  Continues to have episodes of atypical chest pain she has had a thorough evaluation over the past year including cardiac catheterization.  Chest pain is lower in location, retrosternal bandlike occasionally radiates into her jaw.  It lasted 45 minutes then goes away.  Has nitroglycerin on hand but is never taken it.  She has had a thorough evaluation and [place] and down in [place]. Her dysphagia for which she underwent empiric dilation recently (Normal-appearing esophagus) has resolved.  Chest CT from [facility] reviewed with Dr. Tyron Russell previously.  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  We scrutinized the films.  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  Dr. Tyron Russell recommended a cervical MRI for confirmation.  Her ultrasound revealed a 1.8 cm stone in her gallbladder without associated signs of Coley cystitis.  Fatty liver with normal LFTs.  She remains significantly obese.  Records states that she is on Wegovy.  She tells me this was recommended by her cardiologist.  She has not started this medication.  She is afraid to do so.  Past Medical History:  Diagnosis Date   Coronary atherosclerosis due to calcified coronary lesion    GERD (gastroesophageal reflux disease)    History of COVID-04 April 2018 and July 2022.   Hyperlipidemia    Hypertension    Nonobstructive atherosclerosis of coronary artery    Seasonal allergies     Past Surgical  History:  Procedure Laterality Date   ABDOMINAL HYSTERECTOMY  2014   BACK SURGERY  2012   COLONOSCOPY N/A 02/16/2019   pancolonic diverticulosis   ESOPHAGOGASTRODUODENOSCOPY N/A 01/18/2020   Procedure: ESOPHAGOGASTRODUODENOSCOPY (EGD);  Surgeon: Corbin Ade, MD;  Location: [facility];  Service: Endoscopy;  Laterality: N/A;  2:45pm   left breast cystectomy  1997   LEFT HEART CATH AND CORONARY ANGIOGRAPHY N/A 05/14/2021   Procedure: LEFT HEART CATH AND CORONARY ANGIOGRAPHY;  Surgeon: Elder Negus, MD;  Location: [facility];  Service: Cardiovascular;  Laterality: N/A;   MALONEY DILATION N/A 01/18/2020   Procedure: Elease Hashimoto DILATION;  Surgeon: Corbin Ade, MD;  Location: [facility];  Service: Endoscopy;  Laterality: N/A;   URETHRAL SLING  07/2020   VEIN SURGERY  2013    Prior to Admission medications   Medication Sig Start Date End Date Taking? Authorizing Provider  acetaminophen (TYLENOL) 500 MG tablet Take 1,000 mg by mouth every 6 (six) hours as needed for moderate pain or headache.   Yes [provider]  amLODipine (NORVASC) 5 MG tablet Take 1 tablet (5 mg total) by mouth daily. 10/03/22 11/02/22 Yes Tolia, Sunit, DO  ascorbic acid (VITAMIN C) 250 MG CHEW Chew 250 mg by mouth daily.   Yes [provider]  aspirin 81 MG EC tablet Take 1 tablet (81 mg total) by mouth daily with breakfast. Swallow whole. 10/17/20  Yes Regalado, Belkys A, MD  Cholecalciferol (VITAMIN D3) 50 MCG (2000 UT) TABS Take  2,000 Units by mouth daily.    Yes [provider]  estradiol (ESTRACE) 0.5 MG tablet Take 0.5 mg by mouth at bedtime.    Yes [provider]  fexofenadine (ALLEGRA) 180 MG tablet Take 180 mg by mouth daily.   Yes [provider]  fluticasone (FLONASE) 50 MCG/ACT nasal spray Place 2 sprays into both nostrils daily.    Yes [provider]  hydrochlorothiazide (HYDRODIURIL) 25 MG tablet TAKE 1 TABLET BY MOUTH EVERY DAY IN  THE MORNING 01/10/22  Yes Tolia, Sunit, DO  isosorbide mononitrate (IMDUR) 30 MG 24 hr tablet TAKE 1 TABLET BY MOUTH EVERY DAY 09/03/22  Yes Tolia, Sunit, DO  lisinopril (ZESTRIL) 40 MG tablet TAKE 1 TABLET (40 MG TOTAL) BY MOUTH DAILY AT 10 PM. 09/08/22 03/07/23 Yes Tolia, Sunit, DO  Melatonin 10 MG CAPS Take 10 mg by mouth at bedtime.   Yes [provider]  nitroGLYCERIN (NITROSTAT) 0.4 MG SL tablet Place 1 tablet (0.4 mg total) under the tongue every 5 (five) minutes as needed for chest pain. If you require more than two tablets five minutes apart go to the nearest ER via EMS. 05/03/21 10/10/22 Yes Tolia, Sunit, DO  Probiotic Product (PROBIOTIC DAILY PO) Take 1 tablet by mouth daily.   Yes [provider]  rosuvastatin (CRESTOR) 10 MG tablet Take 1 tablet (10 mg total) by mouth daily. 05/28/22  Yes Schuh, McKenzi P, PA-C  pantoprazole (PROTONIX) 40 MG tablet TAKE 1 TABLET BY MOUTH TWICE A DAY Patient not taking: Reported on 10/10/2022 07/30/22   Timtohy Broski, Gerrit Friends, MD  Semaglutide-Weight Management (WEGOVY) 0.25 MG/0.5ML SOAJ INJECT 0.25 MG INTO THE SKIN ONCE A WEEK FOR 4 DOSES Patient not taking: Reported on 10/10/2022 10/08/22 10/30/22  Tolia, Sunit, DO  triazolam (HALCION) 0.25 MG tablet Take 1 tablet (0.25 mg total) by mouth once for 1 dose. 30 minutes prior to MRI. Patient not taking: Reported on 10/10/2022 10/08/21 06/24/22  Gelene Mink, NP    Allergies as of 10/10/2022 - Review Complete 10/10/2022  Allergen Reaction Noted   Penicillins Hives 10/29/2018   Vitamin b12 Nausea And Vomiting 10/29/2018   Vicks nyquil cough [doxylamine-dm] Palpitations 05/08/2021    Family History  Problem Relation Age of Onset   Diabetes Mother    Heart attack Mother    CAD Mother    Arthritis Mother    Cancer Father    Hypertension Father    COPD Sister    CAD Sister    Diabetes Sister    Testicular cancer Brother    Colon cancer Neg Hx     Social History   Socioeconomic History    Marital status: Married    Spouse name: Not on file   Number of children: 1   Years of education: Not on file   Highest education level: Not on file  Occupational History   Not on file  Tobacco Use   Smoking status: Former    Current packs/day: 0.00    Average packs/day: 0.5 packs/day for 40.0 years (20.0 ttl pk-yrs)    Types: Cigarettes    Start date: 04/25/1969    Quit date: 04/25/2009    Years since quitting: 13.4   Smokeless tobacco: Never  Vaping Use   Vaping status: Former  Substance and Sexual Activity   Alcohol use: Never   Drug use: Never   Sexual activity: Yes  Other Topics Concern   Not on file  Social History Narrative   Not on  file   Social Determinants of Health   Financial Resource Strain: Not on file  Food Insecurity: Not on file  Transportation Needs: Not on file  Physical Activity: Not on file  Stress: Not on file  Social Connections: Not on file  Intimate Partner Violence: Not on file    Review of Systems: See HPI, otherwise negative ROS  Physical Exam: BP (!) 147/70 (BP Location: Right Arm, Patient Position: Sitting, Cuff Size: Normal)   Pulse 85   Temp 97.6 F (36.4 C) (Oral)   Ht 5\' 1"  (1.549 m)   Wt 184 lb 9.6 oz (83.7 kg)   SpO2 96%   BMI 34.88 kg/m  General:   Alert,  Well-developed, well-nourished, pleasant and cooperative in NAD Neck:  Supple; no masses or thyromegaly. No significant cervical adenopathy. Lungs:  Clear throughout to auscultation.   No wheezes, crackles, or rhonchi. No acute distress. Heart:  Regular rate and rhythm; no murmurs, clicks, rubs,  or gallops. Abdomen: Non-distended, normal bowel sounds.  Soft and nontender without appreciable mass or hepatosplenomegaly.   Impression/Plan: 67 year old lady with bloating right upper quadrant abdominal pain responsive to PPI therapy.  Omeprazole works better than pantoprazole for this nice lady. 1.8 cm gallstone in her gallbladder without associated sonographic evidence of  cholecystitis.  Atypical chest pain as described with a negative cardiac workup.  Clinically, it seems to me the patient could be having an esophageal spasm or Prinzmetal's type angina.  EOE without dysphagia would be a much less likely cause of noncardiac chest pain. Cardiology reportedly has no further recommendations. There is an outside chance that she may be having some gallbladder symptoms manifesting as chest discomfort.  History of paraspinal cervical cyst present.  Likely totally benign and has nothing to do with her presenting symptoms.  MRI suggested by Dr. Tyron Russell for completion of assessment.  Hepatic steatosis with normal LFTs-likely benign fat fatty infiltration of the liver.  She is frustrated with her atypical chest pain.  Wants all bases covered.   Recommendations:  As discussed, we will switch your acid blocker medication to omeprazole 40 mg orally twice daily before breakfast and supper (new prescription dispense 60 with 11 refills)  MRI of your neck to confirm that the cyst seen is a benign paraspinal cyst  As discussed, we will send you to the general surgeon (Dr. Larae Grooms) to evaluate your symptoms further to assess whether or not gallstone disease could be contributing.  For fatty liver, regular exercise, weight loss, Daily coffee will be helpful.  Office visit here in 3 months.     Notice: This dictation was prepared with Dragon dictation along with smaller phrase technology. Any transcriptional errors that result from this process are unintentional and may not be corrected upon review.

## 2022-10-10 NOTE — Patient Instructions (Signed)
It was good to see you again today!  As discussed, we will switch your acid blocker medication to omeprazole 40 mg orally twice daily before breakfast and supper (new prescription dispense 60 with 11 refills)  MRI of your neck to confirm that the cyst seen is a benign paraspinal cyst  I am glad that you have had a thorough heart evaluation and your cardiologist says your intermittent chest pain is not coming from your heart.  Since your reflux symptoms are well-controlled, we have to consider the possibility that the gallstone in your gallbladder may be causing some unusual symptoms.  As discussed, we will send you to the general surgeon (Dr. Larae Grooms) to evaluate your symptoms further to assess whether or not gallstone disease could be contributing.  As discussed, for fatty liver regular exercise, weight loss, Daily coffee will be helpful.  Office visit here in 3 months.

## 2022-10-13 ENCOUNTER — Other Ambulatory Visit: Payer: Self-pay | Admitting: *Deleted

## 2022-10-13 ENCOUNTER — Telehealth: Payer: Self-pay | Admitting: *Deleted

## 2022-10-13 DIAGNOSIS — R9389 Abnormal findings on diagnostic imaging of other specified body structures: Secondary | ICD-10-CM

## 2022-10-13 NOTE — Telephone Encounter (Signed)
Pt informed of MRI appt date and time on 10/29/22, arrive at 7:30 am. She states she can't do that day. Pt was giving number to CS to call and reschedule.

## 2022-10-13 NOTE — Telephone Encounter (Signed)
Rourk, Gerrit Friends, MD  Elinor Dodge, LPN OK; order it just as he recommended       Previous Messages    ----- Message ----- From: Elinor Dodge, LPN Sent: 5/62/1308   8:10 AM EDT To: Corbin Ade, MD  Spoke with Vincenza Hews in MRI about the MRI of neck you wanted to order and he says it should be MRI C-Spine w/wo. Please advise. Thank yo

## 2022-10-13 NOTE — Progress Notes (Signed)
error 

## 2022-10-25 ENCOUNTER — Other Ambulatory Visit: Payer: Self-pay | Admitting: Cardiology

## 2022-10-25 DIAGNOSIS — I2089 Other forms of angina pectoris: Secondary | ICD-10-CM

## 2022-10-25 DIAGNOSIS — I2510 Atherosclerotic heart disease of native coronary artery without angina pectoris: Secondary | ICD-10-CM

## 2022-10-29 ENCOUNTER — Ambulatory Visit (HOSPITAL_COMMUNITY): Payer: Medicare Other

## 2022-11-13 ENCOUNTER — Other Ambulatory Visit: Payer: Self-pay

## 2022-11-13 ENCOUNTER — Encounter: Payer: Self-pay | Admitting: General Surgery

## 2022-11-13 ENCOUNTER — Ambulatory Visit (INDEPENDENT_AMBULATORY_CARE_PROVIDER_SITE_OTHER): Payer: Medicare Other | Admitting: General Surgery

## 2022-11-13 VITALS — BP 135/65 | HR 81 | Temp 98.0°F | Resp 18 | Ht 61.0 in | Wt 185.0 lb

## 2022-11-13 DIAGNOSIS — K8020 Calculus of gallbladder without cholecystitis without obstruction: Secondary | ICD-10-CM

## 2022-11-13 NOTE — Patient Instructions (Signed)
Cholelithiasis  Cholelithiasis is a disease in which gallstones form in the gallbladder. The gallbladder is an organ that stores bile. Bile is a fluid that helps to digest fats. Gallstones begin as small crystals and can slowly grow into stones. They may cause no symptoms until they block the gallbladder duct, or cystic duct, when the gallbladder tightens, or contracts, after food is eaten. This can cause pain and is known as a gallbladder attack, or biliary colic. There are two main types of gallstones: Cholesterol stones. These are the most common type of gallstone. These stones are made of hardened cholesterol and are usually yellow-green in color. Pigment stones. These are dark in color and are made of a red-yellow substance, called bilirubin,that forms when hemoglobin from red blood cells breaks down. What are the causes? This condition may be caused by too little or too much of the substances that are in bile. This can happen if the bile: Has too much bilirubin. This can happen in certain blood diseases, such as sickle cell anemia. Has too much cholesterol. Does not have enough bile salts. These salts help the body absorb and digest fats. It can also happen if the gallbladder is not emptying completely. This is common during pregnancy. What increases the risk? The following factors may make you more likely to develop this condition: Being older than 69 years of age. Eating a diet that is heavy in fried foods, fat, and refined carbohydrates, such as white bread and white rice. Being female. Having multiple pregnancies. Using medicines that contain female hormones (estrogen) for a long time. Having certain medical problems, such as: Diabetes mellitus. Obesity. Cystic fibrosis. Crohn's disease. Cirrhosis or other long-term (chronic) liver disease. Certain blood diseases, such as sickle cell anemia or leukemia. Having a family history of gallstones. Losing weight quickly. What are the  signs or symptoms? In many cases, having gallstones causes no symptoms. These are called silent gallstones. If a gallstone blocks your bile duct, it can cause a gallbladder attack. The main symptom of a gallbladder attack is sudden pain in the upper right part of the abdomen. The pain: Usually comes at night or after eating. Can last for one hour or more. Can spread to your right shoulder, back, or chest. Can feel like indigestion. This is discomfort, burning, or fullness in your upper abdomen. If the bile duct is blocked for more than a few hours, it can cause an infection or inflammation of your gallbladder (cholecystitis), liver, or pancreas. This can cause: Nausea or vomiting. Bloating. Pain in your abdomen that lasts for 5 hours or longer. Tenderness in your upper abdomen, often in the upper right section and under your rib cage. Fever or chills. Skin or the white parts of your eyes turning yellow (jaundice). This usually happens when a stone has blocked bile from passing through the bile duct. Dark pee (urine) or light-colored poop (stools). How is this diagnosed? This condition may be diagnosed based on: A physical exam. Your medical history. Ultrasound. CT scan. MRI. You may also have other tests, including: Blood tests to check for infection or inflammation. The HIDA scan to see the gallbladder and the bile ducts. An endoscope to check for blockage in the bile ducts. How is this treated? Treatment depends on the severity of your symptoms. Silent gallstones do not need treatment. You may need treatment if a blockage causes a gallbladder attack or other symptoms. Treatment may include: If symptoms are mild, you may care for yourself at home.  For mild symptoms: Stop eating and drinking for 12-24 hours. You may drink water and clear liquids. This helps to "cool down" your gallbladder. After 1 or 2 days, eat a diet of simple or clear foods, such as broths and crackers. Take medicines  for pain or nausea. Take antibiotics if you have an infection. If symptoms are severe, you may: Stay in the hospital for pain control or to treat severe infection. Have surgery to remove the gallbladder (cholecystectomy). This is the most common treatment if all other treatments have not worked. Take medicines to break up gallstones. Medicines may be used for up to 6-12 months. Have an procedure to capture and remove gallstones. Follow these instructions at home: Medicines Take over-the-counter and prescription medicines only as told by your health care provider. If you were prescribed antibiotics, take them as told by your provider. Do not stop using the antibiotic even if you start to feel better. Ask your provider if the medicine prescribed to you requires you to avoid driving or using machinery. Eating and drinking Drink enough fluid to keep your pee pale yellow. This is important during a gallbladder attack. Water and clear liquids are preferred. Follow a healthy diet. This includes: Reducing fatty foods, such as fried food and foods high in cholesterol. Reducing refined carbohydrates, such as white bread and white rice. Eating more fiber. Aim for foods such as almonds, fruit, and beans. General instructions Do not use any products that contain nicotine or tobacco. These products include cigarettes, chewing tobacco, and vaping devices, such as e-cigarettes. If you need help quitting, ask your provider. Maintain a healthy weight. Keep all follow-up visits. These may include seeing a specialist or a Careers adviser. Where to find more information [employer] of Diabetes and Digestive and Kidney Diseases: [email or web address] Contact a health care provider if: You think you have had a gallbladder attack. You have been diagnosed with silent gallstones and you develop indigestion or pain in your abdomen. You have pain from a gallbladder attack that lasts for more than 2 hours. You begin to have  attacks more often. You have nausea. You have dark pee or light-colored poop. Get help right away if: You have pain in your abdomen that lasts for more than 5 hours or is getting worse. You have a fever or chills. You have vomiting that does not go away. You develop jaundice. This information is not intended to replace advice given to you by your health care provider. Make sure you discuss any questions you have with your health care provider. Document Revised: 12/16/2021 Document Reviewed: 12/16/2021 Elsevier Patient Education  2024 [employer].

## 2022-11-13 NOTE — Progress Notes (Signed)
[facility] History and Physical  Reason for Referral: Gallstones, chest pain  Referring Physician: Dr. Jena Gauss, MD   Chief Complaint   New Patient (Initial Visit)     Carmen Morgan is a 67 y.o. female.  HPI: Ms. Brys is a 67 yo who had a heart attack in July 2022 and reports that since that time she had a tightness in her chest that radiated to her jaws and if it goes to one side it will go to the left side. She has been worked up with imaging, with cardiac follow ups and with GI, having endoscopies and esophageal stretching. She has been found to have a gallstone but denies ever having any pain in the RUQ, no epigastric pain, no pain with food, no nausea/vomiting or bloating. Her complaints are of chest tightness and she was referred to see if this was at all possibly related to her gallbladder.   She has never taken her nitroglycerin after any attack. Her last attack was this July and prior to that was in November.  The last one came on after drinking coffee and sitting and she says it lasted 45 minutes.   Past Medical History:  Diagnosis Date   Coronary atherosclerosis due to calcified coronary lesion    GERD (gastroesophageal reflux disease)    History of COVID-04 April 2018 and July 2022.   Hyperlipidemia    Hypertension    Nonobstructive atherosclerosis of coronary artery    Seasonal allergies     Past Surgical History:  Procedure Laterality Date   ABDOMINAL HYSTERECTOMY  2014   BACK SURGERY  2012   COLONOSCOPY N/A 02/16/2019   pancolonic diverticulosis   ESOPHAGOGASTRODUODENOSCOPY N/A 01/18/2020   Procedure: ESOPHAGOGASTRODUODENOSCOPY (EGD);  Surgeon: Corbin Ade, MD;  [facility];  Service: Endoscopy;  Laterality: N/A;  2:45pm   left breast cystectomy  1997   LEFT HEART CATH AND CORONARY ANGIOGRAPHY N/A 05/14/2021   Procedure: LEFT HEART CATH AND CORONARY ANGIOGRAPHY;  Surgeon: Elder Negus, MD;  [facility];  Service: Cardiovascular;  Laterality: N/A;   MALONEY DILATION N/A 01/18/2020   Procedure: Elease Hashimoto DILATION;  Surgeon: Corbin Ade, MD;  [facility];  Service: Endoscopy;  Laterality: N/A;   URETHRAL SLING  07/2020   VEIN SURGERY  2013    Family History  Problem Relation Age of Onset   Diabetes Mother    Heart attack Mother    CAD Mother    Arthritis Mother    Cancer Father    Hypertension Father    COPD Sister    CAD Sister    Diabetes Sister    Testicular cancer Brother    Colon cancer Neg Hx     Social History   Tobacco Use   Smoking status: Former    Current packs/day: 0.00    Average packs/day: 0.5 packs/day for 40.0 years (20.0 ttl pk-yrs)    Types: Cigarettes    Start date: 04/25/1969    Quit date: 04/25/2009    Years since quitting: 13.5   Smokeless tobacco: Never  Vaping Use   Vaping status: Former  Substance Use Topics   Alcohol use: Never   Drug use: Never    Medications: I have reviewed the patient's current medications. Allergies as of 11/13/2022       Reactions   Penicillins Hives   Did it involve swelling of the face/tongue/throat, SOB, or low BP? No Did it  involve sudden or severe rash/hives, skin peeling, or any reaction on the inside of your mouth or nose? No Did you need to seek medical attention at a hospital or doctor's office? No When did it last happen?      childhood allergy  If all above answers are "NO", may proceed with cephalosporin use.   Vitamin B12 Nausea And Vomiting   Codeine Palpitations   Vicks Nyquil Cough [doxylamine-dm] Palpitations   Irregular Heart Rate        Medication List        Accurate as of November 13, 2022  2:08 PM. If you have any questions, ask your nurse or doctor.          acetaminophen 500 MG tablet Commonly known as: TYLENOL Take 1,000 mg by mouth every 6 (six) hours as needed for moderate pain or headache.   amLODipine 5 MG tablet Commonly known as: NORVASC TAKE 1 TABLET (5  MG TOTAL) BY MOUTH DAILY.   ascorbic acid 250 MG Chew Commonly known as: VITAMIN C Chew 250 mg by mouth daily.   aspirin EC 81 MG tablet Take 1 tablet (81 mg total) by mouth daily with breakfast. Swallow whole.   estradiol 0.5 MG tablet Commonly known as: ESTRACE Take 0.5 mg by mouth at bedtime.   fexofenadine 180 MG tablet Commonly known as: ALLEGRA Take 180 mg by mouth daily.   fluticasone 50 MCG/ACT nasal spray Commonly known as: FLONASE Place 2 sprays into both nostrils daily.   hydrochlorothiazide 25 MG tablet Commonly known as: HYDRODIURIL TAKE 1 TABLET BY MOUTH EVERY DAY IN THE MORNING   isosorbide mononitrate 30 MG 24 hr tablet Commonly known as: IMDUR TAKE 1 TABLET BY MOUTH EVERY DAY   lisinopril 40 MG tablet Commonly known as: ZESTRIL TAKE 1 TABLET (40 MG TOTAL) BY MOUTH DAILY AT 10 PM.   Melatonin 10 MG Caps Take 10 mg by mouth at bedtime.   nitroGLYCERIN 0.4 MG SL tablet Commonly known as: Nitrostat Place 1 tablet (0.4 mg total) under the tongue every 5 (five) minutes as needed for chest pain. If you require more than two tablets five minutes apart go to the nearest ER via EMS.   omeprazole 40 MG capsule Commonly known as: PRILOSEC Take 1 capsule (40 mg total) by mouth 2 (two) times daily before a meal.   PROBIOTIC DAILY PO Take 1 tablet by mouth daily.   rosuvastatin 10 MG tablet Commonly known as: Crestor Take 1 tablet (10 mg total) by mouth daily.   triazolam 0.25 MG tablet Commonly known as: HALCION Take 1 tablet (0.25 mg total) by mouth once for 1 dose. 30 minutes prior to MRI.   Vitamin D3 50 MCG (2000 UT) Tabs Take 2,000 Units by mouth daily.   Wegovy 0.25 MG/0.5ML Soaj Generic drug: Semaglutide-Weight Management Inject 0.25 mg into the skin once a week.         ROS:  A comprehensive review of systems was negative except for: Cardiovascular: positive for chest pain, chest pressure/discomfort, and jaw pain Gastrointestinal:  positive for reflux symptoms Musculoskeletal: positive for back pain, neck pain, and joint pain Neurological: positive for numbness   Blood pressure 135/65, pulse 81, temperature 98 F (36.7 C), temperature source Oral, resp. rate 18, height 5\' 1"  (1.549 m), weight 185 lb (83.9 kg), SpO2 94%. Physical Exam Vitals reviewed.  HENT:     Head: Normocephalic.     Nose: Nose normal.  Eyes:     Extraocular Movements:  Extraocular movements intact.  Cardiovascular:     Rate and Rhythm: Normal rate and regular rhythm.  Pulmonary:     Effort: Pulmonary effort is normal.     Breath sounds: Normal breath sounds.  Abdominal:     General: There is no distension.     Palpations: Abdomen is soft.     Tenderness: There is no abdominal tenderness.  Musculoskeletal:        General: Normal range of motion.     Cervical back: Normal range of motion.  Skin:    General: Skin is warm.  Neurological:     General: No focal deficit present.     Mental Status: She is alert and oriented to person, place, and time.  Psychiatric:        Mood and Affect: Mood normal.        Behavior: Behavior normal.     Results: CLINICAL DATA:  Right upper quadrant pain   EXAM: ULTRASOUND ABDOMEN LIMITED RIGHT UPPER QUADRANT   COMPARISON:  None Available.   FINDINGS: Gallbladder:   A 1.8 cm stone is identified in the gallbladder. The gallbladder is otherwise normal in appearance. No Murphy's sign.   Common bile duct:   Diameter: 6.3 mm.  No intrahepatic ductal dilatation identified.   Liver:   Diffuse increased echogenicity. No focal mass. Portal vein is patent on color Doppler imaging with normal direction of blood flow towards the liver.   Other: None.   IMPRESSION: 1. The common bile duct is borderline in caliber measuring 6.3 mm. Recommend correlation with LFTs. 2. Cholelithiasis. 3. Diffuse increased echogenicity throughout the liver is nonspecific and may be seen with hepatic steatosis or  other underlying intrinsic liver disease.     Electronically Signed   By: Gerome Sam III M.D.   On: 07/25/2022 16:53  Assessment & Plan:  MARYBELLA CRAN is a 67 y.o. female with gallstones but no symptoms that really fit  gallstones and no symptoms that really fit atypical gallbladder pain that I have seen as she describes a chest tightness across her chest and into her jaw with some radiation at times into the left arm. She has been seen by cardiology who feels like it is more non cardiac in origin but also document "Microangina". She has not tried any nitroglycerin during any of these attacks.   Discussed real risk of surgery, anesthesia and that the surgery may not help her complaint as it is very atypical.   Discussed things to look out for regarding gallbladder pain and discussed possibly using the nitroglycerin to see if that changes or pain in any way on the next episode and then discussing those results with cardiology.   All questions were answered to the satisfaction of the patient and family.  If she is still at a loss and really wants to pursue cholecystectomy we can consider that in the future but at this time she is very reluctant to do any surgery as she does not think it is likely the cause either.    Lucretia Roers 11/13/2022, 2:08 PM

## 2022-11-25 ENCOUNTER — Telehealth: Payer: Self-pay

## 2022-11-25 NOTE — Telephone Encounter (Signed)
Pt called requesting one triazolam to be called in for when she goes to have her MRI done due to her being claustrophobic. Pt is requesting that this be sent to [facility] on [address] in [place]

## 2022-12-01 ENCOUNTER — Other Ambulatory Visit: Payer: Self-pay

## 2022-12-01 ENCOUNTER — Other Ambulatory Visit: Payer: Self-pay | Admitting: Surgery

## 2022-12-01 DIAGNOSIS — R200 Anesthesia of skin: Secondary | ICD-10-CM

## 2022-12-01 MED ORDER — ALPRAZOLAM 0.25 MG PO TABS: ORAL_TABLET | ORAL | 0 refills | Status: DC

## 2022-12-01 NOTE — Telephone Encounter (Signed)
Rx was sent in, pt was made aware.

## 2022-12-05 ENCOUNTER — Ambulatory Visit (HOSPITAL_COMMUNITY)
Admission: RE | Admit: 2022-12-05 | Discharge: 2022-12-05 | Disposition: A | Discharge status: Home / Self Care | Payer: Medicare Other | Source: Ambulatory Visit | Attending: Internal Medicine | Admitting: Internal Medicine

## 2022-12-05 DIAGNOSIS — R9389 Abnormal findings on diagnostic imaging of other specified body structures: Secondary | ICD-10-CM | POA: Insufficient documentation

## 2022-12-05 MED ORDER — GADOBUTROL 1 MMOL/ML IV SOLN
7.5000 mL | Freq: Once | INTRAVENOUS | Status: AC | PRN
  Administered 2022-12-05: 7.5 mL via INTRAVENOUS

## 2022-12-15 NOTE — Progress Notes (Signed)
Has been done, waiting on results.

## 2022-12-22 ENCOUNTER — Telehealth: Payer: Self-pay

## 2022-12-22 NOTE — Telephone Encounter (Signed)
-----   Message from Eula Listen sent at 12/22/2022  1:38 PM EDT -----  I think Althouse needs to come back and see  the APP Within the next 1 to 2 months if not already scheduled.

## 2022-12-22 NOTE — Telephone Encounter (Signed)
Pt was scheduled to see Ermalinda Memos on 01/05/2023

## 2022-12-22 NOTE — Telephone Encounter (Signed)
Pt called regarding results to recent imaging that was done on 12/05/2022. Pt is wanting to know the results. Please advise.

## 2022-12-22 NOTE — Telephone Encounter (Signed)
Contacted reading room and they stated that they will put it to be read next.

## 2023-01-02 ENCOUNTER — Ambulatory Visit: Payer: Self-pay | Admitting: Cardiology

## 2023-01-03 ENCOUNTER — Other Ambulatory Visit: Payer: Self-pay | Admitting: Cardiology

## 2023-01-03 DIAGNOSIS — I10 Essential (primary) hypertension: Secondary | ICD-10-CM

## 2023-01-03 NOTE — Progress Notes (Unsigned)
Referring Provider: Daryel Gerald, FNP Primary Care Physician:  Daryel Gerald, FNP Primary GI Physician: Dr. Jena Gauss  No chief complaint on file.   HPI:   Carmen Morgan is a 67 y.o. female presenting today for follow-up of bloating/right upper quadrant abdominal pain, reflux symptoms.  She had previously been on pantoprazole twice daily, but at her last visit in July 2024, she reported taking some of her husbands omeprazole 40 mg twice daily stating this had abolished her reflux/bloating and RUQ abdominal pain.  However she continued with episodes of atypical chest pain.  She had previously had thorough evaluation over the last year including cardiac catheterization.  She described the chest pain in her lower retrosternal area that was sometimes bandlike and radiated into her jaw all lasting 45 minutes.  Prior dysphagia resolved with prior empiric esophageal dilation.  CT chest from [facility] was reviewed with Dr. Tyron Russell as there was question of a duplication cyst in her upper esophagus. I was felt that patient had a paraspinal C4 cyst and recommended cervical MRI.  Overall, etiology of atypical chest pain was not clear.  Differentials included esophageal spasm, Prinzmetal's angina, less likely gallbladder etiology or symptoms related to paraspinal cervical cyst.  She was scheduled for cervical MRI and refer to general surgery to discuss whether or not gallstone disease could be contributing, and PPI was switched to omeprazole.  Consult with Dr. Henreitta Leber 11/13/2022 and felt that patient did not have symptoms that really fit gallstones and no symptoms that fit a typical gallbladder pain.  Noted cardiology felt symptoms were more noncardiac but also documented "micro angina".  Patient had never taken nitroglycerin during her attacks.  They discussed the risks of surgery and ultimately did not recommend pursuing cholecystectomy.  We discussed things to look out for regarding gallbladder pain and  discussed the possibility of using nitroglycerin to see if this would change her pain in any way during her next episode and then to discuss those results with cardiology.  MR cervical spine with and without contrast 12/05/2022: 1. 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 2. At C6-C7, moderate right and mild-to-moderate left foraminal stenosis with mild canal stenosis. 3. At C4-C5, mild to moderate right foraminal stenosis and mild canal stenosis.  Today:    How often/when to be the attacks occur?  Has she ever taken antacid during these attacks?***  ultrasound revealed a 1.8 cm stone in her gallbladder without associated signs of cholecystitis. Fatty liver with normal LFTs. ***  Past Medical History:  Diagnosis Date   Coronary atherosclerosis due to calcified coronary lesion    GERD (gastroesophageal reflux disease)    History of COVID-04 April 2018 and July 2022.   Hyperlipidemia    Hypertension    Nonobstructive atherosclerosis of coronary artery    Seasonal allergies     Past Surgical History:  Procedure Laterality Date   ABDOMINAL HYSTERECTOMY  2014   BACK SURGERY  2012   COLONOSCOPY N/A 02/16/2019   pancolonic diverticulosis   ESOPHAGOGASTRODUODENOSCOPY N/A 01/18/2020   Procedure: ESOPHAGOGASTRODUODENOSCOPY (EGD);  Surgeon: Corbin Ade, MD;  [facility];  Service: Endoscopy;  Laterality: N/A;  2:45pm   left breast cystectomy  1997   LEFT HEART CATH AND CORONARY ANGIOGRAPHY N/A 05/14/2021   Procedure: LEFT HEART CATH AND CORONARY ANGIOGRAPHY;  Surgeon: Elder Negus, MD;  [facility];  Service: Cardiovascular;  Laterality: N/A;   MALONEY DILATION  N/A 01/18/2020   Procedure: Elease Hashimoto DILATION;  Surgeon: Corbin Ade, MD;  [facility];  Service: Endoscopy;  Laterality: N/A;   URETHRAL SLING  07/2020   VEIN SURGERY  2013    Current Outpatient Medications  Medication  Sig Dispense Refill   acetaminophen (TYLENOL) 500 MG tablet Take 1,000 mg by mouth every 6 (six) hours as needed for moderate pain or headache.     ALPRAZolam (XANAX) 0.25 MG tablet Take 45 mins before procedure. 1 tablet 0   amLODipine (NORVASC) 5 MG tablet TAKE 1 TABLET (5 MG TOTAL) BY MOUTH DAILY. 90 tablet 1   ascorbic acid (VITAMIN C) 250 MG CHEW Chew 250 mg by mouth daily.     aspirin 81 MG EC tablet Take 1 tablet (81 mg total) by mouth daily with breakfast. Swallow whole. 30 tablet 11   Cholecalciferol (VITAMIN D3) 50 MCG (2000 UT) TABS Take 2,000 Units by mouth daily.      estradiol (ESTRACE) 0.5 MG tablet Take 0.5 mg by mouth at bedtime.      fexofenadine (ALLEGRA) 180 MG tablet Take 180 mg by mouth daily.     fluticasone (FLONASE) 50 MCG/ACT nasal spray Place 2 sprays into both nostrils daily.      hydrochlorothiazide (HYDRODIURIL) 25 MG tablet TAKE 1 TABLET BY MOUTH EVERY DAY IN THE MORNING 90 tablet 3   isosorbide mononitrate (IMDUR) 30 MG 24 hr tablet TAKE 1 TABLET BY MOUTH EVERY DAY 90 tablet 2   lisinopril (ZESTRIL) 40 MG tablet TAKE 1 TABLET (40 MG TOTAL) BY MOUTH DAILY AT 10 PM. 90 tablet 1   Melatonin 10 MG CAPS Take 10 mg by mouth at bedtime.     nitroGLYCERIN (NITROSTAT) 0.4 MG SL tablet Place 1 tablet (0.4 mg total) under the tongue every 5 (five) minutes as needed for chest pain. If you require more than two tablets five minutes apart go to the nearest ER via EMS. 30 tablet 0   omeprazole (PRILOSEC) 40 MG capsule Take 1 capsule (40 mg total) by mouth 2 (two) times daily before a meal. 60 capsule 11   Probiotic Product (PROBIOTIC DAILY PO) Take 1 tablet by mouth daily.     rosuvastatin (CRESTOR) 10 MG tablet Take 1 tablet (10 mg total) by mouth daily. 30 tablet 6   Semaglutide-Weight Management (WEGOVY) 0.25 MG/0.5ML SOAJ Inject 0.25 mg into the skin once a week. (Patient not taking: Reported on 11/13/2022) 4 mL 0   triazolam (HALCION) 0.25 MG tablet Take 1 tablet (0.25 mg  total) by mouth once for 1 dose. 30 minutes prior to MRI. (Patient not taking: Reported on 10/10/2022) 1 tablet 0   No current facility-administered medications for this visit.    Allergies as of 01/05/2023 - Review Complete 11/13/2022  Allergen Reaction Noted   Penicillins Hives 10/29/2018   Vitamin b12 Nausea And Vomiting 10/29/2018   Codeine Palpitations 10/29/2018   Vicks nyquil cough [doxylamine-dm] Palpitations 05/08/2021    Family History  Problem Relation Age of Onset   Diabetes Mother    Heart attack Mother    CAD Mother    Arthritis Mother    Cancer Father    Hypertension Father    COPD Sister    CAD Sister    Diabetes Sister    Testicular cancer Brother    Colon cancer Neg Hx     Social History   Socioeconomic History   Marital status: Married    Spouse name: Not on file  Number of children: 1   Years of education: Not on file   Highest education level: Not on file  Occupational History   Not on file  Tobacco Use   Smoking status: Former    Current packs/day: 0.00    Average packs/day: 0.5 packs/day for 40.0 years (20.0 ttl pk-yrs)    Types: Cigarettes    Start date: 04/25/1969    Quit date: 04/25/2009    Years since quitting: 13.7   Smokeless tobacco: Never  Vaping Use   Vaping status: Former  Substance and Sexual Activity   Alcohol use: Never   Drug use: Never   Sexual activity: Yes  Other Topics Concern   Not on file  Social History Narrative   Not on file   Social Determinants of Health   Financial Resource Strain: Not on file  Food Insecurity: Not on file  Transportation Needs: Not on file  Physical Activity: Not on file  Stress: Not on file  Social Connections: Not on file    Review of Systems: Gen: Denies fever, chills, anorexia. Denies fatigue, weakness, weight loss.  CV: Denies chest pain, palpitations, syncope, peripheral edema, and claudication. Resp: Denies dyspnea at rest, cough, wheezing, coughing up blood, and pleurisy. GI:  Denies vomiting blood, jaundice, and fecal incontinence.   Denies dysphagia or odynophagia. Derm: Denies rash, itching, dry skin Psych: Denies depression, anxiety, memory loss, confusion. No homicidal or suicidal ideation.  Heme: Denies bruising, bleeding, and enlarged lymph nodes.  Physical Exam: There were no vitals taken for this visit. General:   Alert and oriented. No distress noted. Pleasant and cooperative.  Head:  Normocephalic and atraumatic. Eyes:  Conjuctiva clear without scleral icterus. Heart:  S1, S2 present without murmurs appreciated. Lungs:  Clear to auscultation bilaterally. No wheezes, rales, or rhonchi. No distress.  Abdomen:  +BS, soft, non-tender and non-distended. No rebound or guarding. No HSM or masses noted. Msk:  Symmetrical without gross deformities. Normal posture. Extremities:  Without edema. Neurologic:  Alert and  oriented x4 Psych:  Normal mood and affect.    Assessment:     Plan:  ***   Ermalinda Memos, PA-C [facility] 01/05/2023

## 2023-01-05 ENCOUNTER — Ambulatory Visit (INDEPENDENT_AMBULATORY_CARE_PROVIDER_SITE_OTHER): Payer: Medicare Other | Admitting: Gastroenterology

## 2023-01-05 ENCOUNTER — Encounter: Payer: Self-pay | Admitting: Gastroenterology

## 2023-01-05 VITALS — BP 137/74 | HR 82 | Temp 98.2°F | Ht 62.0 in | Wt 185.8 lb

## 2023-01-05 DIAGNOSIS — R140 Abdominal distension (gaseous): Secondary | ICD-10-CM

## 2023-01-05 DIAGNOSIS — Z8719 Personal history of other diseases of the digestive system: Secondary | ICD-10-CM | POA: Diagnosis not present

## 2023-01-05 DIAGNOSIS — K219 Gastro-esophageal reflux disease without esophagitis: Secondary | ICD-10-CM

## 2023-01-05 NOTE — Patient Instructions (Addendum)
Please have stool test completed at [facility].  Avoid common gas producing items including broccoli, cabbage, cauliflower, beans, Brussels sprouts, artificial sweeteners, carbonated beverages, drinking through a straw, hard candy, chewing gum.  You may try taking Beano before meals.  Continue omeprazole 40 mg twice daily.  Further recommendations to follow the stool test.   It was nice to meet you today!  Ermalinda Memos, PA-C [facility]

## 2023-01-12 ENCOUNTER — Other Ambulatory Visit: Payer: Self-pay | Admitting: Cardiology

## 2023-01-12 DIAGNOSIS — I10 Essential (primary) hypertension: Secondary | ICD-10-CM

## 2023-01-14 ENCOUNTER — Other Ambulatory Visit: Payer: Self-pay | Admitting: Gastroenterology

## 2023-01-14 DIAGNOSIS — R140 Abdominal distension (gaseous): Secondary | ICD-10-CM

## 2023-01-14 DIAGNOSIS — K580 Irritable bowel syndrome with diarrhea: Secondary | ICD-10-CM

## 2023-01-14 LAB — PANCREATIC ELASTASE, FECAL: Pancreatic Elastase, Fecal: >800 ug Elast./g (ref >200)

## 2023-01-14 MED ORDER — RIFAXIMIN 550 MG PO TABS
550.0000 mg | ORAL_TABLET | Freq: Three times a day (TID) | ORAL | 0 refills | Status: AC
Start: 2023-01-14 — End: 2023-01-28

## 2023-01-16 ENCOUNTER — Encounter: Payer: Self-pay | Admitting: *Deleted

## 2023-01-17 ENCOUNTER — Other Ambulatory Visit: Payer: Self-pay | Admitting: Gastroenterology

## 2023-01-17 DIAGNOSIS — R140 Abdominal distension (gaseous): Secondary | ICD-10-CM

## 2023-01-17 DIAGNOSIS — K580 Irritable bowel syndrome with diarrhea: Secondary | ICD-10-CM

## 2023-01-21 ENCOUNTER — Other Ambulatory Visit: Payer: Self-pay | Admitting: Gastroenterology

## 2023-01-21 DIAGNOSIS — R140 Abdominal distension (gaseous): Secondary | ICD-10-CM

## 2023-01-21 MED ORDER — SULFAMETHOXAZOLE-TRIMETHOPRIM 800-160 MG PO TABS
1.0000 | ORAL_TABLET | Freq: Two times a day (BID) | ORAL | 0 refills | Status: DC
Start: 2023-01-21 — End: 2023-05-20

## 2023-01-21 NOTE — Telephone Encounter (Signed)
Looks like PA for Burman Blacksmith was denied. 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 Once patient is aware, I will send new Rx to her pharmacy.

## 2023-01-21 NOTE — Telephone Encounter (Signed)
I have actually sent in a prescription for Bactrim twice a day.  Due to a history of QT prolongation, Cipro not recommended.

## 2023-01-21 NOTE — Telephone Encounter (Signed)
Noted  

## 2023-01-21 NOTE — Telephone Encounter (Signed)
I am sending in new Rx for Cipro.

## 2023-01-22 ENCOUNTER — Encounter: Payer: Self-pay | Admitting: *Deleted

## 2023-01-22 LAB — LAB REPORT - SCANNED
A1c: 6.5
EGFR: 92

## 2023-01-29 ENCOUNTER — Other Ambulatory Visit: Payer: Self-pay | Admitting: Physician Assistant

## 2023-03-16 ENCOUNTER — Ambulatory Visit: Payer: Medicare Other | Attending: Cardiology | Admitting: Cardiology

## 2023-03-16 ENCOUNTER — Encounter: Payer: Self-pay | Admitting: Cardiology

## 2023-03-16 VITALS — BP 140/66 | HR 73 | Resp 16 | Ht 62.0 in | Wt 193.8 lb

## 2023-03-16 DIAGNOSIS — I2510 Atherosclerotic heart disease of native coronary artery without angina pectoris: Secondary | ICD-10-CM | POA: Insufficient documentation

## 2023-03-16 DIAGNOSIS — I700 Atherosclerosis of aorta: Secondary | ICD-10-CM | POA: Diagnosis present

## 2023-03-16 DIAGNOSIS — I6523 Occlusion and stenosis of bilateral carotid arteries: Secondary | ICD-10-CM | POA: Diagnosis present

## 2023-03-16 DIAGNOSIS — I25118 Atherosclerotic heart disease of native coronary artery with other forms of angina pectoris: Secondary | ICD-10-CM | POA: Diagnosis not present

## 2023-03-16 DIAGNOSIS — I2584 Coronary atherosclerosis due to calcified coronary lesion: Secondary | ICD-10-CM | POA: Diagnosis present

## 2023-03-16 DIAGNOSIS — I2089 Other forms of angina pectoris: Secondary | ICD-10-CM | POA: Insufficient documentation

## 2023-03-16 DIAGNOSIS — I10 Essential (primary) hypertension: Secondary | ICD-10-CM | POA: Insufficient documentation

## 2023-03-16 NOTE — Patient Instructions (Signed)
Medication Instructions:  Your physician recommends that you continue on your current medications as directed. Please refer to the Current Medication list given to you today.  CHANGE Amlodipine to be taken at night time  *If you need a refill on your cardiac medications before your next appointment, please call your pharmacy*  Lab Work: None ordered today. If you have labs (blood work) drawn today and your tests are completely normal, you will receive your results only by: MyChart Message (if you have MyChart) OR A paper copy in the mail If you have any lab test that is abnormal or we need to change your treatment, we will call you to review the results.  Testing/Procedures: None ordered today.  Follow-Up: At [facility], you and your health needs are our priority.  As part of our continuing mission to provide you with exceptional heart care, we have created designated Provider Care Teams.  These Care Teams include your primary Cardiologist (physician) and Advanced Practice Providers (APPs -  Physician Assistants and Nurse Practitioners) who all work together to provide you with the care you need, when you need it.  We recommend signing up for the patient portal called "MyChart".  Sign up information is provided on this After Visit Summary.  MyChart is used to connect with patients for Virtual Visits (Telemedicine).  Patients are able to view lab/test results, encounter notes, upcoming appointments, etc.  Non-urgent messages can be sent to your provider as well.   To learn more about what you can do with MyChart, go to [email or web address].    Your next appointment:   1 year(s)  The format for your next appointment:   In Person  Provider:   Tessa Lerner, DO {

## 2023-03-16 NOTE — Progress Notes (Signed)
Cardiology Office Note:  .   Date:  03/16/2023  ID:  Carmen Morgan, DOB 12/17/1955, MRN [id number] PCP:  Daryel Gerald, FNP  Former Cardiology Providers: N/A [facility] Providers Cardiologist:  Tessa Lerner, DO , [facility] (established care 10/14/2020) Electrophysiologist:  None  Click to update primary MD,subspecialty MD or APP then REFRESH:1}    Chief Complaint  Patient presents with   Nonobstructive atherosclerosis of coronary artery   microvascular angina   Follow-up    History of Present Illness: Carmen Morgan is a 67 y.o. Caucasian female whose past medical history and cardiovascular risk factors includes: Mild nonobstructive CAD, mild coronary artery calcification, history of COVID-19 infection (January 2020 and July 2022), hypertension, on hormone replacement therapy, former smoker, postmenopausal female, advanced age, obesity due to excess calories.   Presented to the ED in July 2022 with NSTEMI and underwent coronary CTA which noted mild CAC and mild nonobstructive CAD.  She subsequently underwent left heart catheterization was found to have no significant cardiopulmonary disease.  Since then she has been on medical therapy for microvascular disease/dysfunction.  She presents today for 74-month follow-up visit.  For last 6 months she denies anginal chest pain.  She has infrequently precordial discomfort which is predominately noncardiac in overall intensity frequency and duration has not changed.  She is currently on Imdur and amlodipine for microvascular dysfunction.  She supposed be taking Norvasc 5 mg p.o. daily but currently has reduced to to 2.5 mg p.o. daily due to feeling lightheaded and dizzy.  She takes majority of her blood pressure medications in the morning which may be contributory.  She was supposed to see vascular surgery given her carotid disease-patient states that this still needs to be scheduled.  Review of Systems: .   Review of Systems   Cardiovascular:  Negative for chest pain, claudication, irregular heartbeat, leg swelling, near-syncope, orthopnea, palpitations, paroxysmal nocturnal dyspnea and syncope.  Respiratory:  Negative for shortness of breath.   Hematologic/Lymphatic: Negative for bleeding problem.    Studies Reviewed:   EKG: EKG Interpretation Date/Time:  Monday March 16 2023 11:01:38 EST Ventricular Rate:  78 PR Interval:  142 QRS Duration:  130 QT Interval:  410 QTC Calculation: 467 R Axis:   -19  Text Interpretation: Normal sinus rhythm Left bundle branch block When compared with ECG of 10/13/2020 No significant change since last tracing Confirmed by Tessa Lerner [phone]) on 03/16/2023 11:22:56 AM  Echocardiogram: 10/14/2020: LVEF 65-70%, no regional wall motion abnormalities, mild LVH, trivial MR.   Stress test: None   Heart catheterization: 05/14/2021: LM: Normal LAD: No significant disease Lcx: Mid 20% disease RCA: Prox 20% disease   LVEDP 26 mmHg No obstructive CAD   Recommend Imdur 30 mg for possible microvascular disease   Carotid Duplex 10/15/2020:  Right Carotid: Velocities in the right ICA are consistent with a 60-79% stenosis. The ECA appears >50% stenosed.  Left Carotid: Velocities in the left ICA are consistent with a 40-59%  stenosis. Non-hemodynamically significant plaque <50% noted in the CCA.  Vertebrals:  Right vertebral artery demonstrates antegrade flow. Left vertebral artery demonstrates retrograde flow.  Subclavians: Left subclavian artery was stenotic. Normal flow hemodynamics  were seen in the right subclavian artery.    Coronary CTA 10/15/2020: 1. Total coronary calcium score of 14.1. This was 60th percentile for age and sex matched control. 2. Normal coronary origin with right dominance. 3. CAD-RADS = 2. Minimal stenosis (0-24%) due to noncalcified plaque in the proximal  LAD. Mild stenosis (25-49%) within the mid LCx due to mixed plaque. Minimal stenosis (0-24%)  due to mixed plaque in proximal RCA. 4. Aortic atherosclerosis within descending aorta.  RADIOLOGY: NA  Risk Assessment/Calculations:   NA   Labs:       Latest Ref Rng & Units 05/07/2021    9:41 AM 10/16/2020    3:48 AM 10/15/2020    2:25 AM  CBC  WBC 3.4 - 10.8 x10E3/uL 7.0  7.5  7.2   Hemoglobin 11.1 - 15.9 g/dL 11.9  14.7  82.9   Hematocrit 34.0 - 46.6 % 38.6  33.9  33.9   Platelets 150 - 450 x10E3/uL 227  242  242        Latest Ref Rng & Units 09/19/2021    1:52 PM 05/30/2021    9:39 AM 05/07/2021    9:41 AM  BMP  Glucose 70 - 99 mg/dL  562  130   BUN 8 - 27 mg/dL  18  15   Creatinine 8.65 - 1.00 mg/dL 7.84  6.96  2.95   BUN/Creat Ratio 12 - 28  29  21    Sodium 134 - 144 mmol/L  139  142   Potassium 3.5 - 5.2 mmol/L  4.2  5.0   Chloride 96 - 106 mmol/L  102  105   CO2 20 - 29 mmol/L  25  24   Calcium 8.7 - 10.3 mg/dL  28.4  13.2       Latest Ref Rng & Units 07/14/2022    8:36 AM 09/19/2021    1:52 PM 05/30/2021    9:39 AM  CMP  Glucose 70 - 99 mg/dL   440   BUN 8 - 27 mg/dL   18   Creatinine 1.02 - 1.00 mg/dL  7.25  3.66   Sodium 440 - 144 mmol/L   139   Potassium 3.5 - 5.2 mmol/L   4.2   Chloride 96 - 106 mmol/L   102   CO2 20 - 29 mmol/L   25   Calcium 8.7 - 10.3 mg/dL   34.7   Total Protein 6.0 - 8.5 g/dL 6.3     Total Bilirubin 0.0 - 1.2 mg/dL 0.3     Alkaline Phos 44 - 121 IU/L 101     AST 0 - 40 IU/L 21     ALT 0 - 32 IU/L 16       Lab Results  Component Value Date   CHOL 140 05/07/2021   HDL 52 05/07/2021   LDLCALC 69 05/07/2021   LDLDIRECT 67 05/07/2021   TRIG 103 05/07/2021   CHOLHDL 3.2 10/15/2020   No results for input(s): "LIPOA" in the last 8760 hours. No components found for: "NTPROBNP" No results for input(s): "PROBNP" in the last 8760 hours. No results for input(s): "TSH" in the last 8760 hours.  Physical Exam:    Today's Vitals   03/16/23 1058  BP: (!) 140/66  Pulse: 73  Resp: 16  SpO2: 94%  Weight: 193 lb 12.8 oz (87.9 kg)   Height: 5\' 2"  (1.575 m)   Body mass index is 35.45 kg/m. Wt Readings from Last 3 Encounters:  03/16/23 193 lb 12.8 oz (87.9 kg)  01/05/23 185 lb 12.8 oz (84.3 kg)  11/13/22 185 lb (83.9 kg)    Physical Exam  Constitutional: No distress.  hemodynamically stable  Neck: No JVD present. Neck adenopathy: .diaglistnonumbers.  Cardiovascular: Normal rate, regular rhythm, S1 normal and S2 normal. Exam reveals  no gallop, no S3 and no S4.  No murmur heard. Pulses:      Carotid pulses are  on the right side with bruit and  on the left side with bruit. Pulmonary/Chest: Effort normal and breath sounds normal. No stridor. She has no wheezes. She has no rales.  Abdominal: Soft. Bowel sounds are normal. She exhibits no distension. There is no abdominal tenderness.  Musculoskeletal:        General: No edema.     Cervical back: Neck supple.  Neurological: She is alert and oriented to person, place, and time. She has intact cranial nerves (2-12).  Skin: Skin is warm and moist.    Impression & Recommendation(s):  Impression:   ICD-10-CM   1. Microvascular angina (HCC)  I20.89 EKG 12-Lead    2. Coronary atherosclerosis due to calcified coronary lesion  I25.10    I25.84     3. Benign hypertension  I10     4. Bilateral carotid artery stenosis  I65.23     5. Aortic atherosclerosis (HCC)  I70.0        Recommendation(s):  Microvascular angina (HCC) Coronary atherosclerosis due to calcified coronary lesion Aortic atherosclerosis (HCC) Chronic and stable. Echo: LVEF 65 to 70%, no significant valvular heart disease. Coronary CTA: Mild CAC (total CAC 14.1 AU, 60th percentile) mild nonobstructive CAD. LHC: no significant epicardial coronary disease. No reoccurrence of anginal discomfort over the last 6 months. Continue Imdur 30 mg p.o. daily. Continue amlodipine 5 mg p.o. daily. No use of sublingual nitroglycerin tablets since the last office visit. Will hold off on medication titration at  this time. She has nonspecific precordial discomfort which has not worsened in intensity frequency and duration the last office visit.  However, if the symptoms do worsen in intensity frequency or duration or has typical chest pain she is advised to seek medical attention sooner.  Patient verbalized understanding. Reemphasized the importance of secondary prevention with focus on improving her modifiable cardiovascular risk factors such as glycemic control, lipid management, blood pressure control, weight loss.  Benign hypertension Office blood pressures are not at goal. I have advised her to take lisinopril and amlodipine at night. I have advised her to take Imdur and hydrochlorothiazide in the morning. Reemphasized importance of low-salt diet. Blood pressure log recommended to see if additional medication titration is warranted-she will follow-up with PCP  Bilateral carotid artery stenosis Follows with vascular surgery. Patient plans to follow-up with her vascular provider in the coming months Will defer management to vascular surgery at this time.  Orders Placed:  Orders Placed This Encounter  Procedures   EKG 12-Lead   Discussed management of at least 2 comorbid conditions, independently ordered and reviewed EKG, reviewed prior ischemic workup with regards to medical decision making, discussed medication management and how to take her antihypertensive medications, coordination of care.  Final Medication List:   No orders of the defined types were placed in this encounter.   There are no discontinued medications.   Current Outpatient Medications:    acetaminophen (TYLENOL) 500 MG tablet, Take 1,000 mg by mouth every 6 (six) hours as needed for moderate pain or headache., Disp: , Rfl:    amLODipine (NORVASC) 5 MG tablet, TAKE 1 TABLET (5 MG TOTAL) BY MOUTH DAILY., Disp: 90 tablet, Rfl: 1   ascorbic acid (VITAMIN C) 250 MG CHEW, Chew 250 mg by mouth daily., Disp: , Rfl:    aspirin 81  MG EC tablet, Take 1 tablet (81 mg total) by mouth  daily with breakfast. Swallow whole., Disp: 30 tablet, Rfl: 11   Cholecalciferol (VITAMIN D3) 50 MCG (2000 UT) TABS, Take 2,000 Units by mouth daily. , Disp: , Rfl:    estradiol (ESTRACE) 0.5 MG tablet, Take 0.5 mg by mouth at bedtime. , Disp: , Rfl:    fexofenadine (ALLEGRA) 180 MG tablet, Take 180 mg by mouth daily., Disp: , Rfl:    fluticasone (FLONASE) 50 MCG/ACT nasal spray, Place 2 sprays into both nostrils daily. , Disp: , Rfl:    hydrochlorothiazide (HYDRODIURIL) 25 MG tablet, TAKE 1 TABLET BY MOUTH EVERY DAY IN THE MORNING, Disp: 90 tablet, Rfl: 2   isosorbide mononitrate (IMDUR) 30 MG 24 hr tablet, TAKE 1 TABLET BY MOUTH EVERY DAY, Disp: 90 tablet, Rfl: 2   lisinopril (ZESTRIL) 40 MG tablet, Take 1 tablet (40 mg total) by mouth daily at 10 pm., Disp: 90 tablet, Rfl: 2   Melatonin 10 MG CAPS, Take 10 mg by mouth at bedtime., Disp: , Rfl:    nitroGLYCERIN (NITROSTAT) 0.4 MG SL tablet, Place 1 tablet (0.4 mg total) under the tongue every 5 (five) minutes as needed for chest pain. If you require more than two tablets five minutes apart go to the nearest ER via EMS., Disp: 30 tablet, Rfl: 0   omeprazole (PRILOSEC) 40 MG capsule, Take 1 capsule (40 mg total) by mouth 2 (two) times daily before a meal., Disp: 60 capsule, Rfl: 11   Probiotic Product (PROBIOTIC DAILY PO), Take 1 tablet by mouth daily., Disp: , Rfl:    rosuvastatin (CRESTOR) 10 MG tablet, Take 1 tablet (10 mg total) by mouth daily., Disp: 30 tablet, Rfl: 6   sulfamethoxazole-trimethoprim (BACTRIM DS) 800-160 MG tablet, Take 1 tablet by mouth 2 (two) times daily., Disp: 20 tablet, Rfl: 0  Consent:   N/A  Disposition:   1 year follow-up sooner if needed  Her questions and concerns were addressed to her satisfaction. She voices understanding of the recommendations provided during this encounter.    Signed, Tessa Lerner, DO, [facility]  [facility]  [address]  [address] [place], [place] [address] 03/16/2023 6:15 PM

## 2023-04-08 ENCOUNTER — Other Ambulatory Visit: Payer: Self-pay | Admitting: Physician Assistant

## 2023-04-09 ENCOUNTER — Encounter: Payer: Self-pay | Admitting: Gastroenterology

## 2023-05-01 ENCOUNTER — Other Ambulatory Visit: Payer: Self-pay | Admitting: Cardiology

## 2023-05-01 DIAGNOSIS — I2510 Atherosclerotic heart disease of native coronary artery without angina pectoris: Secondary | ICD-10-CM

## 2023-05-01 DIAGNOSIS — I2089 Other forms of angina pectoris: Secondary | ICD-10-CM

## 2023-05-13 ENCOUNTER — Ambulatory Visit: Payer: Medicare Other | Admitting: Gastroenterology

## 2023-05-17 NOTE — Progress Notes (Unsigned)
 Referring Provider: Daryel Gerald, FNP Primary Care Physician:  Daryel Gerald, FNP Primary GI Physician: Dr. Jena Gauss  No chief complaint on file.   HPI:   Carmen Morgan is a 68 y.o. female with history of GERD, dysphagia improved with prior empiric esophageal dilation, chronic postprandial gas/bloating, previously with diarrhea, presenting today for follow-up.   Last seen in the office 01/05/2023.  Advised to continue omeprazole 40 mg twice daily for GERD as symptoms were well-controlled.  Notably, she had previously been on pantoprazole twice daily.  She continued with chronic, postprandial gas/bloating for the last 10 years.  No improvement with Gas-X or Lactaid.  Prior diarrhea had improved with daily probiotic.  Prior celiac screen was normal in 2023.  Recommended checking fecal elastase for EPI.  If normal, would consider course of Xifaxan for SIBO/IBS.  Also discussed common gas producing items to avoid.  Fecal elastase was normal.  Recommended course of Xifaxan 550 mg 3 times daily.  Unfortunately, this was too expensive.  Empirically, sent in Bactrim twice daily for SIBO.  Recommended 93-month follow-up.  Today:    Past Medical History:  Diagnosis Date   Coronary atherosclerosis due to calcified coronary lesion    GERD (gastroesophageal reflux disease)    History of COVID-04 April 2018 and July 2022.   Hyperlipidemia    Hypertension    Nonobstructive atherosclerosis of coronary artery    Seasonal allergies     Past Surgical History:  Procedure Laterality Date   ABDOMINAL HYSTERECTOMY  2014   BACK SURGERY  2012   COLONOSCOPY N/A 02/16/2019   pancolonic diverticulosis   ESOPHAGOGASTRODUODENOSCOPY N/A 01/18/2020   Procedure: ESOPHAGOGASTRODUODENOSCOPY (EGD);  Surgeon: Corbin Ade, MD;  Location: [facility];  Service: Endoscopy;  Laterality: N/A;  2:45pm   left breast cystectomy  1997   LEFT HEART CATH AND CORONARY ANGIOGRAPHY N/A 05/14/2021    Procedure: LEFT HEART CATH AND CORONARY ANGIOGRAPHY;  Surgeon: Elder Negus, MD;  Location: [facility];  Service: Cardiovascular;  Laterality: N/A;   MALONEY DILATION N/A 01/18/2020   Procedure: Elease Hashimoto DILATION;  Surgeon: Corbin Ade, MD;  Location: [facility];  Service: Endoscopy;  Laterality: N/A;   URETHRAL SLING  07/2020   VEIN SURGERY  2013    Current Outpatient Medications  Medication Sig Dispense Refill   acetaminophen (TYLENOL) 500 MG tablet Take 1,000 mg by mouth every 6 (six) hours as needed for moderate pain or headache.     amLODipine (NORVASC) 5 MG tablet Take 1 tablet (5 mg total) by mouth daily. 90 tablet 3   ascorbic acid (VITAMIN C) 250 MG CHEW Chew 250 mg by mouth daily.     aspirin 81 MG EC tablet Take 1 tablet (81 mg total) by mouth daily with breakfast. Swallow whole. 30 tablet 11   Cholecalciferol (VITAMIN D3) 50 MCG (2000 UT) TABS Take 2,000 Units by mouth daily.      estradiol (ESTRACE) 0.5 MG tablet Take 0.5 mg by mouth at bedtime.      fexofenadine (ALLEGRA) 180 MG tablet Take 180 mg by mouth daily.     fluticasone (FLONASE) 50 MCG/ACT nasal spray Place 2 sprays into both nostrils daily.      hydrochlorothiazide (HYDRODIURIL) 25 MG tablet TAKE 1 TABLET BY MOUTH EVERY DAY IN THE MORNING 90 tablet 2   isosorbide mononitrate (IMDUR) 30 MG 24 hr tablet TAKE 1 TABLET BY MOUTH EVERY DAY 90 tablet 2  lisinopril (ZESTRIL) 40 MG tablet Take 1 tablet (40 mg total) by mouth daily at 10 pm. 90 tablet 2   Melatonin 10 MG CAPS Take 10 mg by mouth at bedtime.     nitroGLYCERIN (NITROSTAT) 0.4 MG SL tablet Place 1 tablet (0.4 mg total) under the tongue every 5 (five) minutes as needed for chest pain. If you require more than two tablets five minutes apart go to the nearest ER via EMS. 30 tablet 0   omeprazole (PRILOSEC) 40 MG capsule Take 1 capsule (40 mg total) by mouth 2 (two) times daily before a meal. 60 capsule 11   Probiotic Product (PROBIOTIC DAILY PO)  Take 1 tablet by mouth daily.     rosuvastatin (CRESTOR) 10 MG tablet Take 1 tablet (10 mg total) by mouth daily. 30 tablet 6   sulfamethoxazole-trimethoprim (BACTRIM DS) 800-160 MG tablet Take 1 tablet by mouth 2 (two) times daily. 20 tablet 0   No current facility-administered medications for this visit.    Allergies as of 05/20/2023 - Review Complete 03/16/2023  Allergen Reaction Noted   Penicillins Hives 10/29/2018   Vitamin b12 Nausea And Vomiting 10/29/2018   Codeine Palpitations 10/29/2018   Vicks nyquil cough [doxylamine-dm] Palpitations 05/08/2021    Family History  Problem Relation Age of Onset   Diabetes Mother    Heart attack Mother    CAD Mother    Arthritis Mother    Cancer Father    Hypertension Father    COPD Sister    CAD Sister    Diabetes Sister    Testicular cancer Brother    Colon cancer Neg Hx     Social History   Socioeconomic History   Marital status: Married    Spouse name: Not on file   Number of children: 1   Years of education: Not on file   Highest education level: Not on file  Occupational History   Not on file  Tobacco Use   Smoking status: Former    Current packs/day: 0.00    Average packs/day: 0.5 packs/day for 40.0 years (20.0 ttl pk-yrs)    Types: Cigarettes    Start date: 04/25/1969    Quit date: 04/25/2009    Years since quitting: 14.0   Smokeless tobacco: Never  Vaping Use   Vaping status: Former  Substance and Sexual Activity   Alcohol use: Never   Drug use: Never   Sexual activity: Yes  Other Topics Concern   Not on file  Social History Narrative   Not on file   Social Drivers of Health   Financial Resource Strain: Not on file  Food Insecurity: Not on file  Transportation Needs: Not on file  Physical Activity: Not on file  Stress: Not on file  Social Connections: Not on file    Review of Systems: Gen: Denies fever, chills, anorexia. Denies fatigue, weakness, weight loss.  CV: Denies chest pain, palpitations,  syncope, peripheral edema, and claudication. Resp: Denies dyspnea at rest, cough, wheezing, coughing up blood, and pleurisy. GI: Denies vomiting blood, jaundice, and fecal incontinence.   Denies dysphagia or odynophagia. Derm: Denies rash, itching, dry skin Psych: Denies depression, anxiety, memory loss, confusion. No homicidal or suicidal ideation.  Heme: Denies bruising, bleeding, and enlarged lymph nodes.  Physical Exam: There were no vitals taken for this visit. General:   Alert and oriented. No distress noted. Pleasant and cooperative.  Head:  Normocephalic and atraumatic. Eyes:  Conjuctiva clear without scleral icterus. Heart:  S1, S2 present  without murmurs appreciated. Lungs:  Clear to auscultation bilaterally. No wheezes, rales, or rhonchi. No distress.  Abdomen:  +BS, soft, non-tender and non-distended. No rebound or guarding. No HSM or masses noted. Msk:  Symmetrical without gross deformities. Normal posture. Extremities:  Without edema. Neurologic:  Alert and  oriented x4 Psych:  Normal mood and affect.    Assessment:     Plan:  ***   Ermalinda Memos, PA-C [facility] 05/20/2023

## 2023-05-20 ENCOUNTER — Ambulatory Visit (INDEPENDENT_AMBULATORY_CARE_PROVIDER_SITE_OTHER): Payer: Medicare Other | Admitting: Gastroenterology

## 2023-05-20 ENCOUNTER — Encounter: Payer: Self-pay | Admitting: Gastroenterology

## 2023-05-20 VITALS — BP 116/71 | HR 83 | Temp 98.1°F | Ht 61.0 in | Wt 190.8 lb

## 2023-05-20 DIAGNOSIS — K219 Gastro-esophageal reflux disease without esophagitis: Secondary | ICD-10-CM | POA: Diagnosis not present

## 2023-05-20 DIAGNOSIS — R1013 Epigastric pain: Secondary | ICD-10-CM

## 2023-05-20 DIAGNOSIS — R140 Abdominal distension (gaseous): Secondary | ICD-10-CM | POA: Diagnosis not present

## 2023-05-20 MED ORDER — RABEPRAZOLE SODIUM 20 MG PO TBEC
20.0000 mg | DELAYED_RELEASE_TABLET | Freq: Two times a day (BID) | ORAL | 3 refills | Status: DC
Start: 2023-05-20 — End: 2023-08-20

## 2023-05-20 NOTE — Patient Instructions (Addendum)
 We will check you for sucrase deficiency to see if this is contributing to your bloating.   I would also like for you to follow a low fodmap diet.  As you have identified some foods that tend to be more problematic for you I recommend you cut those out first to see how you feel. This includes gluten, dairy, peppers, onions, salad.  Add Colace 100 mg daily.  You can increase this up to 300 mg daily if needed.  The goal is for you to have good, productive bowel movements every day.  If Colace is not helpful, you can try MiraLAX 17 g daily of water or other noncarbonated beverage of your choice.  Stop omeprazole and start Aciphex 20 mg twice daily before meals.   Avoid all NSAIDs as much as possible including diclofenac, ibuprofen, Aleve, Advil, BC powders, Goody powders, and anything that says "NSAID".    I will plan to see you back in 3 months or sooner if needed.    Ermalinda Memos, PA-C [facility]

## 2023-06-12 ENCOUNTER — Other Ambulatory Visit: Payer: Self-pay

## 2023-06-12 DIAGNOSIS — I872 Venous insufficiency (chronic) (peripheral): Secondary | ICD-10-CM

## 2023-06-12 DIAGNOSIS — I6523 Occlusion and stenosis of bilateral carotid arteries: Secondary | ICD-10-CM

## 2023-06-24 ENCOUNTER — Encounter: Payer: Self-pay | Admitting: Physician Assistant

## 2023-06-24 ENCOUNTER — Ambulatory Visit (HOSPITAL_COMMUNITY)
Admission: RE | Admit: 2023-06-24 | Discharge: 2023-06-24 | Disposition: A | Discharge status: Home / Self Care | Payer: Medicare Other | Source: Ambulatory Visit | Attending: Vascular Surgery | Admitting: Vascular Surgery

## 2023-06-24 ENCOUNTER — Telehealth: Payer: Self-pay

## 2023-06-24 ENCOUNTER — Ambulatory Visit (INDEPENDENT_AMBULATORY_CARE_PROVIDER_SITE_OTHER): Payer: Medicare Other | Admitting: Physician Assistant

## 2023-06-24 ENCOUNTER — Other Ambulatory Visit: Payer: Self-pay | Admitting: Vascular Surgery

## 2023-06-24 ENCOUNTER — Ambulatory Visit (INDEPENDENT_AMBULATORY_CARE_PROVIDER_SITE_OTHER)
Admission: RE | Admit: 2023-06-24 | Discharge: 2023-06-24 | Disposition: A | Discharge status: Home / Self Care | Payer: Medicare Other | Source: Ambulatory Visit | Attending: Vascular Surgery | Admitting: Vascular Surgery

## 2023-06-24 VITALS — BP 131/75 | HR 71 | Temp 98.0°F | Ht 61.0 in | Wt 188.5 lb

## 2023-06-24 DIAGNOSIS — I771 Stricture of artery: Secondary | ICD-10-CM

## 2023-06-24 DIAGNOSIS — I872 Venous insufficiency (chronic) (peripheral): Secondary | ICD-10-CM

## 2023-06-24 DIAGNOSIS — I6523 Occlusion and stenosis of bilateral carotid arteries: Secondary | ICD-10-CM | POA: Insufficient documentation

## 2023-06-24 LAB — VAS US ABI WITH/WO TBI
Left ABI: 1.28
Right ABI: 1.19

## 2023-06-24 NOTE — Progress Notes (Signed)
 Office Note   History of Present Illness   Carmen Morgan is a 68 y.o. ([birth year]) female who presents for surveillance of carotid artery stenosis.  She also has a history of bilateral subclavian artery stenosis.  She returns today for follow-up.  She denies any strokelike symptoms such as slurred speech, facial droop, sudden weakness/numbness, or amaurosis fugax.  She also denies any claudication, tissue loss, or rest pain in bilateral upper and lower extremities.  At her last office visit, we switched her from Lipitor to Crestor in hopes that she would no longer experience myalgias.  Unfortunately she still had severe muscle cramping and pain on Crestor so she stopped this 3 weeks ago.  Her leg cramping has now completely gone away.  She also reports struggling with restless legs nightly for years.  Current Outpatient Medications  Medication Sig Dispense Refill   acetaminophen (TYLENOL) 500 MG tablet Take 1,000 mg by mouth every 6 (six) hours as needed for moderate pain or headache.     amLODipine (NORVASC) 5 MG tablet Take 1 tablet (5 mg total) by mouth daily. 90 tablet 3   ascorbic acid (VITAMIN C) 250 MG CHEW Chew 250 mg by mouth daily.     aspirin 81 MG EC tablet Take 1 tablet (81 mg total) by mouth daily with breakfast. Swallow whole. 30 tablet 11   Cholecalciferol (VITAMIN D3) 50 MCG (2000 UT) TABS Take 2,000 Units by mouth daily.      estradiol (ESTRACE) 0.5 MG tablet Take 0.5 mg by mouth at bedtime.      fexofenadine (ALLEGRA) 180 MG tablet Take 180 mg by mouth daily.     fluticasone (FLONASE) 50 MCG/ACT nasal spray Place 2 sprays into both nostrils daily.      hydrochlorothiazide (HYDRODIURIL) 25 MG tablet TAKE 1 TABLET BY MOUTH EVERY DAY IN THE MORNING 90 tablet 2   isosorbide mononitrate (IMDUR) 30 MG 24 hr tablet TAKE 1 TABLET BY MOUTH EVERY DAY 90 tablet 2   lisinopril (ZESTRIL) 40 MG tablet Take 1 tablet (40 mg total) by mouth daily at 10 pm. 90 tablet 2   Melatonin 10 MG  CAPS Take 10 mg by mouth at bedtime.     montelukast (SINGULAIR) 10 MG tablet Take 1 tablet by mouth daily.     nitroGLYCERIN (NITROSTAT) 0.4 MG SL tablet Place 1 tablet (0.4 mg total) under the tongue every 5 (five) minutes as needed for chest pain. If you require more than two tablets five minutes apart go to the nearest ER via EMS. 30 tablet 0   Probiotic Product (PROBIOTIC DAILY PO) Take 1 tablet by mouth daily.     RABEprazole (ACIPHEX) 20 MG tablet Take 1 tablet (20 mg total) by mouth 2 (two) times daily before a meal. 60 tablet 3   rosuvastatin (CRESTOR) 10 MG tablet Take 1 tablet (10 mg total) by mouth daily. 30 tablet 6   No current facility-administered medications for this visit.    REVIEW OF SYSTEMS (negative unless checked):   Cardiac:  []  Chest pain or chest pressure? []  Shortness of breath upon activity? []  Shortness of breath when lying flat? []  Irregular heart rhythm?  Vascular:  []  Pain in calf, thigh, or hip brought on by walking? []  Pain in feet at night that wakes you up from your sleep? []  Blood clot in your veins? []  Leg swelling?  Pulmonary:  []  Oxygen at home? []  Productive cough? []  Wheezing?  Neurologic:  []  Sudden weakness  in arms or legs? []  Sudden numbness in arms or legs? []  Sudden onset of difficult speaking or slurred speech? []  Temporary loss of vision in one eye? []  Problems with dizziness?  Gastrointestinal:  []  Blood in stool? []  Vomited blood?  Genitourinary:  []  Burning when urinating? []  Blood in urine?  Psychiatric:  []  Major depression  Hematologic:  []  Bleeding problems? []  Problems with blood clotting?  Dermatologic:  []  Rashes or ulcers?  Constitutional:  []  Fever or chills?  Ear/Nose/Throat:  []  Change in hearing? []  Nose bleeds? []  Sore throat?  Musculoskeletal:  []  Back pain? []  Joint pain? []  Muscle pain?   Physical Examination   Vitals:   06/24/23 1433 06/24/23 1435  BP: (!) 159/80 131/75   Pulse: 71   Temp: 98 F (36.7 C)   TempSrc: Temporal   SpO2: 95%   Weight: 188 lb 8 oz (85.5 kg)   Height: 5\' 1"  (1.549 m)    Body mass index is 35.62 kg/m.  General:  WDWN in NAD; vital signs documented above Gait: Not observed HENT: WNL, normocephalic Pulmonary: normal non-labored breathing , without rales, rhonchi,  wheezing Cardiac: regular Abdomen: soft, NT, no masses Skin: without rashes Vascular Exam/Pulses: palpable radial and DP pulses bilaterally Extremities: without ischemic changes, without gangrene , without cellulitis; without open wounds;  Musculoskeletal: no muscle wasting or atrophy  Neurologic: A&O X 3;  No focal weakness or paresthesias are detected Psychiatric:  The pt has Normal affect.  Non-Invasive Vascular Imaging   Bilateral Carotid Duplex (06/24/2023):  R ICA stenosis:  40-59% R VA:  patent and antegrade L ICA stenosis:  40-59% L VA:  patent and antegrade  ABIs (06/24/2023) ABI/TBIToday's ABIToday's TBIPrevious ABIPrevious TBI  +-------+-----------+-----------+------------+------------+  Right 1.19       0.86                                 +-------+-----------+-----------+------------+------------+  Left  1.28       0.98                                 +-------+-----------+-----------+------------+------------+   Medical Decision Making   Carmen Morgan is a 68 y.o. female who presents for surveillance of carotid artery stenosis  Based on the patient's vascular studies, her carotid artery stenosis is unchanged bilaterally at 40 to 59% She denies any strokelike symptoms such as slurred speech, facial droop, amaurosis fugax, or sudden weakness/numbness Her ABIs are normal bilaterally.  Her right ABI is 1.19 and left ABI is 1.28.  She has no evidence of arterial insufficiency and has palpable pedal pulses bilaterally Unfortunately the patient had myalgias on Crestor and stopped her medication 3 weeks ago.  I have encouraged her to  discuss further medications with her PCP to control her cholesterol. She also reports several years of restless legs that bother her at night.  I am unsure of the cause of her restless legs, however I have reassured her that it is not due to arterial insufficiency given that she has palpable pedal pulses and normal ABIs.  She also has no other symptoms of arterial insufficiency such as claudication, rest pain, or tissue loss. She can follow-up with our office in 1 year with repeat carotid duplex   Loel Dubonnet PA-C [facility] Office: [phone]  Clinic MD: Randie Heinz

## 2023-06-24 NOTE — Telephone Encounter (Signed)
 Form has been faxed to [facility] for processing.

## 2023-06-24 NOTE — Telephone Encounter (Signed)
 [facility] pharmacy called stating that the pt completed the four day trial and reported a 90% improvement in diarrhea, bloating and abd cramping and is requesting a formal rx for Sucraid. Form is on desk to be signed.

## 2023-06-24 NOTE — Telephone Encounter (Signed)
 This is great! Form has been signed.

## 2023-07-09 ENCOUNTER — Encounter: Payer: Self-pay | Admitting: Radiology

## 2023-07-22 LAB — LAB REPORT - SCANNED
EGFR: 93
Free T4: 1.04

## 2023-07-23 ENCOUNTER — Encounter: Payer: Self-pay | Admitting: Cardiology

## 2023-08-05 ENCOUNTER — Telehealth: Payer: Self-pay | Admitting: Cardiology

## 2023-08-05 DIAGNOSIS — I2510 Atherosclerotic heart disease of native coronary artery without angina pectoris: Secondary | ICD-10-CM

## 2023-08-05 DIAGNOSIS — I209 Angina pectoris, unspecified: Secondary | ICD-10-CM

## 2023-08-05 DIAGNOSIS — I10 Essential (primary) hypertension: Secondary | ICD-10-CM

## 2023-08-05 DIAGNOSIS — I252 Old myocardial infarction: Secondary | ICD-10-CM

## 2023-08-05 MED ORDER — NITROGLYCERIN 0.4 MG SL SUBL: 0.4000 mg | SUBLINGUAL_TABLET | SUBLINGUAL | 7 refills | Status: AC | PRN

## 2023-08-05 NOTE — Telephone Encounter (Signed)
 Pt's medication was sent to pt's pharmacy as requested. Confirmation received.

## 2023-08-05 NOTE — Telephone Encounter (Signed)
 *  STAT* If patient is at the pharmacy, call can be transferred to refill team.   1. Which medications need to be refilled? (please list name of each medication and dose if known)   nitroGLYCERIN  (NITROSTAT ) 0.4 MG SL tablet      2. Would you like to learn more about the convenience, safety, & potential cost savings by using the [facility]? No      3. Are you open to using the [facility] (Type [facility]. ). No    4. Which pharmacy/location (including street and city if local pharmacy) is medication to be sent to? [id number] [place], [place] - [address]     5. Do they need a 30 day or 90 day supply? 1 bottle  Pt need it today, she will go out of town and her old bottle is expired

## 2023-08-18 ENCOUNTER — Other Ambulatory Visit: Payer: Self-pay | Admitting: Cardiology

## 2023-08-18 DIAGNOSIS — I2510 Atherosclerotic heart disease of native coronary artery without angina pectoris: Secondary | ICD-10-CM

## 2023-08-20 ENCOUNTER — Other Ambulatory Visit: Payer: Self-pay | Admitting: Gastroenterology

## 2023-08-20 DIAGNOSIS — R1013 Epigastric pain: Secondary | ICD-10-CM

## 2023-08-20 DIAGNOSIS — K219 Gastro-esophageal reflux disease without esophagitis: Secondary | ICD-10-CM

## 2023-10-07 ENCOUNTER — Ambulatory Visit: Admitting: Gastroenterology

## 2023-10-09 ENCOUNTER — Other Ambulatory Visit: Payer: Self-pay | Admitting: Cardiology

## 2023-10-09 DIAGNOSIS — I10 Essential (primary) hypertension: Secondary | ICD-10-CM

## 2023-10-12 NOTE — Progress Notes (Unsigned)
 Referring Provider: Blinda Milling, FNP Primary Care Physician:  Blinda Milling, FNP Primary GI Physician: Dr. Shaaron  No chief complaint on file.   HPI:   Carmen Morgan is a 68 y.o. female  with history of GERD, dysphagia improved with prior empiric esophageal dilation, chronic postprandial gas/bloating, previously with diarrhea, presenting today for follow-up for abdominal bloating and epigastric pain.   Last seen in the office 05/20/2023.  She continued with chronic postprandial gas and bloating.  Reported this had been an ongoing issue since undergoing hysterectomy 10 years ago.  Etiology was unclear.  It was noted that bowels did not always move productively, so recommended starting Colace daily.  Otherwise, recommended completing test for sucrase deficiency and patient was counseled on low FODMAP diet.  Recommended starting with a few items that she noted to be problematic for her including gluten, dairy, salads, onions, peppers.  If symptoms continued, strict low FODMAP diet will need to be implemented.  She also had intermittent epigastric burning sensation that had increased over the last couple of weeks, typical heartburn controlled with omeprazole  40 mg twice daily.  Plan to trial a different PPI, Aciphex  20 mg twice daily.  Ultimately, rather than completing sucrase breath test, 4-day trial of Sucraid was pursued with patient reporting 90% improvement in her bloating and cramping.  Formal prescription was sent.  Today:  Postprandial gas/bloating:  Epigastric pain/burning:  GERD:   *Prior celiac screen was normal in 2023. Fecal elastase wnl in 2024. No significant improvement with empiric SIBO treatment in October 2024. No improvement with Gas-X or Lactaid.   Past Medical History:  Diagnosis Date   Coronary atherosclerosis due to calcified coronary lesion    GERD (gastroesophageal reflux disease)    History of COVID-04 April 2018 and July 2022.   Hyperlipidemia     Hypertension    Nonobstructive atherosclerosis of coronary artery    Seasonal allergies     Past Surgical History:  Procedure Laterality Date   ABDOMINAL HYSTERECTOMY  2014   BACK SURGERY  2012   COLONOSCOPY N/A 02/16/2019   pancolonic diverticulosis   ESOPHAGOGASTRODUODENOSCOPY N/A 01/18/2020   Procedure: ESOPHAGOGASTRODUODENOSCOPY (EGD);  Surgeon: Shaaron Lamar HERO, MD;  Location: [facility];  Service: Endoscopy;  Laterality: N/A;  2:45pm   left breast cystectomy  1997   LEFT HEART CATH AND CORONARY ANGIOGRAPHY N/A 05/14/2021   Procedure: LEFT HEART CATH AND CORONARY ANGIOGRAPHY;  Surgeon: Elmira Newman PARAS, MD;  Location: [facility];  Service: Cardiovascular;  Laterality: N/A;   MALONEY DILATION N/A 01/18/2020   Procedure: AGAPITO DILATION;  Surgeon: Shaaron Lamar HERO, MD;  Location: [facility];  Service: Endoscopy;  Laterality: N/A;   URETHRAL SLING  07/2020   VEIN SURGERY  2013    Current Outpatient Medications  Medication Sig Dispense Refill   acetaminophen (TYLENOL) 500 MG tablet Take 1,000 mg by mouth every 6 (six) hours as needed for moderate pain or headache.     amLODipine  (NORVASC ) 5 MG tablet Take 1 tablet (5 mg total) by mouth daily. 90 tablet 3   ascorbic acid (VITAMIN C) 250 MG CHEW Chew 250 mg by mouth daily.     aspirin  81 MG EC tablet Take 1 tablet (81 mg total) by mouth daily with breakfast. Swallow whole. 30 tablet 11   Cholecalciferol (VITAMIN D3) 50 MCG (2000 UT) TABS Take 2,000 Units by mouth daily.      estradiol (ESTRACE) 0.5 MG tablet Take  0.5 mg by mouth at bedtime.      fexofenadine (ALLEGRA) 180 MG tablet Take 180 mg by mouth daily.     fluticasone (FLONASE) 50 MCG/ACT nasal spray Place 2 sprays into both nostrils daily.      hydrochlorothiazide  (HYDRODIURIL ) 25 MG tablet TAKE 1 TABLET BY MOUTH EVERY DAY IN THE MORNING 90 tablet 1   isosorbide  mononitrate (IMDUR ) 30 MG 24 hr tablet TAKE 1 TABLET BY MOUTH EVERY DAY 90 tablet 1   lisinopril   (ZESTRIL ) 40 MG tablet Take 1 tablet (40 mg total) by mouth daily at 10 pm. 90 tablet 2   Melatonin 10 MG CAPS Take 10 mg by mouth at bedtime.     montelukast (SINGULAIR) 10 MG tablet Take 1 tablet by mouth daily.     nitroGLYCERIN  (NITROSTAT ) 0.4 MG SL tablet Place 1 tablet (0.4 mg total) under the tongue every 5 (five) minutes as needed for chest pain. If you require more than two tablets five minutes apart go to the nearest ER via EMS. 30 tablet 7   Probiotic Product (PROBIOTIC DAILY PO) Take 1 tablet by mouth daily.     RABEprazole  (ACIPHEX ) 20 MG tablet TAKE 1 TABLET (20 MG TOTAL) BY MOUTH 2 (TWO) TIMES DAILY BEFORE A MEAL. 180 tablet 1   rosuvastatin  (CRESTOR ) 10 MG tablet Take 1 tablet (10 mg total) by mouth daily. 30 tablet 6   No current facility-administered medications for this visit.    Allergies as of 10/14/2023 - Review Complete 06/24/2023  Allergen Reaction Noted   Penicillins Hives 10/29/2018   Vitamin b12 Nausea And Vomiting 10/29/2018   Codeine Palpitations 10/29/2018   Vicks nyquil cough [doxylamine-dm] Palpitations 05/08/2021    Family History  Problem Relation Age of Onset   Diabetes Mother    Heart attack Mother    CAD Mother    Arthritis Mother    Cancer Father    Hypertension Father    COPD Sister    CAD Sister    Diabetes Sister    Testicular cancer Brother    Colon cancer Neg Hx     Social History   Socioeconomic History   Marital status: Married    Spouse name: Not on file   Number of children: 1   Years of education: Not on file   Highest education level: Not on file  Occupational History   Not on file  Tobacco Use   Smoking status: Former    Current packs/day: 0.00    Average packs/day: 0.5 packs/day for 40.0 years (20.0 ttl pk-yrs)    Types: Cigarettes    Start date: 04/25/1969    Quit date: 04/25/2009    Years since quitting: 14.4   Smokeless tobacco: Never  Vaping Use   Vaping status: Former  Substance and Sexual Activity   Alcohol  use: Never   Drug use: Never   Sexual activity: Yes  Other Topics Concern   Not on file  Social History Narrative   Not on file   Social Drivers of Health   Financial Resource Strain: Not on file  Food Insecurity: Not on file  Transportation Needs: Not on file  Physical Activity: Not on file  Stress: Not on file  Social Connections: Not on file    Review of Systems: Gen: Denies fever, chills, anorexia. Denies fatigue, weakness, weight loss.  CV: Denies chest pain, palpitations, syncope, peripheral edema, and claudication. Resp: Denies dyspnea at rest, cough, wheezing, coughing up blood, and pleurisy. GI: Denies vomiting  blood, jaundice, and fecal incontinence.   Denies dysphagia or odynophagia. Derm: Denies rash, itching, dry skin Psych: Denies depression, anxiety, memory loss, confusion. No homicidal or suicidal ideation.  Heme: Denies bruising, bleeding, and enlarged lymph nodes.  Physical Exam: There were no vitals taken for this visit. General:   Alert and oriented. No distress noted. Pleasant and cooperative.  Head:  Normocephalic and atraumatic. Eyes:  Conjuctiva clear without scleral icterus. Heart:  S1, S2 present without murmurs appreciated. Lungs:  Clear to auscultation bilaterally. No wheezes, rales, or rhonchi. No distress.  Abdomen:  +BS, soft, non-tender and non-distended. No rebound or guarding. No HSM or masses noted. Msk:  Symmetrical without gross deformities. Normal posture. Extremities:  Without edema. Neurologic:  Alert and  oriented x4 Psych:  Normal mood and affect.    Assessment:     Plan:  ***   Josette Centers, PA-C [facility] 10/14/2023

## 2023-10-14 ENCOUNTER — Ambulatory Visit (INDEPENDENT_AMBULATORY_CARE_PROVIDER_SITE_OTHER): Admitting: Gastroenterology

## 2023-10-14 ENCOUNTER — Encounter: Payer: Self-pay | Admitting: Gastroenterology

## 2023-10-14 VITALS — BP 137/72 | HR 85 | Temp 98.0°F | Ht 61.0 in | Wt 188.8 lb

## 2023-10-14 DIAGNOSIS — E7431 Sucrase-isomaltase deficiency: Secondary | ICD-10-CM

## 2023-10-14 DIAGNOSIS — K219 Gastro-esophageal reflux disease without esophagitis: Secondary | ICD-10-CM | POA: Diagnosis not present

## 2023-10-14 DIAGNOSIS — R140 Abdominal distension (gaseous): Secondary | ICD-10-CM | POA: Diagnosis not present

## 2023-10-14 DIAGNOSIS — E7439 Other disorders of intestinal carbohydrate absorption: Secondary | ICD-10-CM | POA: Diagnosis not present

## 2023-10-14 DIAGNOSIS — R197 Diarrhea, unspecified: Secondary | ICD-10-CM

## 2023-10-14 DIAGNOSIS — K529 Noninfective gastroenteritis and colitis, unspecified: Secondary | ICD-10-CM | POA: Diagnosis not present

## 2023-10-14 MED ORDER — HYOSCYAMINE SULFATE 0.125 MG SL SUBL
0.1250 mg | SUBLINGUAL_TABLET | Freq: Four times a day (QID) | SUBLINGUAL | 3 refills | Status: AC | PRN
Start: 2023-10-14 — End: ?

## 2023-10-14 NOTE — Patient Instructions (Addendum)
 Continue taking Sucraid.   As we discussed, I suspect that your intermittent diarrhea may be related to IBS. I am prescribing Levsin  0.125 mg sublingual tablet for you to use every 6 hours as needed.  You can take the medication when you start to feel abdominal pain or develop diarrhea.  Continue taking Aciphex  as needed for GERD.  We will see you back in the office in 3 months or sooner if needed.  Josette Centers, PA-C [facility]

## 2023-11-17 ENCOUNTER — Other Ambulatory Visit: Payer: Self-pay | Admitting: Cardiology

## 2023-11-17 DIAGNOSIS — I10 Essential (primary) hypertension: Secondary | ICD-10-CM

## 2023-11-18 MED ORDER — LISINOPRIL 40 MG PO TABS: 40.0000 mg | ORAL_TABLET | Freq: Every day | ORAL | 1 refills | Status: AC

## 2023-11-26 ENCOUNTER — Other Ambulatory Visit: Payer: Self-pay | Admitting: Surgery

## 2023-11-26 DIAGNOSIS — R200 Anesthesia of skin: Secondary | ICD-10-CM

## 2023-12-01 ENCOUNTER — Encounter: Payer: Self-pay | Admitting: Internal Medicine

## 2024-01-27 ENCOUNTER — Telehealth: Payer: Self-pay | Admitting: Internal Medicine

## 2024-01-27 NOTE — Telephone Encounter (Signed)
 Patient called and asked that we sent the MRI report of cervical spine to [facility] Dr. Sulaiman Tijani.... sent 01/27/24

## 2024-02-13 ENCOUNTER — Other Ambulatory Visit: Payer: Self-pay | Admitting: Gastroenterology

## 2024-02-13 DIAGNOSIS — K219 Gastro-esophageal reflux disease without esophagitis: Secondary | ICD-10-CM

## 2024-02-13 DIAGNOSIS — R1013 Epigastric pain: Secondary | ICD-10-CM

## 2024-02-17 ENCOUNTER — Other Ambulatory Visit: Payer: Self-pay | Admitting: Cardiology

## 2024-02-17 ENCOUNTER — Ambulatory Visit: Attending: Cardiology | Admitting: Cardiology

## 2024-02-17 ENCOUNTER — Encounter: Payer: Self-pay | Admitting: Cardiology

## 2024-02-17 VITALS — BP 142/77 | HR 76 | Resp 16 | Ht 61.0 in | Wt 182.8 lb

## 2024-02-17 DIAGNOSIS — I25118 Atherosclerotic heart disease of native coronary artery with other forms of angina pectoris: Secondary | ICD-10-CM | POA: Diagnosis not present

## 2024-02-17 DIAGNOSIS — I2089 Other forms of angina pectoris: Secondary | ICD-10-CM | POA: Insufficient documentation

## 2024-02-17 DIAGNOSIS — I10 Essential (primary) hypertension: Secondary | ICD-10-CM | POA: Insufficient documentation

## 2024-02-17 DIAGNOSIS — E782 Mixed hyperlipidemia: Secondary | ICD-10-CM | POA: Insufficient documentation

## 2024-02-17 DIAGNOSIS — I2584 Coronary atherosclerosis due to calcified coronary lesion: Secondary | ICD-10-CM | POA: Diagnosis not present

## 2024-02-17 DIAGNOSIS — I700 Atherosclerosis of aorta: Secondary | ICD-10-CM | POA: Diagnosis present

## 2024-02-17 DIAGNOSIS — Z789 Other specified health status: Secondary | ICD-10-CM | POA: Diagnosis present

## 2024-02-17 DIAGNOSIS — I6523 Occlusion and stenosis of bilateral carotid arteries: Secondary | ICD-10-CM | POA: Diagnosis present

## 2024-02-17 DIAGNOSIS — Z79899 Other long term (current) drug therapy: Secondary | ICD-10-CM

## 2024-02-17 DIAGNOSIS — I771 Stricture of artery: Secondary | ICD-10-CM | POA: Diagnosis present

## 2024-02-17 DIAGNOSIS — I2510 Atherosclerotic heart disease of native coronary artery without angina pectoris: Secondary | ICD-10-CM | POA: Insufficient documentation

## 2024-02-17 MED ORDER — EZETIMIBE 10 MG PO TABS: 10.0000 mg | ORAL_TABLET | Freq: Every day | ORAL | 3 refills | Status: AC

## 2024-02-17 NOTE — Patient Instructions (Signed)
 Medication Instructions:  Start: Ezetimibe (Zetia) 10 mg, one tablet, once daily  Lab Work: In six Weeks (due 03/30/2024) FASTING (ok to have water  and/or black coffee) Lipid Panel, CMP, Lipoprotein a; can go to any [facility], no appointment needed.    Follow-Up: At [facility], you and your health needs are our priority.  As part of our continuing mission to provide you with exceptional heart care, our providers are all part of one team.  This team includes your primary Cardiologist (physician) and Advanced Practice Providers or APPs (Physician Assistants and Nurse Practitioners) who all work together to provide you with the care you need, when you need it.  Your next appointment:   1 year(s)  Provider:   Madonna Large, DO   Other Instructions Please call us  or send a MyChart message with any Cardiology related questions/concerns.  [phone].  Thank you!

## 2024-02-17 NOTE — Progress Notes (Signed)
 Cardiology Office Note:  .   ID:  Carmen Morgan, DOB June 21, 1955, MRN [id number] PCP:  Blinda Milling, FNP  Former Cardiology Providers: N/A [facility] Providers Cardiologist:  Madonna Large, DO , [facility] (established care 10/14/2020) Electrophysiologist:  None  Click to update primary MD,subspecialty MD or APP then REFRESH:1}    Chief Complaint  Patient presents with   Follow-up    CAD and HLD    History of Present Illness: Carmen Morgan is a 68 y.o. Caucasian female whose past medical history and cardiovascular risk factors includes: Carotid artery disease, subclavian artery disease, mild nonobstructive CAD, mild coronary artery calcification, history of COVID-19 infection (January 2020 and July 2022), hypertension, on hormone replacement therapy, former smoker, postmenopausal female, advanced age, obesity due to excess calories.   Presented to the ED in July 2022 with NSTEMI and underwent coronary CTA which noted mild CAC and mild nonobstructive CAD.  She subsequently underwent left heart catheterization was found to have no significant disease.  Since then she has been on medical therapy for microvascular disease/dysfunction.    Patient did follow-up with vascular surgery in April 2025 progress note reviewed as well as carotid duplex and ABI results.  Over the past month she has been in the [employer] program and has lost 10 pounds. She has restricted snacks, bread, and pasta and shifted to leaner foods and healthier oils. She drinks about 64 to 85 ounces of water  daily. Recent blood work showed marked improvement compared with prior results.  She stopped her cholesterol medication over a year ago due to severe cramps. She been experiencing statin intolerance despite transitioning from atorvastatin  to Crestor . Recent labs off therapy showed improved cholesterol with LDL 75 and triglycerides 171, down from higher prior values.  No nitro since last office  visit. Home blood pressures are ranging around 130 mmHg.  Review of Systems: .   Review of Systems  Cardiovascular:  Negative for chest pain, claudication, irregular heartbeat, leg swelling, near-syncope, orthopnea, palpitations, paroxysmal nocturnal dyspnea and syncope.  Respiratory:  Negative for shortness of breath.   Hematologic/Lymphatic: Negative for bleeding problem.    Studies Reviewed:   EKG: EKG Interpretation Date/Time:  Wednesday February 17 2024 13:08:29 EST Ventricular Rate:  78 PR Interval:  144 QRS Duration:  136 QT Interval:  412 QTC Calculation: 469 R Axis:   73  Text Interpretation: Normal sinus rhythm Non-specific intra-ventricular conduction block Cannot rule out Anteroseptal infarct , age undetermined When compared with ECG of 16-Mar-2023 11:01, No significant change was found Confirmed by Large Madonna [phone]) on 02/17/2024 1:18:01 PM  Echocardiogram: 10/14/2020: LVEF 65-70%, no regional wall motion abnormalities, mild LVH, trivial MR.   Stress test: None   Heart catheterization: 05/14/2021: LM: Normal LAD: No significant disease Lcx: Mid 20% disease RCA: Prox 20% disease   LVEDP 26 mmHg No obstructive CAD   Recommend Imdur  30 mg for possible microvascular disease   Carotid Duplex 10/15/2020:  Right ICA 60 to 79% stenosis Left ICA: 40-59% stenosis  Right vertebral demonstrates antegrade flow. Left vertebral demonstrates retrograde flow. Subclavians: Left subclavian artery was stenotic. Normal flow hemodynamics were seen in the right subclavian artery.   06/24/2023 Right ICA 40-59% Left ICA 40-59% Right vertebral artery antegrade flow and left vertebral artery retrograde flow Bilateral subclavian arteries are stenotic left greater than right  Ankle-brachial index: April 2025: Right ABI within normal limits, right TBI normal Left ABI normal and left TBI normal   Coronary  CTA 10/15/2020: 1. Total coronary calcium  score of 14.1. This was 60th  percentile for age and sex matched control. 2. Normal coronary origin with right dominance. 3. CAD-RADS = 2. Minimal stenosis (0-24%) due to noncalcified plaque in the proximal LAD. Mild stenosis (25-49%) within the mid LCx due to mixed plaque. Minimal stenosis (0-24%) due to mixed plaque in proximal RCA. 4. Aortic atherosclerosis within descending aorta.  RADIOLOGY: NA  Risk Assessment/Calculations:   NA   Labs:    External Labs: Collected: November 2024 provided by the patient. A1c 6.5 Total cholesterol 134, triglycerides 77, HDL 65, LDL 54 BUN 28, creatinine 9.28. AST, ALT, alkaline phosphatase within normal limits  External Labs: Collected: Jul 22, 2023. BUN 19, creatinine 0.69. AST, ALT, alkaline phosphatase within normal limits Total cholesterol 178, triglycerides 171, HDL 62, LDL 92  External Labs: Collected: 02/03/2024 provided by the patient. Hemoglobin 12.7 g/dL. TSH 1.84. Total cholesterol 161, triglycerides 136, HDL 59, LDL 75 BUN 18, creatinine 9.27. eGFR 90. Sodium 142, potassium 4.8, chloride 104, bicarb 28  Physical Exam:    Today's Vitals   02/17/24 1306  BP: (!) 142/77  Pulse: 76  Resp: 16  SpO2: 95%  Weight: 182 lb 12.8 oz (82.9 kg)  Height: 5' 1 (1.549 m)    Body mass index is 34.54 kg/m. Wt Readings from Last 3 Encounters:  02/17/24 182 lb 12.8 oz (82.9 kg)  10/14/23 188 lb 12.8 oz (85.6 kg)  06/24/23 188 lb 8 oz (85.5 kg)    Physical Exam  Constitutional: No distress.  hemodynamically stable  Neck: No JVD present.  Cardiovascular: Normal rate, regular rhythm, S1 normal and S2 normal. Exam reveals no gallop, no S3 and no S4.  No murmur heard. Pulses:      Carotid pulses are  on the right side with bruit and  on the left side with bruit. Bilateral subclavian bruit  Pulmonary/Chest: Effort normal and breath sounds normal. No stridor. She has no wheezes. She has no rales.  Abdominal: Soft. Bowel sounds are normal. She exhibits no  distension. There is no abdominal tenderness.  Musculoskeletal:        General: No edema.     Cervical back: Neck supple.  Neurological: She is alert and oriented to person, place, and time. She has intact cranial nerves (2-12).  Skin: Skin is warm and moist.    Impression & Recommendation(s):  Impression:   ICD-10-CM   1. Coronary atherosclerosis due to calcified coronary lesion  I25.10 EKG 12-Lead   I25.84 Lipid panel    Comprehensive metabolic panel with GFR    Lipoprotein A (LPA)    Lipoprotein A (LPA)    Comprehensive metabolic panel with GFR    Lipid panel    2. Nonobstructive atherosclerosis of coronary artery  I25.10     3. Microvascular angina  I20.89     4. Benign hypertension  I10     5. Bilateral carotid artery stenosis  I65.23     6. Stenosis of left subclavian artery  I77.1     7. Aortic atherosclerosis  I70.0     8. Mixed hyperlipidemia  E78.2 ezetimibe  (ZETIA ) 10 MG tablet    Lipid panel    Comprehensive metabolic panel with GFR    Lipoprotein A (LPA)    Lipoprotein A (LPA)    Comprehensive metabolic panel with GFR    Lipid panel    9. Statin intolerance  Z78.9 ezetimibe  (ZETIA ) 10 MG tablet    Lipid panel  Comprehensive metabolic panel with GFR    Lipoprotein A (LPA)    Lipoprotein A (LPA)    Comprehensive metabolic panel with GFR    Lipid panel        Recommendation(s):  Microvascular angina (HCC) Coronary atherosclerosis due to calcified coronary lesion Aortic atherosclerosis (HCC) Chronic and stable. Echo: LVEF 65 to 70%, no significant valvular heart disease. Coronary CTA: Mild CAC (total CAC 14.1 AU, 60th percentile) mild nonobstructive CAD. LHC: no significant epicardial coronary disease. Remains free of angina pectoris. No use of sublingual nitroglycerin  tablets since the last office visit. Continue amlodipine  5 mg p.o. daily. Continue Imdur  30 mg p.o. daily Sublingual nitroglycerin  tablets to use on a as needed basis. Continue  antiplatelet We discussed lipid-lowering agents, see below No additional testing warranted at this time and asymptomatic female. We emphasized the importance of improving her modifiable cardiovascular risk factors.  Benign hypertension Office blood pressures are acceptable, home blood pressures are better controlled. Given her carotid and subclavian disease we will continue current medical therapy.   Continue amlodipine  and Imdur  as discussed above as well as hydrochlorothiazide .  Bilateral carotid artery stenosis/subclavian artery stenosis Recommended that she continue to follow-up annually as recommended with vascular surgery. No symptoms of TIA/stroke or overhead activities. Has been intolerant to statin therapy in the past. Lipids have improved with weight loss, congratulated on her efforts. But would benefit from further lowering of LDL levels, see recommendations below.  Mixed hyperlipidemia. Statin intolerance Has tried both Crestor  and atorvastatin  and has reported muscle aches. Lipids have improved with the weight loss, congratulated on her efforts Given her vascular disease would recommend further optimizing her LDL levels. Started Zetia  10 mg p.o. daily. Follow-up labs in 6 weeks Independently reviewed labs provided by the patient from November 2025, stated above for reference Recommend goal LDL at least less than 70 mg/dL  Orders Placed:  Orders Placed This Encounter  Procedures   Lipid panel    Standing Status:   Future    Number of Occurrences:   1    Expected Date:   03/30/2024    Expiration Date:   02/16/2025    Has the patient fasted?:   Yes   Comprehensive metabolic panel with GFR    Standing Status:   Future    Number of Occurrences:   1    Expected Date:   03/30/2024    Expiration Date:   02/16/2025    Has the patient fasted?:   Yes   Lipoprotein A (LPA)    Standing Status:   Future    Number of Occurrences:   1    Expected Date:   03/30/2024    Expiration  Date:   02/16/2025   EKG 12-Lead   Discussed management of at least two chronic comorbid conditions.  I have independently interpreted results of the EKG 02/17/2024, labs dated 01/2024 , and reviewed the results under studies reviewed as part of medical decision making.  I have reviewed most recent vascular surgery progress note from April 2025  I have provided patient education at today's visit.  Prescription drug management including but not limited to: addition/titration/discontinuation of medical therapy, medication reconciliation, discussed potential side effects, follow up labs either reviewed or ordered for monitoring safety.  Labs ordered - see above.  I have independently formulated and discussed the assessment and plan as outline above with Ms.Carmen Morgan and her husband at today's visit This note will be shared with the patient's primary care provider  to coordinate care.  Ms.Carmen Morgan will continue to follow her primary care provider for management of her other comorbid conditions.  Final Medication List:    Meds ordered this encounter  Medications   ezetimibe  (ZETIA ) 10 MG tablet    Sig: Take 1 tablet (10 mg total) by mouth daily.    Dispense:  90 tablet    Refill:  3    There are no discontinued medications.   Current Outpatient Medications:    acetaminophen (TYLENOL) 500 MG tablet, Take 1,000 mg by mouth every 6 (six) hours as needed for moderate pain or headache., Disp: , Rfl:    amLODipine  (NORVASC ) 5 MG tablet, Take 1 tablet (5 mg total) by mouth daily., Disp: 90 tablet, Rfl: 3   ascorbic acid (VITAMIN C) 250 MG CHEW, Chew 250 mg by mouth daily., Disp: , Rfl:    aspirin  81 MG EC tablet, Take 1 tablet (81 mg total) by mouth daily with breakfast. Swallow whole., Disp: 30 tablet, Rfl: 11   Cholecalciferol (VITAMIN D3) 50 MCG (2000 UT) TABS, Take 2,000 Units by mouth daily. , Disp: , Rfl:    estradiol (ESTRACE) 0.5 MG tablet, Take 0.5 mg by mouth at bedtime. ,  Disp: , Rfl:    ezetimibe  (ZETIA ) 10 MG tablet, Take 1 tablet (10 mg total) by mouth daily., Disp: 90 tablet, Rfl: 3   fexofenadine (ALLEGRA) 180 MG tablet, Take 180 mg by mouth daily., Disp: , Rfl:    fluticasone (FLONASE) 50 MCG/ACT nasal spray, Place 2 sprays into both nostrils daily. , Disp: , Rfl:    hydrochlorothiazide  (HYDRODIURIL ) 25 MG tablet, TAKE 1 TABLET BY MOUTH EVERY DAY IN THE MORNING, Disp: 90 tablet, Rfl: 1   hyoscyamine  (LEVSIN  SL) 0.125 MG SL tablet, Place 1 tablet (0.125 mg total) under the tongue every 6 (six) hours as needed., Disp: 60 tablet, Rfl: 3   isosorbide  mononitrate (IMDUR ) 30 MG 24 hr tablet, TAKE 1 TABLET BY MOUTH EVERY DAY, Disp: 90 tablet, Rfl: 1   lisinopril  (ZESTRIL ) 40 MG tablet, Take 1 tablet (40 mg total) by mouth daily at 10 pm., Disp: 90 tablet, Rfl: 1   Melatonin 10 MG CAPS, Take 10 mg by mouth at bedtime., Disp: , Rfl:    montelukast (SINGULAIR) 10 MG tablet, Take 1 tablet by mouth daily., Disp: , Rfl:    nitroGLYCERIN  (NITROSTAT ) 0.4 MG SL tablet, Place 1 tablet (0.4 mg total) under the tongue every 5 (five) minutes as needed for chest pain. If you require more than two tablets five minutes apart go to the nearest ER via EMS., Disp: 30 tablet, Rfl: 7   Probiotic Product (PROBIOTIC DAILY PO), Take 1 tablet by mouth daily., Disp: , Rfl:    RABEprazole  (ACIPHEX ) 20 MG tablet, TAKE 1 TABLET (20 MG TOTAL) BY MOUTH 2 (TWO) TIMES DAILY BEFORE A MEAL. (Patient taking differently: Take 20 mg by mouth as needed. 1-2 times a week), Disp: 180 tablet, Rfl: 1  Consent:   N/A  Disposition:   1 year follow-up sooner if needed  Her questions and concerns were addressed to her satisfaction. She voices understanding of the recommendations provided during this encounter.    Signed, Madonna Michele HAS, [facility]  A Division of [facility] [address], [place], [place] [address]

## 2024-02-21 ENCOUNTER — Encounter: Payer: Self-pay | Admitting: Cardiology

## 2024-02-23 ENCOUNTER — Ambulatory Visit: Admitting: Internal Medicine

## 2024-02-23 ENCOUNTER — Encounter: Payer: Self-pay | Admitting: Internal Medicine

## 2024-02-23 VITALS — BP 141/69 | HR 78 | Temp 97.8°F | Ht 61.0 in | Wt 182.0 lb

## 2024-02-23 DIAGNOSIS — K219 Gastro-esophageal reflux disease without esophagitis: Secondary | ICD-10-CM

## 2024-02-23 MED ORDER — ISOSORBIDE MONONITRATE ER 30 MG PO TB24: 30.0000 mg | ORAL_TABLET | Freq: Every day | ORAL | 3 refills | Status: AC

## 2024-02-23 NOTE — Progress Notes (Unsigned)
 Gastroenterology Progress Note    Primary Care Physician:  Blinda Milling, FNP Primary Gastroenterologist:  Dr. Shaaron  Pre-Procedure History & Physical: HPI:  Carmen Morgan is a 68 y.o. female here for follow-up of diarrhea GERD.  Seen earlier this year.  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  Wanted to avoid formal testing.  She went to see [employer] which is a nutritional outfit would put her on a diet she has lost 8 pounds her diarrhea has gotten better she stopped taking Sucraid does not feel she needs it.  Only takes her PPI on occasion 1-2 times weekly after she gets reflux.  States she has had trouble for years feeling there is something caught in her throat.  She does take nasal steroids and antihistamines on a regular basis.  Has not seen ENT in a long time.  Prior EGD with esophageal dilation demonstrated a patent upper GI tract.  Does not really recall if it helped much.  Past Medical History:  Diagnosis Date   Coronary atherosclerosis due to calcified coronary lesion    GERD (gastroesophageal reflux disease)    History of COVID-04 April 2018 and July 2022.   Hyperlipidemia    Hypertension    Nonobstructive atherosclerosis of coronary artery    Seasonal allergies     Past Surgical History:  Procedure Laterality Date   ABDOMINAL HYSTERECTOMY  2014   BACK SURGERY  2012   COLONOSCOPY N/A 02/16/2019   pancolonic diverticulosis   ESOPHAGOGASTRODUODENOSCOPY N/A 01/18/2020   Procedure: ESOPHAGOGASTRODUODENOSCOPY (EGD);  Surgeon: Shaaron Lamar HERO, MD;  Location: [facility];  Service: Endoscopy;  Laterality: N/A;  2:45pm   left breast cystectomy  1997   LEFT HEART CATH AND CORONARY ANGIOGRAPHY N/A 05/14/2021   Procedure: LEFT HEART CATH AND CORONARY ANGIOGRAPHY;  Surgeon: Elmira Newman PARAS, MD;  Location: [facility] INVASIVE CV LAB;  Service: Cardiovascular;  Laterality: N/A;   MALONEY DILATION N/A 01/18/2020    Procedure: AGAPITO DILATION;  Surgeon: Shaaron Lamar HERO, MD;  Location: [facility];  Service: Endoscopy;  Laterality: N/A;   URETHRAL SLING  07/2020   VEIN SURGERY  2013    Prior to Admission medications   Medication Sig Start Date End Date Taking? Authorizing Provider  acetaminophen (TYLENOL) 500 MG tablet Take 1,000 mg by mouth every 6 (six) hours as needed for moderate pain or headache.   Yes [provider]  amLODipine  (NORVASC ) 5 MG tablet Take 1 tablet (5 mg total) by mouth daily. 05/01/23  Yes Tolia, Sunit, DO  ascorbic acid (VITAMIN C) 250 MG CHEW Chew 250 mg by mouth daily.   Yes [provider]  aspirin  81 MG EC tablet Take 1 tablet (81 mg total) by mouth daily with breakfast. Swallow whole. 10/17/20  Yes Regalado, Belkys A, MD  Cholecalciferol (VITAMIN D3) 50 MCG (2000 UT) TABS Take 2,000 Units by mouth daily.    Yes [provider]  estradiol (ESTRACE) 0.5 MG tablet Take 0.5 mg by mouth at bedtime.    Yes [provider]  ezetimibe  (ZETIA ) 10 MG tablet Take 1 tablet (10 mg total) by mouth daily. 02/17/24 05/17/24 Yes Tolia, Sunit, DO  fexofenadine (ALLEGRA) 180 MG tablet Take 180 mg by mouth daily.   Yes [provider]  fluticasone (FLONASE) 50 MCG/ACT nasal spray Place 2 sprays into both nostrils daily.    Yes [provider]  hydrochlorothiazide  (HYDRODIURIL ) 25 MG  tablet TAKE 1 TABLET BY MOUTH EVERY DAY IN THE MORNING 10/09/23  Yes Tolia, Sunit, DO  hyoscyamine  (LEVSIN  SL) 0.125 MG SL tablet Place 1 tablet (0.125 mg total) under the tongue every 6 (six) hours as needed. 10/14/23  Yes Rudy Neptune S, PA-C  isosorbide  mononitrate (IMDUR ) 30 MG 24 hr tablet Take 1 tablet (30 mg total) by mouth daily. 02/23/24  Yes Tolia, Sunit, DO  lisinopril  (ZESTRIL ) 40 MG tablet Take 1 tablet (40 mg total) by mouth daily at 10 pm. 11/18/23  Yes Tolia, Sunit, DO  Melatonin 10 MG CAPS Take 10 mg by mouth at bedtime.   Yes [provider]   montelukast (SINGULAIR) 10 MG tablet Take 1 tablet by mouth daily.   Yes [provider]  nitroGLYCERIN  (NITROSTAT ) 0.4 MG SL tablet Place 1 tablet (0.4 mg total) under the tongue every 5 (five) minutes as needed for chest pain. If you require more than two tablets five minutes apart go to the nearest ER via EMS. 08/05/23  Yes Tolia, Sunit, DO  Probiotic Product (PROBIOTIC DAILY PO) Take 1 tablet by mouth daily.   Yes [provider]  RABEprazole  (ACIPHEX ) 20 MG tablet TAKE 1 TABLET (20 MG TOTAL) BY MOUTH 2 (TWO) TIMES DAILY BEFORE A MEAL. Patient taking differently: Take 20 mg by mouth as needed. 1-2 times a week 08/20/23  Yes Harper, Neptune S, PA-C    Allergies as of 02/23/2024 - Review Complete 02/23/2024  Allergen Reaction Noted   Atorvastatin   02/23/2024   Penicillins Hives 10/29/2018   Rosuvastatin   02/23/2024   Vitamin b12 Nausea And Vomiting 10/29/2018   Codeine Palpitations 10/29/2018   Vicks nyquil cough [doxylamine-dm] Palpitations 05/08/2021    Family History  Problem Relation Age of Onset   Diabetes Mother    Heart attack Mother    CAD Mother    Arthritis Mother    Cancer Father    Hypertension Father    COPD Sister    CAD Sister    Diabetes Sister    Testicular cancer Brother    Colon cancer Neg Hx     Social History   Socioeconomic History   Marital status: Married    Spouse name: Not on file   Number of children: 1   Years of education: Not on file   Highest education level: Not on file  Occupational History   Not on file  Tobacco Use   Smoking status: Former    Current packs/day: 0.00    Average packs/day: 0.5 packs/day for 40.0 years (20.0 ttl pk-yrs)    Types: Cigarettes    Start date: 04/25/1969    Quit date: 04/25/2009    Years since quitting: 14.8   Smokeless tobacco: Never  Vaping Use   Vaping status: Former  Substance and Sexual Activity   Alcohol use: Never   Drug use: Never   Sexual activity: Yes  Other Topics Concern    Not on file  Social History Narrative   Not on file   Social Drivers of Health   Financial Resource Strain: Not on file  Food Insecurity: Not on file  Transportation Needs: Not on file  Physical Activity: Not on file  Stress: Not on file  Social Connections: Not on file  Intimate Partner Violence: Not on file    Review of Systems   See HPI, otherwise negative ROS  Physical Exam: BP (!) 141/69   Pulse 78   Temp 97.8 F (36.6 C) (Oral)   Ht  5' 1 (1.549 m)   Wt 182 lb (82.6 kg)   SpO2 97%   BMI 34.39 kg/m  General:   Alert,  Well-developed, well-nourished, pleasant and cooperative in NAD Skin:  Intact without significant lesions or rashes. Eyes:  Sclera clear, no icterus.   Conjunctiva pink. Ears:  Normal auditory acuity. Nose:  No deformity, discharge,  or lesions. Mouth:  No deformity or lesions. Neck:  Supple; no masses or thyromegaly. No significant cervical adenopathy. Lungs:  Clear throughout to auscultation.   No wheezes, crackles, or rhonchi. No acute distress. Heart:  Regular rate and rhythm; no murmurs, clicks, rubs,  or gallops. Abdomen: Non-distended, normal bowel sounds.  Soft and nontender without appreciable mass or hepatosplenomegaly.  Pulses:  Normal pulses noted. Extremities:  Without clubbing or edema.   Impression/Plan:     you likely do not need Sucraid anymore and you should stop it.  I recommend you take your Aciphex  or rabeprazole  20 mg once daily 30 minutes before breakfast every day to prevent episodes of reflux in the future.  Glad you are doing much better as far as bowel function is concerned with recommendations from [facility].  I would keep up the good work there with their recommended diet.  Patient have ongoing swallowing issues we will do a modified barium swallow to further evaluate.  If that is okay, I recommend you go back to ENT again to other causes of swallowing difficulties.  Some of your swallowing  difficulties could be due to silent reflux.  That is 1 reason of asking to go back on rabeprazole  20 mg every day.  Office visit in 3 months  Further recommendations to follow after the swallowing test results are available for review  Notice: This dictation was prepared with Dragon dictation along with smaller phrase technology. Any transcriptional errors that result from this process are unintentional and may not be corrected upon review.

## 2024-02-23 NOTE — Patient Instructions (Addendum)
 It was nice to see you again today  I agree, you likely do not need Sucraid anymore and you should stop it.  I recommend you take your Aciphex  or rabeprazole  20 mg once daily 30 minutes before breakfast every day to prevent episodes of reflux in the future.  Glad you are doing much better as far as bowel function is concerned with recommendations from [facility].  I would keep up the good work there with their recommended diet.  Patient have ongoing swallowing issues we will do a modified barium swallow to further evaluate.  If that is okay, I recommend you go back to ENT again to other causes of swallowing difficulties.  Some of your swallowing difficulties could be due to silent reflux.  That is 1 reason of asking to go back on rabeprazole  20 mg every day.  Office visit in 3 months  Further recommendations to follow after the swallowing test results are available for review

## 2024-02-25 ENCOUNTER — Other Ambulatory Visit: Payer: Self-pay | Admitting: *Deleted

## 2024-02-25 DIAGNOSIS — R1310 Dysphagia, unspecified: Secondary | ICD-10-CM

## 2024-02-25 DIAGNOSIS — K219 Gastro-esophageal reflux disease without esophagitis: Secondary | ICD-10-CM

## 2024-03-21 ENCOUNTER — Other Ambulatory Visit (HOSPITAL_COMMUNITY): Payer: Self-pay | Admitting: Occupational Therapy

## 2024-03-21 DIAGNOSIS — R1312 Dysphagia, oropharyngeal phase: Secondary | ICD-10-CM

## 2024-03-21 DIAGNOSIS — K219 Gastro-esophageal reflux disease without esophagitis: Secondary | ICD-10-CM

## 2024-03-23 ENCOUNTER — Other Ambulatory Visit: Payer: Self-pay | Admitting: Neurosurgery

## 2024-03-23 DIAGNOSIS — D329 Benign neoplasm of meninges, unspecified: Secondary | ICD-10-CM

## 2024-03-23 DIAGNOSIS — M4712 Other spondylosis with myelopathy, cervical region: Secondary | ICD-10-CM

## 2024-03-30 ENCOUNTER — Encounter (HOSPITAL_COMMUNITY): Payer: Self-pay | Admitting: Speech Pathology

## 2024-03-30 ENCOUNTER — Ambulatory Visit (HOSPITAL_COMMUNITY): Attending: Internal Medicine | Admitting: Speech Pathology

## 2024-03-30 ENCOUNTER — Other Ambulatory Visit: Payer: Self-pay

## 2024-03-30 ENCOUNTER — Ambulatory Visit (HOSPITAL_COMMUNITY)
Admission: RE | Admit: 2024-03-30 | Discharge: 2024-03-30 | Disposition: A | Discharge status: Home / Self Care | Source: Ambulatory Visit | Attending: Internal Medicine | Admitting: Internal Medicine

## 2024-03-30 DIAGNOSIS — R1312 Dysphagia, oropharyngeal phase: Secondary | ICD-10-CM | POA: Insufficient documentation

## 2024-03-30 DIAGNOSIS — K219 Gastro-esophageal reflux disease without esophagitis: Secondary | ICD-10-CM | POA: Insufficient documentation

## 2024-03-30 NOTE — Therapy (Signed)
 Modified Barium Swallow Study  Patient Details  Name: Carmen Morgan MRN: [id number] Date of Birth: Sep 28, 1955  Today's Date: 03/30/2024  Modified Barium Swallow completed.  Full report located under Chart Review in the Imaging Section.  History of Present Illness Pt referred for MBS secondary to c/o globus sensation intermittently during meals to assess swallow function.  Hx of GERD, being treated with medication (omeprazole ) per pt report.   Clinical Impression Pt exhibits a normal oropharyngeal swallow c/b brisk tongue motion with complete oral clearance, timely and efficient swallow response, complete laryngeal elevation, anterior hyoid excursion and epiglottic inflection.  Laryngeal vestibule closure adequate and no aspiration and/or penetration noted with any consistency.  Pharyngeal and esophageal clearance complete.  Normal study.  Provided pt with esophageal precautions to assist with meals d/t hx of GERD.  No further recommendations for ST required.  Continue current diet of Regular/thin liquids.  Thank you for this consult. Factors that may increase risk of adverse event in presence of aspiration [employer]e [employer] Lian[employer] 2021): Poor general health and/or compromised immunity;Reduced cognitive function;Frail or deconditioned;Presence of tubes (ETT, trach, NG, etc.);Aspiration of thick, dense, and/or acidic materials;Frequent aspiration of large volumes  Swallow Evaluation Recommendations Recommendations: PO diet PO Diet Recommendation: Regular;Thin liquids (Level 0) Liquid Administration via: Cup;Straw Medication Administration: Whole meds with liquid Supervision: Patient able to self-feed Swallowing strategies  : Multiple dry swallows after each bite/sip;Follow solids with liquids Postural changes: Position pt fully upright for meals;Stay upright 30-60 min after meals Oral care recommendations: Oral care BID (2x/day);Pt independent with oral care      Pat  Kendi Defalco,M.S.,CCC-SLP 03/30/2024,12:16 PM

## 2024-04-04 ENCOUNTER — Ambulatory Visit: Payer: Self-pay | Admitting: Internal Medicine

## 2024-04-06 ENCOUNTER — Other Ambulatory Visit: Payer: Self-pay | Admitting: Cardiology

## 2024-04-06 DIAGNOSIS — I10 Essential (primary) hypertension: Secondary | ICD-10-CM

## 2024-04-06 DIAGNOSIS — I2510 Atherosclerotic heart disease of native coronary artery without angina pectoris: Secondary | ICD-10-CM

## 2024-04-06 DIAGNOSIS — I2089 Other forms of angina pectoris: Secondary | ICD-10-CM

## 2024-04-12 MED ORDER — AMLODIPINE BESYLATE 5 MG PO TABS: 5.0000 mg | ORAL_TABLET | Freq: Every day | ORAL | 2 refills | Status: AC

## 2024-05-23 ENCOUNTER — Other Ambulatory Visit
